# Patient Record
Sex: Male | Born: 1960
Health system: Southern US, Community
[De-identification: ages and names within clinical notes are randomized; demographics above are authoritative.]

## PROBLEM LIST (undated history)

## (undated) DIAGNOSIS — F524 Premature ejaculation: Secondary | ICD-10-CM

## (undated) DIAGNOSIS — D179 Benign lipomatous neoplasm, unspecified: Secondary | ICD-10-CM

## (undated) DIAGNOSIS — B009 Herpesviral infection, unspecified: Secondary | ICD-10-CM

## (undated) DIAGNOSIS — E559 Vitamin D deficiency, unspecified: Secondary | ICD-10-CM

## (undated) DIAGNOSIS — F845 Asperger's syndrome: Secondary | ICD-10-CM

## (undated) DIAGNOSIS — E785 Hyperlipidemia, unspecified: Secondary | ICD-10-CM

## (undated) DIAGNOSIS — M1990 Unspecified osteoarthritis, unspecified site: Secondary | ICD-10-CM

## (undated) DIAGNOSIS — H269 Unspecified cataract: Secondary | ICD-10-CM

## (undated) DIAGNOSIS — G4730 Sleep apnea, unspecified: Secondary | ICD-10-CM

## (undated) DIAGNOSIS — C61 Malignant neoplasm of prostate: Secondary | ICD-10-CM

## (undated) DIAGNOSIS — I2699 Other pulmonary embolism without acute cor pulmonale: Secondary | ICD-10-CM

## (undated) HISTORY — PX: CATARACT EXTRACTION, BILATERAL: SHX1313

## (undated) HISTORY — DX: Unspecified osteoarthritis, unspecified site: M19.90

## (undated) HISTORY — DX: Hyperlipidemia, unspecified: E78.5

## (undated) HISTORY — DX: Herpesviral infection, unspecified: B00.9

## (undated) HISTORY — DX: Asperger's syndrome: F84.5

## (undated) HISTORY — DX: Benign lipomatous neoplasm, unspecified: D17.9

## (undated) HISTORY — DX: Malignant neoplasm of prostate: C61

## (undated) HISTORY — DX: Sleep apnea, unspecified: G47.30

## (undated) HISTORY — DX: Unspecified cataract: H26.9

## (undated) HISTORY — DX: Other pulmonary embolism without acute cor pulmonale: I26.99

## (undated) HISTORY — DX: Vitamin D deficiency, unspecified: E55.9

## (undated) HISTORY — PX: EYE SURGERY: SHX253

## (undated) HISTORY — PX: PROSTATE SURGERY: SHX751

## (undated) HISTORY — PX: OTHER SURGICAL HISTORY: SHX169

## (undated) HISTORY — DX: Premature ejaculation: F52.4

## (undated) HISTORY — PX: JOINT REPLACEMENT: SHX530

## (undated) HISTORY — PX: SHOULDER OPEN ROTATOR CUFF REPAIR: SHX2407

---

## 2003-11-08 ENCOUNTER — Encounter: Admission: RE | Admit: 2003-11-08 | Discharge: 2003-11-08 | Payer: Self-pay | Admitting: Internal Medicine

## 2004-07-27 DIAGNOSIS — F524 Premature ejaculation: Secondary | ICD-10-CM

## 2004-07-27 HISTORY — DX: Premature ejaculation: F52.4

## 2009-04-29 DIAGNOSIS — I2699 Other pulmonary embolism without acute cor pulmonale: Secondary | ICD-10-CM

## 2009-04-29 HISTORY — DX: Other pulmonary embolism without acute cor pulmonale: I26.99

## 2009-05-21 ENCOUNTER — Encounter (INDEPENDENT_AMBULATORY_CARE_PROVIDER_SITE_OTHER): Payer: Self-pay | Admitting: Urology

## 2009-05-21 ENCOUNTER — Inpatient Hospital Stay (HOSPITAL_COMMUNITY): Admission: RE | Admit: 2009-05-21 | Discharge: 2009-05-22 | Payer: Self-pay | Admitting: Urology

## 2009-05-21 HISTORY — PX: XI ROBOTIC ASSISTED SIMPLE PROSTATECTOMY: SHX6713

## 2009-06-02 ENCOUNTER — Ambulatory Visit: Payer: Self-pay | Admitting: Family Medicine

## 2009-06-02 ENCOUNTER — Observation Stay (HOSPITAL_COMMUNITY): Admission: EM | Admit: 2009-06-02 | Discharge: 2009-06-03 | Payer: Self-pay | Admitting: Family Medicine

## 2009-06-02 ENCOUNTER — Encounter: Admission: RE | Admit: 2009-06-02 | Discharge: 2009-06-02 | Payer: Self-pay | Admitting: Emergency Medicine

## 2010-03-29 HISTORY — PX: OTHER SURGICAL HISTORY: SHX169

## 2010-04-13 LAB — HM COLONOSCOPY

## 2010-06-18 LAB — CBC
HCT: 40.8 % (ref 39.0–52.0)
Hemoglobin: 14.3 g/dL (ref 13.0–17.0)
MCHC: 34.9 g/dL (ref 30.0–36.0)
Platelets: 225 10*3/uL (ref 150–400)
RBC: 4.43 MIL/uL (ref 4.22–5.81)
RDW: 12.7 % (ref 11.5–15.5)

## 2010-06-18 LAB — BASIC METABOLIC PANEL
BUN: 11 mg/dL (ref 6–23)
GFR calc Af Amer: >60 mL/min (ref >60)
Potassium: 4.4 mEq/L (ref 3.5–5.1)
Sodium: 140 mEq/L (ref 135–145)

## 2010-06-18 LAB — TYPE AND SCREEN: ABO/RH(D): A POS

## 2010-06-18 LAB — HEMOGLOBIN AND HEMATOCRIT, BLOOD
HCT: 36.8 % — ABNORMAL LOW (ref 39.0–52.0)
Hemoglobin: 13 g/dL (ref 13.0–17.0)

## 2010-06-22 LAB — BASIC METABOLIC PANEL
CO2: 29 mEq/L (ref 19–32)
GFR calc Af Amer: >60 mL/min (ref >60)
Glucose, Bld: 134 mg/dL — ABNORMAL HIGH (ref 70–99)
Potassium: 3.9 mEq/L (ref 3.5–5.1)

## 2010-06-22 LAB — CBC
HCT: 38.2 % — ABNORMAL LOW (ref 39.0–52.0)
Hemoglobin: 13.1 g/dL (ref 13.0–17.0)
Hemoglobin: 14.2 g/dL (ref 13.0–17.0)
MCHC: 34.4 g/dL (ref 30.0–36.0)
MCV: 92.5 fL (ref 78.0–100.0)
MCV: 93 fL (ref 78.0–100.0)
RBC: 4.11 MIL/uL — ABNORMAL LOW (ref 4.22–5.81)
WBC: 7.4 10*3/uL (ref 4.0–10.5)

## 2010-06-22 LAB — PROTIME-INR
INR: 1.35 (ref 0.00–1.49)
Prothrombin Time: 14.4 seconds (ref 11.6–15.2)

## 2010-06-22 LAB — DIFFERENTIAL
Eosinophils Relative: 1 % (ref 0–5)
Lymphs Abs: 1.6 10*3/uL (ref 0.7–4.0)
Monocytes Relative: 6 % (ref 3–12)
Neutro Abs: 6.2 10*3/uL (ref 1.7–7.7)
Neutrophils Relative %: 73 % (ref 43–77)

## 2010-08-31 ENCOUNTER — Encounter (HOSPITAL_COMMUNITY)
Admission: RE | Admit: 2010-08-31 | Discharge: 2010-08-31 | Disposition: A | Discharge status: Home / Self Care | Payer: BC Managed Care – PPO | Source: Ambulatory Visit | Attending: Orthopedic Surgery | Admitting: Orthopedic Surgery

## 2010-08-31 ENCOUNTER — Ambulatory Visit (HOSPITAL_COMMUNITY)
Admission: RE | Admit: 2010-08-31 | Discharge: 2010-08-31 | Disposition: A | Discharge status: Home / Self Care | Payer: BC Managed Care – PPO | Source: Ambulatory Visit | Attending: Orthopedic Surgery | Admitting: Orthopedic Surgery

## 2010-08-31 ENCOUNTER — Other Ambulatory Visit (HOSPITAL_COMMUNITY): Payer: Self-pay | Admitting: Orthopedic Surgery

## 2010-08-31 DIAGNOSIS — M169 Osteoarthritis of hip, unspecified: Secondary | ICD-10-CM

## 2010-08-31 DIAGNOSIS — Z01818 Encounter for other preprocedural examination: Secondary | ICD-10-CM | POA: Insufficient documentation

## 2010-08-31 DIAGNOSIS — Z01812 Encounter for preprocedural laboratory examination: Secondary | ICD-10-CM | POA: Insufficient documentation

## 2010-08-31 DIAGNOSIS — M1710 Unilateral primary osteoarthritis, unspecified knee: Secondary | ICD-10-CM | POA: Insufficient documentation

## 2010-08-31 DIAGNOSIS — Z01810 Encounter for preprocedural cardiovascular examination: Secondary | ICD-10-CM | POA: Insufficient documentation

## 2010-08-31 DIAGNOSIS — IMO0002 Reserved for concepts with insufficient information to code with codable children: Secondary | ICD-10-CM | POA: Insufficient documentation

## 2010-08-31 LAB — URINALYSIS, ROUTINE W REFLEX MICROSCOPIC
Bilirubin Urine: NEGATIVE
Glucose, UA: NEGATIVE mg/dL
Nitrite: NEGATIVE
Protein, ur: NEGATIVE mg/dL
Urobilinogen, UA: 0.2 mg/dL (ref 0.0–1.0)

## 2010-08-31 LAB — BASIC METABOLIC PANEL
BUN: 14 mg/dL (ref 6–23)
CO2: 28 mEq/L (ref 19–32)
Calcium: 9.6 mg/dL (ref 8.4–10.5)
Creatinine, Ser: 0.83 mg/dL (ref 0.4–1.5)
GFR calc non Af Amer: >60 mL/min (ref >60)
Glucose, Bld: 96 mg/dL (ref 70–99)

## 2010-08-31 LAB — PROTIME-INR: Prothrombin Time: 13.9 seconds (ref 11.6–15.2)

## 2010-08-31 LAB — DIFFERENTIAL
Basophils Absolute: 0 10*3/uL (ref 0.0–0.1)
Basophils Relative: 0 % (ref 0–1)
Eosinophils Absolute: 0.1 10*3/uL (ref 0.0–0.7)
Lymphs Abs: 2.1 10*3/uL (ref 0.7–4.0)

## 2010-08-31 LAB — CBC
HCT: 43.6 % (ref 39.0–52.0)
Hemoglobin: 14.8 g/dL (ref 13.0–17.0)
MCH: 30.7 pg (ref 26.0–34.0)
MCHC: 33.9 g/dL (ref 30.0–36.0)
RBC: 4.82 MIL/uL (ref 4.22–5.81)
WBC: 7.2 10*3/uL (ref 4.0–10.5)

## 2010-08-31 LAB — SURGICAL PCR SCREEN: MRSA, PCR: NEGATIVE

## 2010-09-07 ENCOUNTER — Inpatient Hospital Stay (HOSPITAL_COMMUNITY)
Admission: RE | Admit: 2010-09-07 | Discharge: 2010-09-09 | DRG: 818 | Disposition: A | Discharge status: Home / Self Care | Payer: BC Managed Care – PPO | Source: Ambulatory Visit | Attending: Orthopedic Surgery | Admitting: Orthopedic Surgery

## 2010-09-07 ENCOUNTER — Inpatient Hospital Stay (HOSPITAL_COMMUNITY): Payer: BC Managed Care – PPO

## 2010-09-07 DIAGNOSIS — Z8546 Personal history of malignant neoplasm of prostate: Secondary | ICD-10-CM

## 2010-09-07 DIAGNOSIS — M1610 Unilateral primary osteoarthritis, unspecified hip: Principal | ICD-10-CM | POA: Diagnosis present

## 2010-09-07 DIAGNOSIS — M169 Osteoarthritis of hip, unspecified: Principal | ICD-10-CM | POA: Diagnosis present

## 2010-09-07 DIAGNOSIS — Z86711 Personal history of pulmonary embolism: Secondary | ICD-10-CM

## 2010-09-07 LAB — ABO/RH: ABO/RH(D): A POS

## 2010-09-07 LAB — TYPE AND SCREEN
ABO/RH(D): A POS
Antibody Screen: NEGATIVE

## 2010-09-07 LAB — GLUCOSE, CAPILLARY: Glucose-Capillary: 148 mg/dL — ABNORMAL HIGH (ref 70–99)

## 2010-09-08 LAB — CBC
HCT: 34.5 % — ABNORMAL LOW (ref 39.0–52.0)
Hemoglobin: 11.7 g/dL — ABNORMAL LOW (ref 13.0–17.0)
MCH: 31.2 pg (ref 26.0–34.0)
MCHC: 33.9 g/dL (ref 30.0–36.0)
RDW: 13.4 % (ref 11.5–15.5)

## 2010-09-08 LAB — BASIC METABOLIC PANEL
BUN: 8 mg/dL (ref 6–23)
Calcium: 8.5 mg/dL (ref 8.4–10.5)
GFR calc Af Amer: >60 mL/min (ref >60)
GFR calc non Af Amer: >60 mL/min (ref >60)
Glucose, Bld: 130 mg/dL — ABNORMAL HIGH (ref 70–99)
Potassium: 4.2 mEq/L (ref 3.5–5.1)
Sodium: 138 mEq/L (ref 135–145)

## 2010-09-08 NOTE — Op Note (Signed)
NAMEGARI, HARTSELL           ACCOUNT NO.:  [id number]  MEDICAL RECORD NO.:  [id number]  LOCATION:  5010                         FACILITY:  [facility]  PHYSICIAN:  Feliberto Gottron. Turner Daniels, M.D.   DATE OF BIRTH:  [birth year]  DATE OF PROCEDURE:  09/07/2010 DATE OF DISCHARGE:                              OPERATIVE REPORT   PREOPERATIVE DIAGNOSIS:  End-stage arthritis of the right hip, bone-on- bone.  POSTOPERATIVE DIAGNOSIS:  End-stage arthritis of the right hip, bone-on- bone.  PROCEDURE:  Right total hip arthroplasty using DePuy 58-mm Pinnacle cup, central occluder, 40-mm metal liner, 20 x 15 x 160 x 42 S-ROM stem, 78F XXL cone, +0 40-mm metal head.  SURGEON:  Feliberto Gottron. Turner Daniels, MD  FIRST ASSISTANT:  Shirl Harris, PA-C  ANESTHESIA:  General endotracheal.  ESTIMATED BLOOD LOSS:  600 mL.  FLUID REPLACEMENT:  2 L crystalloid.  DRAINS PLACED:  None.  TOURNIQUET TIME:  None.  INDICATIONS FOR PROCEDURE:  This is a 50 year old male with end-stage arthritis of the right hip who has failed conservative measures including anti-inflammatory medicines, judicious use of narcotics, physical therapy, and x-ray showed bone-on-bone arthritic changes.  He desires elective right total hip arthroplasty to decrease pain and increase function.  Risks and benefits of surgery have been discussed, questions answered.  DESCRIPTION OF PROCEDURE:  The patient identified by armband and received preoperative IV antibiotics in the holding area at the [facility].  He was taken to operating room 5, appropriate anesthetic monitors were attached, and general endotracheal anesthesia induced with the patient in a supine position.  He was then rolled into the left lateral decubitus position, fixed there with a Stulberg Mark II pelvic clamp and the right lower extremity prepped and draped in sterile fashion from the ankle to the hemipelvis.  Time-out procedure was performed.  Skin along the lateral hip  and thigh infiltrated with 20 mL of 0.5% Marcaine and epinephrine solution and 18-cm incision allowing posterolateral approach to the hip joint was made through skin and subcutaneous tissue down to the IT band which was then cut in line with the skin incision.  This exposed the greater trochanter and small bleeders were identified and cauterized.  Cobra retractors were placed between the gluteus minimus and the superior joint capsule and quadratus femoris and the inferior hip joint capsule.  This isolated the piriformis and short external rotators which were tagged with a #2 Ethibond suture and cut off their insertion on the intertrochanteric crest.  This exposed the hip joint capsule which was developed into an acetabular based flap going from posterior-superior out over the femoral neck and exiting posterior inferior.  This flap was tagged to #2 Ethibond sutures.  The hip was flexed and internally rotated allowing dislocation of the femoral head.  Standard neck cut was performed 1 fingerbreadth above the lesser trochanter and small bleeders again identified and cauterized.  The proximal femur was then translated anteriorly levering off the anterior column with a Hohmann retractor.  A spike Cobra was placed in the cotyloid notch and a posterior inferior wing retractor placed at the junction of the ischium and the acetabulum. This allowed [place] to perform a complete labrectomy.  We then carefully  examined the acetabulum and reamed up to a 57-mm basket reamer obtaining good coverage in all quadrants.  We then selected a 58-mm Pinnacle cup after irrigating the acetabulum and hammered into place at 40 degrees of abduction and about 20 degrees of anteversion.  A central occluder was placed followed by a 40-mm metal liner.  At this point, the hip was flexed and internally rotated exposing the proximal femur which was entered with a box-cutting chisel followed by the initiating reamer and then  axial reamer, reaming starting at 10 going up to 14 mm in 1-mm increments where we just got a little bit of chatter.  We then reamed up to 15.5 obtaining good cortical chatter to full depth for the reamer and reamed partway down the 16-mm reamer.  We then conically reamed up to a 1F cone to the appropriate depth for a 42-mm base neck.  The calcar was then milled to an XXL calcar and a trial 60F XXL cone was hammered into place followed by a trial stem with a 42 base neck and same version of the calcar which was about 20 degrees.  A +0 40-mm trial head was then inserted.  The hip reduced and stability to 90 with 70 of internal rotation noted and an external rotation in extension and the external rotation to about 40 degrees, the hip could not be dislocated.  At this point, the trial components were removed.  A 60F XXL ZTT cone was hammered into place.  We then reamed again a 15.5 and a 20 x 15 x 42 x 160 S-ROM stem was hammered into place in the same version as the calcar and after it had seated, a +0 40-mm metal head was hammered onto the stem.  The hip was reduced, stability checked, and found to be excellent.  Bleeders were once again checked and the wound irrigated out with normal saline solution.  The capsular flap and short external rotators were repaired back to the intertrochanteric crest through drill holes.  The IT band was closed with running #1 Vicryl suture, the subcutaneous tissue with 0 and 2-0 undyed Vicryl suture and the skin with running interlocking 3-0 nylon suture.  A dressing of Xeroform and Mepilex was applied.  The patient was unclamped, rolled supine, awakened, extubated, and taken to the recovery room without difficulty.     Feliberto Gottron. Turner Daniels, M.D.     Ovid Curd  D:  09/07/2010  T:  09/08/2010  Job:  [id number]  Electronically Signed by Gean Birchwood M.D. on 09/08/2010 09:43:36 PM

## 2010-09-09 LAB — CBC
HCT: 32.4 % — ABNORMAL LOW (ref 39.0–52.0)
MCHC: 33.3 g/dL (ref 30.0–36.0)
MCV: 92.8 fL (ref 78.0–100.0)
Platelets: 129 10*3/uL — ABNORMAL LOW (ref 150–400)
RDW: 13.5 % (ref 11.5–15.5)

## 2011-01-05 LAB — BASIC METABOLIC PANEL: Creatinine: 0.8 mg/dL (ref <=1.3)

## 2011-01-12 LAB — BASIC METABOLIC PANEL WITH GFR
BUN: 15 mg/dL (ref 4–21)
Glucose: 87 mg/dL
Potassium: 4.1 mmol/L (ref 3.4–5.3)
Sodium: 138 mmol/L (ref 137–147)

## 2011-01-12 LAB — LIPID PANEL
Cholesterol: 261 mg/dL — AB (ref 0–200)
HDL: 43 mg/dL (ref 35–70)
LDL Cholesterol: 190 mg/dL
LDl/HDL Ratio: 6.1
Triglycerides: 140 mg/dL (ref 40–160)

## 2011-01-12 LAB — HEPATIC FUNCTION PANEL
ALT: 17 U/L (ref 10–40)
AST: 18 U/L (ref 14–40)
Alkaline Phosphatase: 70 U/L (ref 25–125)
Bilirubin, Total: 0.6 mg/dL

## 2011-01-12 LAB — CBC AND DIFFERENTIAL
HCT: 43 % (ref 41–53)
Hemoglobin: 14.3 g/dL (ref 13.5–17.5)
Platelets: 181 K/µL (ref 150–399)
WBC: 6.5 10*3/mL

## 2011-01-12 LAB — TSH: TSH: 3.29 u[IU]/mL (ref <=5.90)

## 2011-04-13 ENCOUNTER — Encounter: Payer: Self-pay | Admitting: *Deleted

## 2011-04-13 DIAGNOSIS — B009 Herpesviral infection, unspecified: Secondary | ICD-10-CM | POA: Insufficient documentation

## 2011-04-13 DIAGNOSIS — E785 Hyperlipidemia, unspecified: Secondary | ICD-10-CM | POA: Insufficient documentation

## 2011-04-13 DIAGNOSIS — E559 Vitamin D deficiency, unspecified: Secondary | ICD-10-CM | POA: Insufficient documentation

## 2011-05-11 ENCOUNTER — Ambulatory Visit (INDEPENDENT_AMBULATORY_CARE_PROVIDER_SITE_OTHER): Payer: BC Managed Care – PPO | Admitting: Emergency Medicine

## 2011-05-11 ENCOUNTER — Encounter: Payer: Self-pay | Admitting: Emergency Medicine

## 2011-05-11 VITALS — BP 121/72 | HR 69 | Temp 98.5°F | Resp 17 | Ht 74.0 in | Wt 265.6 lb

## 2011-05-11 DIAGNOSIS — E785 Hyperlipidemia, unspecified: Secondary | ICD-10-CM

## 2011-05-11 DIAGNOSIS — E782 Mixed hyperlipidemia: Secondary | ICD-10-CM

## 2011-05-11 LAB — LIPID PANEL
HDL: 38 mg/dL — ABNORMAL LOW (ref >39)
LDL Cholesterol: 137 mg/dL — ABNORMAL HIGH (ref 0–99)

## 2011-05-11 NOTE — Progress Notes (Signed)
  Subjective:    Patient ID: Scott [name], male    DOB: 06-30-1960, 51 y.o.   MRN: [id number]  HPI patient enters for recheck on his cholesterol his last cholesterol is elevated despite being on Crestor. We had him continue to take his Crestor on a daily basis. He exercises every day for about 50 minutes at school. He has no complaints of chest pain shortness of breath or any other problems. He's had a lot of difficulty with his mother and his children over some issues that arose at Christmas.    Review of Systems he denies chest pain shortness of breath problems.     Objective:   Physical Exam HEENT exam is normal. Chest is clear. Heart regular rate no murmurs.        Assessment & Plan:  Assessment is hyperlipidemia not adequately controlled on current dose of Crestor. See what his level is and if not at goal will increase his Crestor dosage. He will continue his exercise program and low fat diet

## 2011-09-14 ENCOUNTER — Encounter: Payer: Self-pay | Admitting: Emergency Medicine

## 2011-09-14 ENCOUNTER — Ambulatory Visit (INDEPENDENT_AMBULATORY_CARE_PROVIDER_SITE_OTHER): Payer: BC Managed Care – PPO | Admitting: Emergency Medicine

## 2011-09-14 VITALS — BP 123/73 | HR 70 | Temp 98.3°F | Resp 16 | Ht 74.0 in | Wt 268.0 lb

## 2011-09-14 DIAGNOSIS — E785 Hyperlipidemia, unspecified: Secondary | ICD-10-CM

## 2011-09-14 DIAGNOSIS — M5410 Radiculopathy, site unspecified: Secondary | ICD-10-CM

## 2011-09-14 MED ORDER — GABAPENTIN 300 MG PO CAPS: ORAL_CAPSULE | ORAL | Status: DC

## 2011-09-14 NOTE — Progress Notes (Signed)
  Subjective:    Patient ID: Scott [name], male    DOB: Aug 03, 1960, 51 y.o.   MRN: [id number]  HPI patient here followup on his high cholesterol. He is currently on Crestor daily. He also has a history of depression on Zoloft. He also recently injured his right great toe and developed a large subungual hematoma.    Review of Systems he also recently has had trouble with left side of his neck. He has had pain shooting down his left arm. He was told by his orthopedist he had a pinched nerve in his neck.     Objective:   Physical Exam chest is clear heart rate no murmurs. Abdomen soft nontender. There is a large subungual hematoma of the right great toe. A 20-gauge needle was used and an opening made in the nail plate. A large amount of blood and fluid was expressed.   Physical exam of the upper extremities. Deep tendon reflexes 2+. Motor strength 5 out of 5 in all muscle groups.     Assessment & Plan:  Do well on current dose of Crestor. I did in his right great nail to relieve the subungual hematoma. He will recheck in 3-4 months to recheck his cholesterol. He also has radiculopathy in his left arm. We'll try Neurontin 300 mg at bedtime. I also gave him Dr. Genene Churn number to call to see if he would benefit from cervical traction.

## 2011-09-15 LAB — LIPID PANEL
Cholesterol: 221 mg/dL — ABNORMAL HIGH (ref 0–200)
Total CHOL/HDL Ratio: 6.1 Ratio
Triglycerides: 194 mg/dL — ABNORMAL HIGH (ref <150)

## 2011-09-18 ENCOUNTER — Other Ambulatory Visit: Payer: Self-pay | Admitting: Emergency Medicine

## 2011-10-22 ENCOUNTER — Ambulatory Visit: Payer: BC Managed Care – PPO

## 2011-10-22 ENCOUNTER — Ambulatory Visit (INDEPENDENT_AMBULATORY_CARE_PROVIDER_SITE_OTHER): Payer: BC Managed Care – PPO | Admitting: Emergency Medicine

## 2011-10-22 VITALS — BP 132/74 | HR 72 | Temp 98.7°F | Resp 16 | Ht 75.75 in | Wt 268.0 lb

## 2011-10-22 DIAGNOSIS — S90212A Contusion of left great toe with damage to nail, initial encounter: Secondary | ICD-10-CM

## 2011-10-22 DIAGNOSIS — S90129A Contusion of unspecified lesser toe(s) without damage to nail, initial encounter: Secondary | ICD-10-CM

## 2011-10-22 MED ORDER — HYDROCODONE-ACETAMINOPHEN 5-325 MG PO TABS: 1.0000 | ORAL_TABLET | Freq: Four times a day (QID) | ORAL | Status: AC | PRN

## 2011-10-22 MED ORDER — DOXYCYCLINE HYCLATE 100 MG PO TABS: 100.0000 mg | ORAL_TABLET | Freq: Two times a day (BID) | ORAL | Status: AC

## 2011-10-22 MED ORDER — MUPIROCIN CALCIUM 2 % EX CREA: TOPICAL_CREAM | Freq: Three times a day (TID) | CUTANEOUS | Status: AC

## 2011-10-22 NOTE — Patient Instructions (Addendum)
Clean area with soap and water twice a day. Apply cream twice a day. Recheck in 3-4 days if not improving.

## 2011-10-22 NOTE — Progress Notes (Signed)
  Subjective:    Patient ID: Scott [name], male    DOB: Nov 25, 1960, 51 y.o.   MRN: [id number]  HPI initial injury to the great toes approximately 5 weeks ago. He developed a subungual hematoma which was drained with a 20-gauge needle. Over the last few days he has developed increased redness at the base of the nail.    Review of Systems     Objective:   Physical Exam only a small portion of the nail is still attached. After numbing with topical this area was removed. There is a new nail forming in this area . There is redness at the base of the nail matrix. The skin here is peeling    [facility] reading (PRIMARY) by  Dr Cleta Alberts there may be a tiny tuft fracture.        Assessment & Plan:  Patient appears to be developing a paronychia at the base of the nail. The nail plate was removed treat with topical appears thinned and doxycycline. He was given some hydrocodone for pain if needed

## 2012-01-31 ENCOUNTER — Other Ambulatory Visit: Payer: Self-pay | Admitting: Emergency Medicine

## 2012-02-09 ENCOUNTER — Other Ambulatory Visit: Payer: Self-pay | Admitting: *Deleted

## 2012-02-09 MED ORDER — ROSUVASTATIN CALCIUM 20 MG PO TABS: 20.0000 mg | ORAL_TABLET | Freq: Every day | ORAL | Status: DC

## 2012-04-17 ENCOUNTER — Other Ambulatory Visit: Payer: Self-pay | Admitting: Emergency Medicine

## 2012-05-15 ENCOUNTER — Other Ambulatory Visit: Payer: Self-pay | Admitting: *Deleted

## 2012-05-15 MED ORDER — ROSUVASTATIN CALCIUM 20 MG PO TABS: 20.0000 mg | ORAL_TABLET | Freq: Every day | ORAL | Status: DC

## 2012-06-19 ENCOUNTER — Encounter: Payer: Self-pay | Admitting: Emergency Medicine

## 2012-06-19 ENCOUNTER — Ambulatory Visit (INDEPENDENT_AMBULATORY_CARE_PROVIDER_SITE_OTHER): Payer: BC Managed Care – PPO | Admitting: Emergency Medicine

## 2012-06-19 VITALS — BP 120/80 | HR 86 | Temp 98.0°F | Resp 16 | Ht 75.0 in | Wt 265.0 lb

## 2012-06-19 DIAGNOSIS — E782 Mixed hyperlipidemia: Secondary | ICD-10-CM

## 2012-06-19 LAB — COMPREHENSIVE METABOLIC PANEL
ALT: 20 U/L (ref 0–53)
AST: 19 U/L (ref 0–37)
Alkaline Phosphatase: 72 U/L (ref 39–117)
CO2: 29 mEq/L (ref 19–32)
Sodium: 139 mEq/L (ref 135–145)
Total Bilirubin: 0.6 mg/dL (ref 0.3–1.2)
Total Protein: 7.1 g/dL (ref 6.0–8.3)

## 2012-06-19 LAB — POCT URINALYSIS DIPSTICK
Ketones, UA: NEGATIVE
Leukocytes, UA: NEGATIVE
Nitrite, UA: NEGATIVE
Protein, UA: NEGATIVE
Urobilinogen, UA: 0.2

## 2012-06-19 LAB — POCT CBC
Granulocyte percent: 63.5 %G (ref 37–80)
Hemoglobin: 14.2 g/dL (ref 14.1–18.1)
Lymph, poc: 1.6 (ref 0.6–3.4)
MCHC: 32.3 g/dL (ref 31.8–35.4)
MPV: 7.6 fL (ref 0–99.8)
POC Granulocyte: 3.6 (ref 2–6.9)
POC MID %: 7.7 %M (ref 0–12)
RDW, POC: 13.9 %

## 2012-06-19 LAB — LIPID PANEL
LDL Cholesterol: 161 mg/dL — ABNORMAL HIGH (ref 0–99)
Total CHOL/HDL Ratio: 5.6 Ratio
VLDL: 31 mg/dL (ref 0–40)

## 2012-06-19 LAB — TSH: TSH: 2.195 u[IU]/mL (ref 0.350–4.500)

## 2012-06-19 NOTE — Progress Notes (Addendum)
[facility] [address] [place] [address] [phone]  Date:  06/19/2012   Name:  Scott [name]   DOB:  June 09, 1960   MRN:  [id number]  PCP:  Lucilla Edin, MD    Chief Complaint: Medication Refill   History of Present Illness:  Scott [name] is a 52 y.o. very pleasant male patient who presents with the following:  For labs and crestor refill.  Tolerating medication well and no adverse effects.  Had recent PSA measured 0.00.  Denies other complaint or health concern today.  Up to date on immunizations.  Non smoker   Patient Active Problem List  Diagnosis  . Hyperlipidemia  . Unspecified vitamin D deficiency  . Herpes simplex type II infection    Past Medical History  Diagnosis Date  . Hyperlipidemia   . Premature ejaculation     07/2004  . Unspecified vitamin D deficiency   . Multiple lipomas   . Malignant neoplasm of prostate   . Pulmonary embolism     04/2009  . Asperger's syndrome     symptoms of  . Herpes simplex type II infection     Past Surgical History  Procedure Laterality Date  . Hip repalcement      2012  . Robotic prostatectomy      05/21/2009  . Shoulder open rotator [name] repair      History  Substance Use Topics  . Smoking status: Former Smoker -- 1.00 packs/day    Types: Cigarettes    Quit date: 03/29/2002  . Smokeless tobacco: Not on file  . Alcohol Use: 1.5 - 2.5 oz/week    3-5 drink(s) per week    Family History  Problem Relation Age of Onset  . Heart disease    . Hyperlipidemia    . Diabetes      ?  Marland Kitchen Diabetes Mother   . Hypertension Mother   . Rheum arthritis Mother   . Cancer Father   . Heart failure Father     "heart trouble"    No Known Allergies  Medication list has been reviewed and updated.  Current Outpatient Prescriptions on File Prior to Visit  Medication Sig Dispense Refill  . rosuvastatin (CRESTOR) 20 MG tablet Take 1 tablet (20 mg total) by mouth daily. Needs office  visit before anymore refills  15 tablet  0  . sertraline (ZOLOFT) 100 MG tablet TAKE 1 TABLET BY MOUTH EVERY DAY  30 tablet  PRN  . tadalafil (CIALIS) 5 MG tablet Take 5 mg by mouth daily as needed.      . valACYclovir (VALTREX) 500 MG tablet TAKE 1 TABLET BY MOUTH EVERY DAY  30 tablet  PRN  . cholecalciferol (VITAMIN D) 1000 UNITS tablet Take 1,000 Units by mouth daily.      Marland Kitchen gabapentin (NEURONTIN) 300 MG capsule Take one capsule at bedtime  30 capsule  3  . meloxicam (MOBIC) 15 MG tablet Take 15 mg by mouth daily.       No current facility-administered medications on file prior to visit.    Review of Systems:  As per HPI, otherwise negative.    Physical Examination: Filed Vitals:   06/19/12 0831  BP: 120/80  Pulse: 86  Temp: 98 F (36.7 C)  Resp: 16   Filed Vitals:   06/19/12 0831  Height: 6\' 3"  (1.905 m)  Weight: 265 lb (120.203 kg)   Body mass index is 33.12 kg/(m^2). Ideal Body Weight: Weight in (  lb) to have BMI = 25: 199.6  GEN: WDWN, NAD, Non-toxic, A & O x 3 HEENT: Atraumatic, Normocephalic. Neck supple. No masses, No LAD. Ears and Nose: No external deformity. CV: RRR, No M/G/R. No JVD. No thrill. No extra heart sounds. PULM: CTA B, no wheezes, crackles, rhonchi. No retractions. No resp. distress. No accessory muscle use. ABD: S, NT, ND, +BS. No rebound. No HSM. EXTR: No c/c/e NEURO Normal gait.  PSYCH: Normally interactive. Conversant. Not depressed or anxious appearing.  Calm demeanor.  RECTAL by urologist   Assessment and Plan: Hyperlipidemia Erectile dysfunction Prostate cancer by history   Signed,  Phillips Odor, MD

## 2012-06-20 ENCOUNTER — Telehealth: Payer: Self-pay

## 2012-06-20 LAB — VITAMIN D 25 HYDROXY (VIT D DEFICIENCY, FRACTURES): Vit D, 25-Hydroxy: 25 ng/mL — ABNORMAL LOW (ref 30–89)

## 2012-06-20 MED ORDER — ROSUVASTATIN CALCIUM 40 MG PO TABS: 40.0000 mg | ORAL_TABLET | Freq: Every day | ORAL | Status: DC

## 2012-06-20 MED ORDER — CHOLECALCIFEROL 1.25 MG (50000 UT) PO TABS: 1.0000 | ORAL_TABLET | ORAL | Status: DC

## 2012-06-20 NOTE — Addendum Note (Signed)
Addended by: Carmelina Dane on: 06/20/2012 06:43 PM   Modules accepted: Orders

## 2012-06-20 NOTE — Telephone Encounter (Signed)
Spoke with pt, he needs a refill on his Crestor. Can you review his labs and let me know if I can send this in. Please advise.

## 2012-06-20 NOTE — Addendum Note (Signed)
Addended by: Carmelina Dane on: 06/20/2012 06:45 PM   Modules accepted: Orders

## 2013-12-16 ENCOUNTER — Ambulatory Visit (INDEPENDENT_AMBULATORY_CARE_PROVIDER_SITE_OTHER): Payer: BC Managed Care – PPO | Admitting: Emergency Medicine

## 2013-12-16 VITALS — BP 120/76 | HR 61 | Temp 98.2°F | Resp 12 | Ht 74.5 in | Wt 252.0 lb

## 2013-12-16 DIAGNOSIS — Z7189 Other specified counseling: Secondary | ICD-10-CM

## 2013-12-16 DIAGNOSIS — R4589 Other symptoms and signs involving emotional state: Secondary | ICD-10-CM

## 2013-12-16 DIAGNOSIS — F3289 Other specified depressive episodes: Secondary | ICD-10-CM

## 2013-12-16 DIAGNOSIS — F329 Major depressive disorder, single episode, unspecified: Secondary | ICD-10-CM

## 2013-12-16 DIAGNOSIS — F32A Depression, unspecified: Secondary | ICD-10-CM

## 2013-12-16 DIAGNOSIS — R454 Irritability and anger: Secondary | ICD-10-CM

## 2013-12-16 DIAGNOSIS — Z630 Problems in relationship with spouse or partner: Secondary | ICD-10-CM

## 2013-12-16 MED ORDER — SERTRALINE HCL 100 MG PO TABS: ORAL_TABLET | ORAL | Status: DC

## 2013-12-16 MED ORDER — VALACYCLOVIR HCL 500 MG PO TABS: 500.0000 mg | ORAL_TABLET | Freq: Every day | ORAL | Status: DC

## 2013-12-16 NOTE — Progress Notes (Signed)
   Subjective:    Patient ID: Scott [name], male    DOB: 03/09/1961, 53 y.o.   MRN: [id number]  HPI 53 y.o. Male presents to clinic for a medication refill valtrex and zoloft. Reports that he has been dealing with some family issues and feels he needs a strong antidepressant. Reports that he has taken antidepressants before, but states that wife made him quit because it made him to lethargic. Reports that it has been a few years. Has noticed himself lashing out; feels that everything has just hit him the wrong way. Denies ever being threatning to his wife. Denies any feelings of being suicidal. Has seen Scott [name] in the past for these problems; has taken zoloft in the past. Reports that he was told he may have a trace of Asperger's.  Reports that he is a drinker and drinks a few a night. Reports never thinking he was a alcoholic. Denies drinking in the morning; states that it bothers him when his wife brings up his drinking.Reports that it doesn't bother him to not have alcohol. Feels like he is sixth or seventh on the totum pole at home with his wife behind her children.  Review of Systems     Objective:   Physical Exam  Constitutional: He is oriented to person, place, and time. He appears well-developed and well-nourished.  Eyes: Pupils are equal, round, and reactive to light.  Neck: No thyromegaly present.  Cardiovascular: Normal rate and regular rhythm.   Pulmonary/Chest: Effort normal and breath sounds normal. No respiratory distress. He has no wheezes.  Abdominal: Soft. Bowel sounds are normal. There is no tenderness.  Musculoskeletal: Normal range of motion.  Neurological: He is alert and oriented to person, place, and time. No cranial nerve deficit.  Psychiatric: He has a normal mood and affect. His behavior is normal. Judgment and thought content normal.   Beck depression 15 Meds ordered this encounter  Medications  . valACYclovir (VALTREX) 500 MG tablet    Sig: Take 1  tablet (500 mg total) by mouth daily.    Dispense:  30 tablet    Refill:  11  . sertraline (ZOLOFT) 100 MG tablet    Sig: Take one half tablet daily for one week then one tablet daily    Dispense:  30 tablet    Refill:  11        Assessment & Plan:  Patient having diffcult with his marriage, will resume zoloft he has taken in the past. Make referall to Scott stewart for counseling. Recheck in three weeks.

## 2014-03-05 ENCOUNTER — Ambulatory Visit (INDEPENDENT_AMBULATORY_CARE_PROVIDER_SITE_OTHER): Payer: BC Managed Care – PPO | Admitting: Emergency Medicine

## 2014-03-05 ENCOUNTER — Encounter: Payer: Self-pay | Admitting: Emergency Medicine

## 2014-03-05 ENCOUNTER — Ambulatory Visit (INDEPENDENT_AMBULATORY_CARE_PROVIDER_SITE_OTHER): Payer: BC Managed Care – PPO

## 2014-03-05 VITALS — BP 140/80 | HR 84 | Temp 98.6°F | Resp 16 | Ht 74.5 in | Wt 264.0 lb

## 2014-03-05 DIAGNOSIS — L989 Disorder of the skin and subcutaneous tissue, unspecified: Secondary | ICD-10-CM

## 2014-03-05 DIAGNOSIS — N529 Male erectile dysfunction, unspecified: Secondary | ICD-10-CM

## 2014-03-05 DIAGNOSIS — M1612 Unilateral primary osteoarthritis, left hip: Secondary | ICD-10-CM

## 2014-03-05 DIAGNOSIS — M25552 Pain in left hip: Secondary | ICD-10-CM

## 2014-03-05 DIAGNOSIS — E785 Hyperlipidemia, unspecified: Secondary | ICD-10-CM

## 2014-03-05 DIAGNOSIS — Z0000 Encounter for general adult medical examination without abnormal findings: Secondary | ICD-10-CM

## 2014-03-05 LAB — POCT URINALYSIS DIPSTICK
Bilirubin, UA: NEGATIVE
Blood, UA: NEGATIVE
Glucose, UA: NEGATIVE
Ketones, UA: NEGATIVE
Leukocytes, UA: NEGATIVE
NITRITE UA: NEGATIVE
PROTEIN UA: NEGATIVE
SPEC GRAV UA: 1.015
UROBILINOGEN UA: 1
pH, UA: 5.5

## 2014-03-05 MED ORDER — TADALAFIL 5 MG PO TABS
5.0000 mg | ORAL_TABLET | Freq: Every day | ORAL | Status: DC | PRN
Start: 2014-03-05 — End: 2020-02-04

## 2014-03-05 MED ORDER — ROSUVASTATIN CALCIUM 40 MG PO TABS: 40.0000 mg | ORAL_TABLET | Freq: Every day | ORAL | Status: DC

## 2014-03-05 NOTE — Progress Notes (Addendum)
MRN: [id number]  Subjective:   [name] is a 53 y.o. male presenting for annual physical exam and hip pain.   Medical care team includes:  PCP: Jenny Reichmann, MD, last visit 11/2013 Vision: Cannot recall ophtalmologist, last seen 09/2013, had bilateral cataract surgery 06-09/2013, has done well since Dental: Cleanings biannually, last seen 2 weeks ago, no new significant findings. Specialists:   Colonoscopy: Dr. Benson Norway, done in 2010, no polyps, 10 year follow up.  Urologist, Dr. Alinda Money: last seen 08/2013, PSA was undetectable then, follow up in 04/2014, if PSA is normal, will stop rechecks until 5 years.  [name] has Hyperlipidemia; Unspecified vitamin D deficiency; Herpes simplex type II infection; and Anger on his problem list.  Mood - started Zoloft 11/2013, seeing Vivia Budge, therapist in addition to antidepressant medication. Patient reports significant improvement in his mood, feels that Zoloft is working for him. States that he is getting along better with his wife, no HI or SI.   HL - ran out Crestor a couple of weeks ago. Reports compliance until then. Needs a refill today. Diet is okay, dinner is very healthy, lunch consists of unhealthy meals, burgers, sometimes salads. Goes to gym 4-5 times a week for at least 45 minutes.  Herpes - had an outbreak ~1 month ago, took Valtrex, shortened outbreak to ~1.5 days. Patient rarely has outbreaks, reports that Valtrex helps resolve symptoms quickly.  ED - taking Cialis, uses this infrequently but works well for patient. Would like a refill today.  Hip pain - reports several week history of new onset intermittent left sided sharp hip pain, moderate in severity, non-radiating, lasting <10 minutes, worse with increased activity including work and exercise. Previously had right hip replacement (2012), left hip pain feels exactly same as previous right hip pain.   Health Maintenance: flu vaccine 11/2013,  last tetanus 2008. 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 States that he had blood drawn today as part of faculty wellness program, would like to fax those results instead of having labs redrawn.   Denies any other questions or concerns.  Current medications include: Zoloft 100mg  daily Cialis 5mg  PRN Valtrex 500mg  TID PRN  [name] has No Known Allergies.   Past Medical History  Diagnosis Date  . Hyperlipidemia   . Premature ejaculation     07/2004  . Unspecified vitamin D deficiency   . Multiple lipomas   . Malignant neoplasm of prostate   . Pulmonary embolism     04/2009  . Asperger's syndrome     symptoms of  . Herpes simplex type II infection     Past Surgical History  Procedure Laterality Date  . Hip repalcement      2012  . Robotic prostatectomy      05/21/2009  . Shoulder open rotator cuff repair    . Cataract extraction, bilateral      Review of Systems  Constitutional: Negative for fever, chills and weight loss.  HENT: Negative for sore throat.   Eyes: Negative for double vision.  Respiratory: Negative for cough, shortness of breath and wheezing.   Cardiovascular: Negative for chest pain, palpitations, leg swelling and PND.  Gastrointestinal: Negative for nausea, vomiting, abdominal pain, diarrhea, constipation and blood in stool.  Genitourinary: Negative for dysuria, hematuria and flank pain.  Musculoskeletal: Positive for joint pain (as in HPI). Negative for myalgias and back pain.  Skin: Negative for rash.  Neurological: Negative for dizziness  and headaches.  Psychiatric/Behavioral: Negative for depression. The patient does not have insomnia.     Objective:   Vitals: BP 140/80 mmHg  Pulse 84  Temp(Src) 98.6 F (37 C) (Oral)  Resp 16  Ht 6' 2.5" (1.892 m)  Wt 264 lb (119.75 kg)  BMI 33.45 kg/m2  SpO2 94%  Wt Readings from Last 3 Encounters:  03/05/14 264 lb (119.75 kg)  12/16/13 252 lb (114.306 kg)  06/19/12  265 lb (120.203 kg)   Physical Exam  Constitutional: He is oriented to person, place, and time and well-developed, well-nourished, and in no distress.  HENT:  Right Ear: Tympanic membrane, external ear and ear canal normal.  Left Ear: Tympanic membrane, external ear and ear canal normal.  Nose: Nose normal. No sinus tenderness.  Mouth/Throat: Oropharynx is clear and moist. No oropharyngeal exudate.  Eyes: Conjunctivae and EOM are normal. Pupils are equal, round, and reactive to light. No scleral icterus.  Neck: Normal range of motion. Neck supple. No thyromegaly present.  Cardiovascular: Normal rate, regular rhythm, normal heart sounds and intact distal pulses.  Exam reveals no gallop and no friction rub.   No murmur heard. Pulmonary/Chest: Effort normal and breath sounds normal. No stridor. No respiratory distress. He has no wheezes. He has no rales.  Abdominal: Soft. Bowel sounds are normal. He exhibits no distension and no mass. There is no tenderness.  Genitourinary:  Patient declined, followed by Urologist, Dr. Alinda Money  Musculoskeletal: He exhibits no edema or tenderness.       Left hip: He exhibits decreased range of motion (external rotation). He exhibits normal strength, no tenderness, no bony tenderness, no swelling, no crepitus and no deformity.  Lymphadenopathy:    He has no cervical adenopathy.  Neurological: He is alert and oriented to person, place, and time. He has normal reflexes.  Skin: Skin is warm and dry. Lesion (macular pigmented lesion with irregular borders ~0.5cm in size at angle of right jaw) noted. No rash noted. He is not diaphoretic. No erythema.  Psychiatric: Mood and affect normal.   [facility] reading (PRIMARY) by  Dr. Everlene Farrier and PA-Mani. Left Hip X-ray: significant degenerative changes, loss of joint space. No evidence of fracture or dislocation.  Results for orders placed or performed in visit on 03/05/14 (from the past 24 hour(s))  POCT urinalysis dipstick      Status: None   Collection Time: 03/05/14  4:04 PM  Result Value Ref Range   Color, UA Yellow    Clarity, UA Clear    Glucose, UA Negative    Bilirubin, UA Negative    Ketones, UA Negative    Spec Grav, UA 1.015    Blood, UA Negative    pH, UA 5.5    Protein, UA Negative    Urobilinogen, UA 1.0    Nitrite, UA Negative    Leukocytes, UA Negative     Assessment and Plan :   1. Annual physical exam - Medically stable, patient to send lab results this week - Discussed healthy lifestyle, diet, exercise, preventative care, vaccinations, and addressed patient's concerns - Plan for follow up in 1 year for annual exam.  - POCT urinalysis dipstick  2. Osteoarthritis of left hip, unspecified osteoarthritis type 3. Left hip pain - Ambulatory referral to Orthopedic Surgery - DG Hip Complete Left with significant degenerative changes - f/u as needed following referral completed  4. Hyperlipidemia - stable, labs to be sent by patient - restart rosuvastatin (CRESTOR) 40 MG tablet; Take 1 tablet (  40 mg total) by mouth daily.  5. Erectile dysfunction, unspecified erectile dysfunction type - stable, refill tadalafil (CIALIS) 5 MG tablet; Take 1 tablet (5 mg total) by mouth daily as needed.  Dispense: 10 tablet; Refill: 11  6. Skin lesion of face - Ambulatory referral to Dermatology for further evaluation - f/u as needed after referral completed  7. History of prostate cancer - Continue f/u with Urology   Jaynee Eagles, PA-C [facility] [phone] 03/05/2014 5:02 PM

## 2014-03-05 NOTE — Patient Instructions (Signed)
Please remember to fax the results for labs drawn through your faculty wellness program at [facility].  You should be getting a call regarding the referral to [employer]. Please let us know if they do not contact you in the next 2 weeks.   Osteoarthritis Osteoarthritis is a disease that causes soreness and inflammation of a joint. It occurs when the cartilage at the affected joint wears down. Cartilage acts as a cushion, covering the ends of bones where they meet to form a joint. Osteoarthritis is the most common form of arthritis. It often occurs in older people. The joints affected most often by this condition include those in the:  Ends of the fingers.  Thumbs.  Neck.  Lower back.  Knees.  Hips. CAUSES  Over time, the cartilage that covers the ends of bones begins to wear away. This causes bone to rub on bone, producing pain and stiffness in the affected joints.  RISK FACTORS Certain factors can increase your chances of having osteoarthritis, including:  Older age.  Excessive body weight.  Overuse of joints.  Previous joint injury. SIGNS AND SYMPTOMS   Pain, swelling, and stiffness in the joint.  Over time, the joint may lose its normal shape.  Small deposits of bone (osteophytes) may grow on the edges of the joint.  Bits of bone or cartilage can break off and float inside the joint space. This may cause more pain and damage. DIAGNOSIS  Your health care provider will do a physical exam and ask about your symptoms. Various tests may be ordered, such as:  X-rays of the affected joint.  An MRI scan.  Blood tests to rule out other types of arthritis.  Joint fluid tests. This involves using a needle to draw fluid from the joint and examining the fluid under a microscope. TREATMENT  Goals of treatment are to control pain and improve joint function. Treatment plans may include:  A prescribed exercise program that allows for rest and joint relief.  A weight  control plan.  Pain relief techniques, such as:  Properly applied heat and cold.  Electric pulses delivered to nerve endings under the skin (transcutaneous electrical nerve stimulation [TENS]).  Massage.  Certain nutritional supplements.  Medicines to control pain, such as:  Acetaminophen.  Nonsteroidal anti-inflammatory drugs (NSAIDs), such as naproxen.  Narcotic or central-acting agents, such as tramadol.  Corticosteroids. These can be given orally or as an injection.  Surgery to reposition the bones and relieve pain (osteotomy) or to remove loose pieces of bone and cartilage. Joint replacement may be needed in advanced states of osteoarthritis. HOME CARE INSTRUCTIONS   Take medicines only as directed by your health care provider.  Maintain a healthy weight. Follow your health care provider's instructions for weight control. This may include dietary instructions.  Exercise as directed. Your health care provider can recommend specific types of exercise. These may include:  Strengthening exercises. These are done to strengthen the muscles that support joints affected by arthritis. They can be performed with weights or with exercise bands to add resistance.  Aerobic activities. These are exercises, such as brisk walking or low-impact aerobics, that get your heart pumping.  Range-of-motion activities. These keep your joints limber.  Balance and agility exercises. These help you maintain daily living skills.  Rest your affected joints as directed by your health care provider.  Keep all follow-up visits as directed by your health care provider. SEEK MEDICAL CARE IF:   Your skin turns red.  You develop a  rash in addition to your joint pain.  You have worsening joint pain.  You have a fever along with joint or muscle aches. SEEK IMMEDIATE MEDICAL CARE IF:  You have a significant loss of weight or appetite.  You have night sweats. [place] of Arthritis and Musculoskeletal and Skin Diseases: www.niams.SouthExposed.es  [employer] on Aging: http://kim-miller.com/  American College of Rheumatology: www.rheumatology.org Document Released: 03/15/2005 Document Revised: 07/30/2013 Document Reviewed: 11/20/2012 [facility] Patient Information 2015 [place], [place]. This information is not intended to replace advice given to you by your health care provider. Make sure you discuss any questions you have with your health care provider.

## 2014-03-06 ENCOUNTER — Telehealth: Payer: Self-pay

## 2014-03-06 NOTE — Telephone Encounter (Signed)
Dr. Everlene Farrier please advise if rx should be for a qty of 30 instead of 10.

## 2014-03-06 NOTE — Telephone Encounter (Signed)
Patient says he normally gets 30 tablets of Cialis and pays $40. However, he was only rx 10 tablets for the same price. He wants to know why only 10 tablets were rx

## 2014-03-07 NOTE — Telephone Encounter (Signed)
It would be fine to change him to 30

## 2014-03-07 NOTE — Telephone Encounter (Signed)
Called pharmacy and pt picked up #30 on 12/9

## 2014-03-10 ENCOUNTER — Telehealth: Payer: Self-pay

## 2014-03-10 NOTE — Telephone Encounter (Signed)
Left message on machine to call back  Received labs from [facility]. Pt's Total chol was 377 and his LDL was 304. Pt is on Crestor 40 mg qd. Make sure he is taking qd. If not, stress to him the importance of this. If so, let Dr. Everlene Farrier know. All of the rest of his labs are normal.  Labs at [facility]. Will send to be scanned once pt is contacted.

## 2014-03-11 NOTE — Telephone Encounter (Signed)
Pt advised of lab work. He has just started the Crestor 40mg  daily now. Pt advised to recheck labs in 2-3 months.

## 2014-04-08 ENCOUNTER — Other Ambulatory Visit: Payer: Self-pay | Admitting: Physician Assistant

## 2014-08-08 ENCOUNTER — Ambulatory Visit (INDEPENDENT_AMBULATORY_CARE_PROVIDER_SITE_OTHER): Payer: BLUE CROSS/BLUE SHIELD | Admitting: Emergency Medicine

## 2014-08-08 ENCOUNTER — Encounter: Payer: Self-pay | Admitting: Emergency Medicine

## 2014-08-08 VITALS — BP 120/70 | HR 67 | Temp 98.2°F | Resp 16 | Ht 74.5 in | Wt 264.4 lb

## 2014-08-08 DIAGNOSIS — E785 Hyperlipidemia, unspecified: Secondary | ICD-10-CM

## 2014-08-08 DIAGNOSIS — R635 Abnormal weight gain: Secondary | ICD-10-CM

## 2014-08-08 DIAGNOSIS — R5382 Chronic fatigue, unspecified: Secondary | ICD-10-CM

## 2014-08-08 DIAGNOSIS — R454 Irritability and anger: Secondary | ICD-10-CM

## 2014-08-08 DIAGNOSIS — R1907 Generalized intra-abdominal and pelvic swelling, mass and lump: Secondary | ICD-10-CM | POA: Diagnosis not present

## 2014-08-08 LAB — CBC WITH DIFFERENTIAL/PLATELET
Basophils Absolute: 0 10*3/uL (ref 0.0–0.1)
Basophils Relative: 0 % (ref 0–1)
Eosinophils Absolute: 0.1 10*3/uL (ref 0.0–0.7)
Eosinophils Relative: 1 % (ref 0–5)
HEMATOCRIT: 45.2 % (ref 39.0–52.0)
HEMOGLOBIN: 15.1 g/dL (ref 13.0–17.0)
LYMPHS PCT: 32 % (ref 12–46)
Lymphs Abs: 1.8 10*3/uL (ref 0.7–4.0)
MCH: 31.1 pg (ref 26.0–34.0)
MCHC: 33.4 g/dL (ref 30.0–36.0)
MCV: 93 fL (ref 78.0–100.0)
MONOS PCT: 9 % (ref 3–12)
MPV: 9.3 fL (ref 8.6–12.4)
Monocytes Absolute: 0.5 10*3/uL (ref 0.1–1.0)
NEUTROS ABS: 3.2 10*3/uL (ref 1.7–7.7)
Neutrophils Relative %: 58 % (ref 43–77)
Platelets: 198 10*3/uL (ref 150–400)
RBC: 4.86 MIL/uL (ref 4.22–5.81)
RDW: 13.2 % (ref 11.5–15.5)
WBC: 5.5 10*3/uL (ref 4.0–10.5)

## 2014-08-08 LAB — COMPLETE METABOLIC PANEL WITH GFR
ALK PHOS: 73 U/L (ref 39–117)
ALT: 20 U/L (ref 0–53)
AST: 17 U/L (ref 0–37)
Albumin: 4.3 g/dL (ref 3.5–5.2)
BILIRUBIN TOTAL: 0.9 mg/dL (ref 0.2–1.2)
BUN: 15 mg/dL (ref 6–23)
CALCIUM: 9.3 mg/dL (ref 8.4–10.5)
CO2: 27 mEq/L (ref 19–32)
CREATININE: 0.87 mg/dL (ref 0.50–1.35)
Chloride: 103 mEq/L (ref 96–112)
GFR, Est Non African American: >89 mL/min
GLUCOSE: 94 mg/dL (ref 70–99)
Potassium: 4.6 mEq/L (ref 3.5–5.3)
Sodium: 138 mEq/L (ref 135–145)
Total Protein: 7 g/dL (ref 6.0–8.3)

## 2014-08-08 LAB — TSH: TSH: 1.178 u[IU]/mL (ref 0.350–4.500)

## 2014-08-08 LAB — T4, FREE: Free T4: 0.8 ng/dL (ref 0.80–1.80)

## 2014-08-08 LAB — IFOBT (OCCULT BLOOD): IMMUNOLOGICAL FECAL OCCULT BLOOD TEST: NEGATIVE

## 2014-08-08 NOTE — Progress Notes (Addendum)
Subjective:  This chart was scribed for [name], MD by Ladene Artist, ED Scribe. The patient was seen in room 23. Patient's care was started at 11:49 AM.   Patient ID: Scott [name], male    DOB: 10/03/1960, 54 y.o.   MRN: [id number]  Chief Complaint  Patient presents with  . Weight Gain    per patient since Thanksgiving 01/2050  . Bloated    no energy   HPI HPI Comments: Scott [name] is a 54 y.o. male, with a h/o hyperlipidemia, Asperger's syndrome, who presents to the [facility] complaining of weight gain over the past 6-8 months. Pt suspects a 20 lb weight gain since December. Per previous charts, pt was 252 lb on 12/16/13 and 264 lb on 03/06/15. Pt states that he exercises almost every day and walks while golfing. Pt is still taking Zoloft.   Bloating Pt reports feeling bloated "all the time". He does not recall his last solid BM; describes his BMs as liquid or similar to "chocolate pudding". He reports associated fatigue and decreased sexual desire. He denies abdominal pain and changes to diet other than recently eating gluten free. Pt's last colonoscopy was 4 years ago. No family h/o abdominal complications.   Prostate  Pt reports that urologist Raynelle Bring did pt's robotic prostatectomy in 04/2009. Pt states that his testosterone level was on the low side of normal at that time.   Melanoma Pt had a melanoma removed from his R jaw since his last visit. No concerns at this time.   Pt reports that things are going well at work. They just started final exams today.   Past Medical History  Diagnosis Date  . Hyperlipidemia   . Premature ejaculation     07/2004  . Unspecified vitamin D deficiency   . Multiple lipomas   . Malignant neoplasm of prostate   . Pulmonary embolism     04/2009  . Asperger's syndrome     symptoms of  . Herpes simplex type II infection    Current Outpatient Prescriptions on File Prior to Visit  Medication Sig  Dispense Refill  . rosuvastatin (CRESTOR) 40 MG tablet Take 1 tablet (40 mg total) by mouth daily. Needs office visit before anymore refills 30 tablet 5  . sertraline (ZOLOFT) 100 MG tablet Take one half tablet daily for one week then one tablet daily 30 tablet 11  . tadalafil (CIALIS) 5 MG tablet Take 1 tablet (5 mg total) by mouth daily as needed. 10 tablet 11  . valACYclovir (VALTREX) 500 MG tablet Take 1 tablet (500 mg total) by mouth daily. 30 tablet 11   No current facility-administered medications on file prior to visit.   No Known Allergies  Review of Systems  Constitutional: Positive for fatigue.  Gastrointestinal: Positive for abdominal distention. Negative for abdominal pain.   BP 120/70 mmHg  Pulse 67  Temp(Src) 98.2 F (36.8 C) (Oral)  Resp 16  Ht 6' 2.5" (1.892 m)  Wt 264 lb 6.4 oz (119.931 kg)  BMI 33.50 kg/m2  SpO2 96%    Objective:   Physical Exam  Constitutional: He is oriented to person, place, and time. He appears well-developed and well-nourished. No distress.  HENT:  Head: Normocephalic and atraumatic.  Eyes: Conjunctivae and EOM are normal.  Neck: Neck supple. No tracheal deviation present.  Cardiovascular: Normal rate.   Pulmonary/Chest: Effort normal. No respiratory distress.  Genitourinary: Rectum normal.  Surgically absent prostate. No rectal masses.  Musculoskeletal: Normal range of motion.  Neurological: He is alert and oriented to person, place, and time.  Skin: Skin is warm and dry.  Psychiatric: He has a normal mood and affect. His behavior is normal.  Nursing note and vitals reviewed.  Results for orders placed or performed in visit on 08/08/14  IFOBT POC (occult bld, rslt in office)  Result Value Ref Range   IFOBT Negative       Assessment & Plan:  I suspect his sensation weight gain is related to his Zoloft. His weight was 252 in September but up to 264 in December. His weight is stable since that time. We'll go ahead and CT the  abdomen and pelvis to rule out acidic fluid. I suspect the weight gain is related to his Zoloft. It may have leveled off. This has helped his anger feelings and I don't want to discontinue it.I personally performed the services described in this documentation, which was scribed in my presence. The recorded information has been reviewed and is accurate.  Nena Jordan, MD

## 2014-08-09 LAB — TESTOSTERONE: Testosterone: 273 ng/dL — ABNORMAL LOW (ref 300–890)

## 2014-08-16 ENCOUNTER — Ambulatory Visit
Admission: RE | Admit: 2014-08-16 | Discharge: 2014-08-16 | Disposition: A | Discharge status: Home / Self Care | Payer: BLUE CROSS/BLUE SHIELD | Source: Ambulatory Visit | Attending: Emergency Medicine | Admitting: Emergency Medicine

## 2014-08-16 DIAGNOSIS — R1907 Generalized intra-abdominal and pelvic swelling, mass and lump: Secondary | ICD-10-CM

## 2014-08-16 DIAGNOSIS — R635 Abnormal weight gain: Secondary | ICD-10-CM

## 2014-08-16 MED ORDER — IOPAMIDOL (ISOVUE-300) INJECTION 61%
125.0000 mL | Freq: Once | INTRAVENOUS | Status: AC | PRN
  Administered 2014-08-16: 125 mL via INTRAVENOUS

## 2014-12-28 HISTORY — PX: KNEE ARTHROSCOPY W/ MENISCAL REPAIR: SHX1877

## 2014-12-30 ENCOUNTER — Other Ambulatory Visit: Payer: Self-pay | Admitting: Urgent Care

## 2014-12-31 ENCOUNTER — Other Ambulatory Visit: Payer: Self-pay | Admitting: Emergency Medicine

## 2014-12-31 ENCOUNTER — Encounter: Payer: Self-pay | Admitting: Emergency Medicine

## 2015-01-05 ENCOUNTER — Ambulatory Visit (INDEPENDENT_AMBULATORY_CARE_PROVIDER_SITE_OTHER): Payer: BLUE CROSS/BLUE SHIELD | Admitting: Emergency Medicine

## 2015-01-05 ENCOUNTER — Ambulatory Visit (INDEPENDENT_AMBULATORY_CARE_PROVIDER_SITE_OTHER): Payer: BLUE CROSS/BLUE SHIELD

## 2015-01-05 VITALS — BP 128/80 | HR 97 | Temp 98.6°F | Resp 16 | Ht 76.0 in | Wt 277.0 lb

## 2015-01-05 DIAGNOSIS — M25562 Pain in left knee: Secondary | ICD-10-CM

## 2015-01-05 MED ORDER — MELOXICAM 15 MG PO TABS: 15.0000 mg | ORAL_TABLET | Freq: Every day | ORAL | Status: DC

## 2015-01-05 MED ORDER — HYDROCODONE-ACETAMINOPHEN 5-325 MG PO TABS: 1.0000 | ORAL_TABLET | Freq: Four times a day (QID) | ORAL | Status: DC | PRN

## 2015-01-05 NOTE — Progress Notes (Signed)
This chart was scribed for Scott Queen, MD by Leandra Kern, Medical Scribe. This patient was seen in Room 8 and the patient's care was started at 11:46 AM.  Chief Complaint:  Chief Complaint  Patient presents with  . Knee Pain    left, x 9-10 days     HPI: JAHEEM [name] is a 54 y.o. male who reports to [facility] today complaining of left knee pain, onset 9 days ago.  Pt reports that he was playing golf, and suddenly he started experiencing a sudden onset of pain that gradually increased in severity. Pt states that the tendons in the area feel tight, and that pain alleviates when he performs physical activity such as standing and walking. Pt reports that he uses ice packs for the symptoms. Pt states that he had knee complications when he was a teenager, however he denies experiencing any other problems since then.   Pt's orthopedic physician is Dr. Mayer Camel.   Past Medical History  Diagnosis Date  . Hyperlipidemia   . Premature ejaculation     07/2004  . Unspecified vitamin D deficiency   . Multiple lipomas   . Malignant neoplasm of prostate ([place])   . Pulmonary embolism ([place])     04/2009  . Asperger's syndrome     symptoms of  . Herpes simplex type II infection    Past Surgical History  Procedure Laterality Date  . Hip repalcement      2012  . Robotic prostatectomy      05/21/2009  . Shoulder open rotator cuff repair    . Cataract extraction, bilateral     Social History   Social History  . Marital Status: Married    Spouse Name: N/A  . Number of Children: N/A  . Years of Education: N/A   Social History Main Topics  . Smoking status: Former Smoker -- 1.00 packs/day    Types: Cigarettes    Quit date: 03/29/2002  . Smokeless tobacco: Never Used  . Alcohol Use: 1.5 - 2.5 oz/week    3-5 drink(s) per week  . Drug Use: No  . Sexual Activity: Not Asked   Other Topics Concern  . None   Social History Narrative   Family History  Problem Relation Age of Onset   . Heart disease    . Hyperlipidemia    . Diabetes      ?  Marland Kitchen Diabetes Mother   . Hypertension Mother   . Rheum arthritis Mother   . Cancer Father   . Heart failure Father     "heart trouble"   No Known Allergies Prior to Admission medications   Medication Sig Start Date End Date Taking? Authorizing Provider  rosuvastatin (CRESTOR) 40 MG tablet TAKE 1 TABLET BY MOUTH DAILY (NO MORE REFILLS WITHOUT OFFICE VISIT) 12/31/14   Chelle Jeffery, PA-C  sertraline (ZOLOFT) 100 MG tablet TAKE 1/2 TABLET BY MOUTH EVERY DAY FOR 7 WEEK, THEN 1 TABLET BY MOUTH EVERY DAY.  "OFFICE VISIT NEEDED FOR REFILLS" 01/01/15   Chelle Jeffery, PA-C  tadalafil (CIALIS) 5 MG tablet Take 1 tablet (5 mg total) by mouth daily as needed. 03/05/14   Jaynee Eagles, PA-C  valACYclovir (VALTREX) 500 MG tablet Take 1 tablet (500 mg total) by mouth daily. 12/16/13   [name], MD     ROS: The patient has arthralgia.   All other systems have been reviewed and were otherwise negative with the exception of those mentioned in the HPI and  as above.    PHYSICAL EXAM: Filed Vitals:   01/05/15 1115  BP: 128/80  Pulse: 97  Temp: 98.6 F (37 C)  Resp: 16   Body mass index is 33.73 kg/(m^2).   General: Alert, no acute distress HEENT:  Normocephalic, atraumatic, oropharynx patent. Eye: Juliette Mangle [facility] Cardiovascular:  Regular rate and rhythm, no rubs murmurs or gallops.  No Carotid bruits, radial pulse intact. No pedal edema.  Respiratory: Clear to auscultation bilaterally.  No wheezes, rales, or rhonchi.  No cyanosis, no use of accessory musculature Abdominal: No organomegaly, abdomen is soft and non-tender, positive bowel sounds.  No masses. Musculoskeletal: Gait intact. No edema. Joint effusion in the left knee is present. McMurray test was positive for left knee joint. Ligaments are intact. There is a mild joint effusion.  Skin: No rashes. Neurologic: Facial musculature symmetric. Psychiatric: Patient acts appropriately  throughout our interaction. Lymphatic: No cervical or submandibular lymphadenopathy   LABS:    EKG/XRAY:   Primary read interpreted by Dr. Everlene Farrier at [facility]. Knee films show very mild degenerative changes   ASSESSMENT/PLAN: I placed patient on Mobic 15 one a day. MRI ordered. Referral to orthopedics if internal derangement noted on MRI.I personally performed the services described in this documentation, which was scribed in my presence. The recorded information has been reviewed and is accurate.  By signing my name below, I, Rawaa Al Rifaie, attest that this documentation has been prepared under the direction and in the presence of Scott Queen, MD.  Leandra Kern, Medical Scribe. 01/05/2015.  11:53 AM.     [name] sideeffects, risk and benefits, and alternatives of medications d/w patient. Patient is aware that all medications have potential sideeffects and we are unable to predict every sideeffect or drug-drug interaction that may occur.  Scott Queen MD 01/05/2015 11:46 AM

## 2015-01-05 NOTE — Patient Instructions (Signed)

## 2015-01-17 ENCOUNTER — Ambulatory Visit
Admission: RE | Admit: 2015-01-17 | Discharge: 2015-01-17 | Disposition: A | Discharge status: Home / Self Care | Payer: BLUE CROSS/BLUE SHIELD | Source: Ambulatory Visit | Attending: Emergency Medicine | Admitting: Emergency Medicine

## 2015-01-17 DIAGNOSIS — M25562 Pain in left knee: Secondary | ICD-10-CM

## 2015-01-18 ENCOUNTER — Other Ambulatory Visit: Payer: Self-pay | Admitting: Physician Assistant

## 2015-01-18 DIAGNOSIS — S83207A Unspecified tear of unspecified meniscus, current injury, left knee, initial encounter: Secondary | ICD-10-CM

## 2015-01-20 ENCOUNTER — Other Ambulatory Visit: Payer: BLUE CROSS/BLUE SHIELD

## 2015-01-20 ENCOUNTER — Telehealth: Payer: Self-pay

## 2015-01-20 NOTE — Telephone Encounter (Signed)
Pt would like to have ortho referral sent to Dr. Frederik Pear at [facility]. Dr. Mayer Camel has done a previous hip surgery on pt.

## 2015-01-23 ENCOUNTER — Other Ambulatory Visit: Payer: Self-pay | Admitting: Physician Assistant

## 2015-01-24 ENCOUNTER — Other Ambulatory Visit: Payer: BLUE CROSS/BLUE SHIELD

## 2015-01-28 ENCOUNTER — Other Ambulatory Visit: Payer: Self-pay | Admitting: Emergency Medicine

## 2015-01-29 ENCOUNTER — Ambulatory Visit (INDEPENDENT_AMBULATORY_CARE_PROVIDER_SITE_OTHER): Payer: BLUE CROSS/BLUE SHIELD | Admitting: Family Medicine

## 2015-01-29 VITALS — BP 118/82 | HR 87 | Temp 98.6°F | Resp 18 | Ht 76.0 in | Wt 279.0 lb

## 2015-01-29 DIAGNOSIS — E785 Hyperlipidemia, unspecified: Secondary | ICD-10-CM | POA: Diagnosis not present

## 2015-01-29 DIAGNOSIS — F329 Major depressive disorder, single episode, unspecified: Secondary | ICD-10-CM | POA: Diagnosis not present

## 2015-01-29 DIAGNOSIS — F32A Depression, unspecified: Secondary | ICD-10-CM

## 2015-01-29 MED ORDER — SERTRALINE HCL 100 MG PO TABS: ORAL_TABLET | ORAL | Status: DC

## 2015-01-29 MED ORDER — ROSUVASTATIN CALCIUM 40 MG PO TABS: ORAL_TABLET | ORAL | Status: DC

## 2015-01-29 NOTE — Progress Notes (Signed)
This chart was scribed for Scott Haber, MD by Moises Blood, medical scribe at [facility].The patient was seen in exam room 10 and the patient's care was started at 2:08 PM.  Patient ID: RIOT WATERWORTH MRN: [id number], DOB: April 14, 1960, 54 y.o. Date of Encounter: 01/29/2015  Primary Physician: Jenny Reichmann, MD  Chief Complaint:  Chief Complaint  Patient presents with  . Medication Refill    crestor, zoloft    HPI:  Scott [name] is a 54 y.o. male who presents to [facility] for medication refill.  He's out of his crestor and zoloft and went to the drug store. They called into the office but says it was declined and needed doctor approval.   Previously, he was seen by Dr. Everlene Farrier in Oct for acute knee pain. He was referred by [place], but he contacted his own orthopedic (Dr. Mayer Camel) and had his knee done.   He works in Geologist, engineering at [employer].   Past Medical History  Diagnosis Date  . Hyperlipidemia   . Premature ejaculation     07/2004  . Unspecified vitamin D deficiency   . Multiple lipomas   . Malignant neoplasm of prostate ([place])   . Pulmonary embolism ([place])     04/2009  . Asperger's syndrome     symptoms of  . Herpes simplex type II infection      Home Meds: Prior to Admission medications   Medication Sig Start Date End Date Taking? Authorizing Provider  rosuvastatin (CRESTOR) 40 MG tablet TAKE 1 TABLET BY MOUTH DAILY (NO MORE REFILLS WITHOUT OFFICE VISIT) 12/31/14  Yes Chelle Jeffery, PA-C  sertraline (ZOLOFT) 100 MG tablet TAKE 1/2 TABLET BY MOUTH EVERY DAY FOR 7 WEEK, THEN 1 TABLET BY MOUTH EVERY DAY.  "OFFICE VISIT NEEDED FOR REFILLS" Patient taking differently: 100 mg. Takes one tablet daily 01/01/15  Yes Chelle Jeffery, PA-C  tadalafil (CIALIS) 5 MG tablet Take 1 tablet (5 mg total) by mouth daily as needed. 03/05/14  Yes Jaynee Eagles, PA-C  valACYclovir (VALTREX) 500 MG tablet Take 1 tablet (500 mg total) by mouth  daily. 12/16/13  Yes [name], MD  HYDROcodone-acetaminophen (NORCO) 5-325 MG tablet Take 1 tablet by mouth every 6 (six) hours as needed for moderate pain. Patient not taking: Reported on 01/29/2015 01/05/15   [name], MD  meloxicam (MOBIC) 15 MG tablet Take 1 tablet (15 mg total) by mouth daily. Patient not taking: Reported on 01/29/2015 01/05/15   [name], MD    Allergies: No Known Allergies  Social History   Social History  . Marital Status: Married    Spouse Name: N/A  . Number of Children: N/A  . Years of Education: N/A   Occupational History  . Not on file.   Social History Main Topics  . Smoking status: Former Smoker -- 1.00 packs/day    Types: Cigarettes    Quit date: 03/29/2002  . Smokeless tobacco: Never Used  . Alcohol Use: 1.5 - 2.5 oz/week    3-5 drink(s) per week  . Drug Use: No  . Sexual Activity: Not on file   Other Topics Concern  . Not on file   Social History Narrative     Review of Systems: Constitutional: negative for fever, chills, night sweats, weight changes, or fatigue  HEENT: negative for vision changes, hearing loss, congestion, rhinorrhea, ST, epistaxis, or sinus pressure Cardiovascular: negative for chest pain or palpitations Respiratory: negative for  hemoptysis, wheezing, shortness of breath, or cough Abdominal: negative for abdominal pain, nausea, vomiting, diarrhea, or constipation Dermatological: negative for rash Neurologic: negative for headache, dizziness, or syncope All other systems reviewed and are otherwise negative with the exception to those above and in the HPI.  Physical Exam: Blood pressure 118/82, pulse 87, temperature 98.6 F (37 C), temperature source Oral, resp. rate 18, height 6\' 4"  (1.93 m), weight 279 lb (126.554 kg), SpO2 96 %., Body mass index is 33.98 kg/(m^2). General: Well developed, well nourished, in no acute distress. Head: Normocephalic, atraumatic, eyes without discharge, sclera non-icteric,  nares are without discharge. Bilateral auditory canals clear, TM's are without perforation, pearly grey and translucent with reflective cone of light bilaterally. Oral cavity moist, posterior pharynx without exudate, erythema, peritonsillar abscess, or post nasal drip.  Neck: Supple. No thyromegaly. Full ROM. No lymphadenopathy. Lungs: Clear bilaterally to auscultation without wheezes, rales, or rhonchi. Breathing is unlabored. Heart: RRR with S1 S2. No murmurs, rubs, or gallops appreciated. Msk:  Strength and tone normal for age. Extremities/Skin: Warm and dry. No clubbing or cyanosis. No edema. No rashes or suspicious lesions. Neuro: Alert and oriented X 3. Moves all extremities spontaneously. Gait is normal. CNII-XII grossly in tact. Psych:  Responds to questions appropriately with a normal affect.    ASSESSMENT AND PLAN:  54 y.o. year old male with need for refills This chart was scribed in my presence and reviewed by me personally.    ICD-9-CM ICD-10-CM   1. Hyperlipidemia 272.4 E78.5 Lipid panel     COMPLETE METABOLIC PANEL WITH GFR     rosuvastatin (CRESTOR) 40 MG tablet  2. Depression 311 F32.9 sertraline (ZOLOFT) 100 MG tablet     By signing my name below, I, Moises Blood, attest that this documentation has been prepared under the direction and in the presence of Scott Haber, MD. Electronically Signed: Moises Blood, [place]. 01/29/2015 , 2:07 PM .  Signed, Scott Haber, MD 01/29/2015 2:07 PM

## 2015-01-30 LAB — COMPLETE METABOLIC PANEL WITH GFR
ALT: 26 U/L (ref 9–46)
AST: 22 U/L (ref 10–35)
Albumin: 4.3 g/dL (ref 3.6–5.1)
Alkaline Phosphatase: 80 U/L (ref 40–115)
BUN: 15 mg/dL (ref 7–25)
CO2: 25 mmol/L (ref 20–31)
Calcium: 9.6 mg/dL (ref 8.6–10.3)
Chloride: 101 mmol/L (ref 98–110)
Creat: 0.84 mg/dL (ref 0.70–1.33)
GFR, Est African American: >89 mL/min (ref >=60)
GFR, Est Non African American: >89 mL/min (ref >=60)
Glucose, Bld: 85 mg/dL (ref 65–99)
Potassium: 4.5 mmol/L (ref 3.5–5.3)
Sodium: 136 mmol/L (ref 135–146)
Total Bilirubin: 0.5 mg/dL (ref 0.2–1.2)
Total Protein: 7.2 g/dL (ref 6.1–8.1)

## 2015-01-30 LAB — LIPID PANEL
Cholesterol: 305 mg/dL — ABNORMAL HIGH (ref 125–200)
HDL: 39 mg/dL — ABNORMAL LOW (ref >=40)
LDL Cholesterol: 222 mg/dL — ABNORMAL HIGH (ref <130)
Total CHOL/HDL Ratio: 7.8 Ratio — ABNORMAL HIGH (ref <=5.0)
Triglycerides: 220 mg/dL — ABNORMAL HIGH (ref <150)
VLDL: 44 mg/dL — ABNORMAL HIGH (ref <30)

## 2015-07-10 ENCOUNTER — Encounter: Payer: Self-pay | Admitting: Emergency Medicine

## 2015-07-10 ENCOUNTER — Ambulatory Visit (INDEPENDENT_AMBULATORY_CARE_PROVIDER_SITE_OTHER): Payer: BLUE CROSS/BLUE SHIELD | Admitting: Emergency Medicine

## 2015-07-10 VITALS — BP 137/80 | HR 69 | Temp 98.8°F | Resp 18 | Ht 75.0 in | Wt 282.0 lb

## 2015-07-10 DIAGNOSIS — R635 Abnormal weight gain: Secondary | ICD-10-CM

## 2015-07-10 DIAGNOSIS — F32A Depression, unspecified: Secondary | ICD-10-CM

## 2015-07-10 DIAGNOSIS — Z1159 Encounter for screening for other viral diseases: Secondary | ICD-10-CM

## 2015-07-10 DIAGNOSIS — E669 Obesity, unspecified: Secondary | ICD-10-CM | POA: Diagnosis not present

## 2015-07-10 DIAGNOSIS — Z0000 Encounter for general adult medical examination without abnormal findings: Secondary | ICD-10-CM | POA: Diagnosis not present

## 2015-07-10 DIAGNOSIS — Z114 Encounter for screening for human immunodeficiency virus [HIV]: Secondary | ICD-10-CM | POA: Diagnosis not present

## 2015-07-10 DIAGNOSIS — E785 Hyperlipidemia, unspecified: Secondary | ICD-10-CM | POA: Diagnosis not present

## 2015-07-10 DIAGNOSIS — F329 Major depressive disorder, single episode, unspecified: Secondary | ICD-10-CM | POA: Diagnosis not present

## 2015-07-10 LAB — LIPID PANEL
Cholesterol: 203 mg/dL — ABNORMAL HIGH (ref 125–200)
HDL: 39 mg/dL — ABNORMAL LOW (ref >=40)
LDL CALC: 133 mg/dL — AB (ref <130)
TRIGLYCERIDES: 156 mg/dL — AB (ref <150)
Total CHOL/HDL Ratio: 5.2 Ratio — ABNORMAL HIGH (ref <=5.0)
VLDL: 31 mg/dL — AB (ref <30)

## 2015-07-10 LAB — CBC WITH DIFFERENTIAL/PLATELET
BASOS PCT: 0 %
Basophils Absolute: 0 cells/uL (ref 0–200)
EOS ABS: 98 {cells}/uL (ref 15–500)
Eosinophils Relative: 2 %
HEMATOCRIT: 41 % (ref 38.5–50.0)
Hemoglobin: 13.8 g/dL (ref 13.2–17.1)
Lymphocytes Relative: 24 %
Lymphs Abs: 1176 cells/uL (ref 850–3900)
MCH: 31 pg (ref 27.0–33.0)
MCHC: 33.7 g/dL (ref 32.0–36.0)
MCV: 92.1 fL (ref 80.0–100.0)
MONO ABS: 441 {cells}/uL (ref 200–950)
MONOS PCT: 9 %
MPV: 8.9 fL (ref 7.5–12.5)
NEUTROS ABS: 3185 {cells}/uL (ref 1500–7800)
Neutrophils Relative %: 65 %
PLATELETS: 174 10*3/uL (ref 140–400)
RBC: 4.45 MIL/uL (ref 4.20–5.80)
RDW: 13.3 % (ref 11.0–15.0)
WBC: 4.9 10*3/uL (ref 3.8–10.8)

## 2015-07-10 LAB — HEPATITIS C ANTIBODY: HCV AB: NEGATIVE

## 2015-07-10 LAB — COMPLETE METABOLIC PANEL WITH GFR
ALK PHOS: 73 U/L (ref 40–115)
ALT: 17 U/L (ref 9–46)
AST: 19 U/L (ref 10–35)
Albumin: 4.4 g/dL (ref 3.6–5.1)
BILIRUBIN TOTAL: 0.6 mg/dL (ref 0.2–1.2)
BUN: 14 mg/dL (ref 7–25)
CO2: 23 mmol/L (ref 20–31)
Calcium: 8.9 mg/dL (ref 8.6–10.3)
Chloride: 105 mmol/L (ref 98–110)
Creat: 0.86 mg/dL (ref 0.70–1.33)
GLUCOSE: 94 mg/dL (ref 65–99)
POTASSIUM: 4.6 mmol/L (ref 3.5–5.3)
SODIUM: 140 mmol/L (ref 135–146)
Total Protein: 7.2 g/dL (ref 6.1–8.1)

## 2015-07-10 LAB — HIV ANTIBODY (ROUTINE TESTING W REFLEX): HIV 1&2 Ab, 4th Generation: NONREACTIVE

## 2015-07-10 LAB — POCT URINALYSIS DIP (MANUAL ENTRY)
BILIRUBIN UA: NEGATIVE
Bilirubin, UA: NEGATIVE
Glucose, UA: NEGATIVE
Leukocytes, UA: NEGATIVE
Nitrite, UA: NEGATIVE
PH UA: 7
PROTEIN UA: NEGATIVE
SPEC GRAV UA: 1.025
Urobilinogen, UA: 2

## 2015-07-10 LAB — POC MICROSCOPIC URINALYSIS (UMFC): Mucus: ABSENT

## 2015-07-10 NOTE — Progress Notes (Addendum)
Patient ID: Scott [name], male   DOB: 1960-04-21, 55 y.o.   MRN: [id number]    By signing my name below, I, Scott [name], attest that this documentation has been prepared under the direction and in the presence of Scott Russian, MD Electronically Signed: Ladene [name], ED Scribe 07/10/2015 at 2:42 PM.  Chief Complaint:  Chief Complaint  Patient presents with  . Annual Exam   HPI: Scott DUPLER is a 55 y.o. male, with a h/o hyperlipidemia, who reports to [facility] today for an annual exam. Pt states that he is complaint with all of his medications.   Weight Pt states that he exercises at the gym at [facility] on the elliptical and lifts weights. He aslo plays golf. He denies chest pain on exertion. Pt states that he is not eating more than usual but has still noticed significant weight gain. Pt's wife has noticed snoring but denies apnea.   Wt Readings from Last 3 Encounters:  07/10/15 282 lb (127.914 kg)  01/29/15 279 lb (126.554 kg)  01/05/15 277 lb (125.646 kg)   Wife also wants him to be tested for Celiac disease. Pt's father has a h/o Crohn's disease and colon CA diagnosed in his early 65s. Pt's colonoscopy is UTD. His pshx includes a prostatectomy. PSA is followed by his urologist annually.   Eyes Pt had cataracts surgery summers ago.   L Knee Pt states that he had a scope done on his left knee torn meniscus several months ago but still has intermittent left knee pain. He reports increased stiffness with prolonged use. Pt has tried Mobic x1 nightly with significant relief.   Valtrex Pt requests a medication refill of Valtrex at this visit. He takes 1 tablet daily when he has flare-ups which occur x4-5/year.   Pt's daughter graduated from [employer] and was recently accepted into [employer]'s surgical tech's program.   Past Medical History  Diagnosis Date  . Hyperlipidemia   . Premature ejaculation     07/2004  . Unspecified vitamin D deficiency   . Multiple lipomas     . Malignant neoplasm of prostate ([place])   . Pulmonary embolism ([place])     04/2009  . Asperger's syndrome     symptoms of  . Herpes simplex type II infection    Past Surgical History  Procedure Laterality Date  . Hip repalcement      2012  . Robotic prostatectomy      05/21/2009  . Shoulder open rotator cuff repair    . Cataract extraction, bilateral     Social History   Social History  . Marital Status: Married    Spouse Name: N/A  . Number of Children: N/A  . Years of Education: N/A   Social History Main Topics  . Smoking status: Former Smoker -- 1.00 packs/day    Types: Cigarettes    Quit date: 03/29/2002  . Smokeless tobacco: Never Used  . Alcohol Use: 1.5 - 2.5 oz/week    3-5 drink(s) per week  . Drug Use: No  . Sexual Activity: Not Asked   Other Topics Concern  . None   Social History Narrative   Family History  Problem Relation Age of Onset  . Heart disease    . Hyperlipidemia    . Diabetes      ?  Marland Kitchen Diabetes Mother   . Hypertension Mother   . Rheum arthritis Mother   . Cancer Father   . Heart failure Father     "  heart trouble"   No Known Allergies Prior to Admission medications   Medication Sig Start Date End Date Taking? Authorizing Provider  HYDROcodone-acetaminophen (NORCO) 5-325 MG tablet Take 1 tablet by mouth every 6 (six) hours as needed for moderate pain. Patient not taking: Reported on 01/29/2015 01/05/15   Scott Russian, MD  meloxicam (MOBIC) 15 MG tablet Take 1 tablet (15 mg total) by mouth daily. Patient not taking: Reported on 01/29/2015 01/05/15   Scott Russian, MD  rosuvastatin (CRESTOR) 40 MG tablet TAKE 1 TABLET BY MOUTH DAILY 01/29/15   Scott Haber, MD  sertraline (ZOLOFT) 100 MG tablet 1 daily 01/29/15   Scott Haber, MD  tadalafil (CIALIS) 5 MG tablet Take 1 tablet (5 mg total) by mouth daily as needed. 03/05/14   Scott Eagles, PA-C  valACYclovir (VALTREX) 500 MG tablet Take 1 tablet (500 mg total) by mouth daily. 12/16/13   Scott Russian, MD   ROS: The patient denies fevers, chills, night sweats, unintentional weight loss, chest pain, palpitations, wheezing, dyspnea on exertion, nausea, vomiting, abdominal pain, dysuria, hematuria, melena, numbness, weakness, or tingling. +arthralgias    All other systems have been reviewed and were otherwise negative with the exception of those mentioned in the HPI and as above.    PHYSICAL EXAM: Filed Vitals:   07/10/15 1400  BP: 137/80  Pulse: 69  Temp: 98.8 F (37.1 C)  Resp: 18   Body mass index is 35.25 kg/(m^2).  General: Alert, no acute distress HEENT:  Normocephalic, atraumatic, oropharynx patent. Eye: Scott [name] [facility] Cardiovascular: Regular rate and rhythm, no rubs murmurs or gallops. No Carotid bruits, radial pulse intact. No pedal edema.  Respiratory: Clear to auscultation bilaterally. No wheezes, rales, or rhonchi. No cyanosis, no use of accessory musculature Abdominal: No organomegaly, abdomen is soft and non-tender, positive bowel sounds. No masses. Musculoskeletal: Gait intact. No edema, tenderness Skin: No rashes. Neurologic: Facial musculature symmetric. Psychiatric: Patient acts appropriately throughout our interaction. Lymphatic: No cervical or submandibular lymphadenopathy  LABS:  EKG/XRAY:   Primary read interpreted by Dr. [facility]lene Farrier at [facility].Normal sinus rhythm  ASSESSMENT/PLAN: Patient has had his prostate removed prostate cancer and this is followed with the urologist. Weight continues to be an issue. He continues to have trouble with his left knee and is going back to see the orthopedist about this. He has had a left hip replacement. We'll taper off of Zoloft and see if that helps with his weight. Currently on 100 mg a day will taper over the next 3 weeks. No other changes in medications routine labs were done. I did a celiac panel at the request of wife.I personally performed the services described in this documentation, which was scribed in my presence.  The recorded information has been reviewed and is accurate.    Gross sideeffects, risk and benefits, and alternatives of medications d/w patient. Patient is aware that all medications have potential sideeffects and we are unable to predict every sideeffect or drug-drug interaction that may occur.  Arlyss Queen MD 07/10/2015 2:04 PM

## 2015-07-10 NOTE — Patient Instructions (Addendum)
IF you received an x-ray today, you will receive an invoice from [facility]. Please contact [facility] at [phone] with questions or concerns regarding your invoice.   IF you received labwork today, you will receive an invoice from [employer]. Please contact [facility] at [phone] with questions or concerns regarding your invoice.   Our billing staff will not be able to assist you with questions regarding bills from these companies.  You will be contacted with the lab results as soon as they are available. The fastest way to get your results is to activate your My Chart account. Instructions are located on the last page of this paperwork. If you have not heard from [place] regarding the results in 2 weeks, please contact this office.    Zoloft tapering schedule. Week #1 alternate 1 tablet with one half tablet. Week #2 take one half tablet daily. Week #3 take one half tablet every other day.Health Maintenance, Male A healthy lifestyle and preventative care can promote health and wellness.  Maintain regular health, dental, and eye exams.  Eat a healthy diet. Foods like vegetables, fruits, whole grains, low-fat dairy products, and lean protein foods contain the nutrients you need and are low in calories. Decrease your intake of foods high in solid fats, added sugars, and salt. Get information about a proper diet from your health care provider, if necessary.  Regular physical exercise is one of the most important things you can do for your health. Most adults should get at least 150 minutes of moderate-intensity exercise (any activity that increases your heart rate and causes you to sweat) each week. In addition, most adults need muscle-strengthening exercises on 2 or more days a week.   Maintain a healthy weight. The body mass index (BMI) is a screening tool to identify possible weight problems. It provides an estimate of body fat based on  height and weight. Your health care provider can find your BMI and can help you achieve or maintain a healthy weight. For males 20 years and older:  A BMI below 18.5 is considered underweight.  A BMI of 18.5 to 24.9 is normal.  A BMI of 25 to 29.9 is considered overweight.  A BMI of 30 and above is considered obese.  Maintain normal blood lipids and cholesterol by exercising and minimizing your intake of saturated fat. Eat a balanced diet with plenty of fruits and vegetables. Blood tests for lipids and cholesterol should begin at age 108 and be repeated every 5 years. If your lipid or cholesterol levels are high, you are over age 46, or you are at high risk for heart disease, you may need your cholesterol levels checked more frequently.Ongoing high lipid and cholesterol levels should be treated with medicines if diet and exercise are not working.  If you smoke, find out from your health care provider how to quit. If you do not use tobacco, do not start.  Lung cancer screening is recommended for adults aged 3-80 years who are at high risk for developing lung cancer because of a history of smoking. A yearly low-dose CT scan of the lungs is recommended for people who have at least a 30-pack-year history of smoking and are current smokers or have quit within the past 15 years. A pack year of smoking is smoking an average of 1 pack of cigarettes a day for 1 year (for example, a 30-pack-year history of smoking could mean smoking 1 pack a day for 30 years or  2 packs a day for 15 years). Yearly screening should continue until the smoker has stopped smoking for at least 15 years. Yearly screening should be stopped for people who develop a health problem that would prevent them from having lung cancer treatment.  If you choose to drink alcohol, do not have more than 2 drinks per day. One drink is considered to be 12 oz (360 mL) of beer, 5 oz (150 mL) of wine, or 1.5 oz (45 mL) of liquor.  Avoid the use of  street drugs. Do not share needles with anyone. Ask for help if you need support or instructions about stopping the use of drugs.  High blood pressure causes heart disease and increases the risk of stroke. High blood pressure is more likely to develop in:  People who have blood pressure in the end of the normal range (100-139/85-89 mm Hg).  People who are overweight or obese.  People who are African American.  If you are 63-38 years of age, have your blood pressure checked every 3-5 years. If you are 67 years of age or older, have your blood pressure checked every year. You should have your blood pressure measured twice--once when you are at a hospital or clinic, and once when you are not at a hospital or clinic. Record the average of the two measurements. To check your blood pressure when you are not at a hospital or clinic, you can use:  An automated blood pressure machine at a pharmacy.  A home blood pressure monitor.  If you are 53-52 years old, ask your health care provider if you should take aspirin to prevent heart disease.  Diabetes screening involves taking a blood sample to check your fasting blood sugar level. This should be done once every 3 years after age 23 if you are at a normal weight and without risk factors for diabetes. Testing should be considered at a younger age or be carried out more frequently if you are overweight and have at least 1 risk factor for diabetes.  Colorectal cancer can be detected and often prevented. Most routine colorectal cancer screening begins at the age of 58 and continues through age 73. However, your health care provider may recommend screening at an earlier age if you have risk factors for colon cancer. On a yearly basis, your health care provider may provide home test kits to check for hidden blood in the stool. A small camera at the end of a tube may be used to directly examine the colon (sigmoidoscopy or colonoscopy) to detect the earliest forms  of colorectal cancer. Talk to your health care provider about this at age 27 when routine screening begins. A direct exam of the colon should be repeated every 5-10 years through age 45, unless early forms of precancerous polyps or small growths are found.  People who are at an increased risk for hepatitis B should be screened for this virus. You are considered at high risk for hepatitis B if:  You were born in a country where hepatitis B occurs often. Talk with your health care provider about which countries are considered high risk.  Your parents were born in a high-risk country and you have not received a shot to protect against hepatitis B (hepatitis B vaccine).  You have HIV or AIDS.  You use needles to inject street drugs.  You live with, or have sex with, someone who has hepatitis B.  You are a man who has sex with other men (MSM).  You get hemodialysis treatment.  You take certain medicines for conditions like cancer, organ transplantation, and autoimmune conditions.  Hepatitis C blood testing is recommended for all people born from [birth year] through 1966 and any individual with known risk factors for hepatitis C.  Healthy men should no longer receive prostate-specific antigen (PSA) blood tests as part of routine cancer screening. Talk to your health care provider about prostate cancer screening.  Testicular cancer screening is not recommended for adolescents or adult males who have no symptoms. Screening includes self-exam, a health care provider exam, and other screening tests. Consult with your health care provider about any symptoms you have or any concerns you have about testicular cancer.  Practice safe sex. Use condoms and avoid high-risk sexual practices to reduce the spread of sexually transmitted infections (STIs).  You should be screened for STIs, including gonorrhea and chlamydia if:  You are sexually active and are younger than 24 years.  You are older than 24 years,  and your health care provider tells you that you are at risk for this type of infection.  Your sexual activity has changed since you were last screened, and you are at an increased risk for chlamydia or gonorrhea. Ask your health care provider if you are at risk.  If you are at risk of being infected with HIV, it is recommended that you take a prescription medicine daily to prevent HIV infection. This is called pre-exposure prophylaxis (PrEP). You are considered at risk if:  You are a man who has sex with other men (MSM).  You are a heterosexual man who is sexually active with multiple partners.  You take drugs by injection.  You are sexually active with a partner who has HIV.  Talk with your health care provider about whether you are at high risk of being infected with HIV. If you choose to begin PrEP, you should first be tested for HIV. You should then be tested every 3 months for as long as you are taking PrEP.  Use sunscreen. Apply sunscreen liberally and repeatedly throughout the day. You should seek shade when your shadow is shorter than you. Protect yourself by wearing long sleeves, pants, a wide-brimmed hat, and sunglasses year round whenever you are outdoors.  Tell your health care provider of new moles or changes in moles, especially if there is a change in shape or color. Also, tell your health care provider if a mole is larger than the size of a pencil eraser.  A one-time screening for abdominal aortic aneurysm (AAA) and surgical repair of large AAAs by ultrasound is recommended for men aged 49-75 years who are current or former smokers.  Stay current with your vaccines (immunizations).   This information is not intended to replace advice given to you by your health care provider. Make sure you discuss any questions you have with your health care provider.   Document Released: 09/11/2007 Document Revised: 04/05/2014 Document Reviewed: 08/10/2010 Elsevier Interactive Patient  Education [employer].

## 2015-07-11 LAB — GLIA (IGA/G) + TTG IGA
GLIADIN IGA: 3 U (ref <20)
Gliadin IgG: 4 Units (ref <20)
TISSUE TRANSGLUTAMINASE AB, IGA: 1 U/mL (ref <4)

## 2015-07-16 LAB — TESTOS,TOTAL,FREE AND SHBG (FEMALE)
Sex Hormone Binding Glob.: 27 nmol/L (ref 10–50)
TESTOSTERONE,FREE: 35.5 pg/mL (ref 35.0–155.0)
TESTOSTERONE,TOTAL,LC/MS/MS: 275 ng/dL (ref 250–1100)

## 2015-07-18 ENCOUNTER — Encounter: Payer: Self-pay | Admitting: Emergency Medicine

## 2015-07-21 DIAGNOSIS — D1801 Hemangioma of skin and subcutaneous tissue: Secondary | ICD-10-CM | POA: Diagnosis not present

## 2015-07-21 DIAGNOSIS — D235 Other benign neoplasm of skin of trunk: Secondary | ICD-10-CM | POA: Diagnosis not present

## 2015-07-21 DIAGNOSIS — Z85820 Personal history of malignant melanoma of skin: Secondary | ICD-10-CM | POA: Diagnosis not present

## 2015-07-21 DIAGNOSIS — L821 Other seborrheic keratosis: Secondary | ICD-10-CM | POA: Diagnosis not present

## 2015-07-25 ENCOUNTER — Telehealth: Payer: Self-pay

## 2015-07-25 NOTE — Telephone Encounter (Signed)
IC pt. Gave message per DrMarland Kitchen Everlene Farrier.  Testosterone levels returned on 4/19 and WNL. Did advise pt of levels WNL. Message to Dr. Everlene Farrier to review and make sure he agrees with WNL for testosterone

## 2015-09-07 ENCOUNTER — Encounter (HOSPITAL_COMMUNITY): Payer: Self-pay | Admitting: Emergency Medicine

## 2015-09-07 ENCOUNTER — Emergency Department (HOSPITAL_COMMUNITY)
Admission: EM | Admit: 2015-09-07 | Discharge: 2015-09-08 | Disposition: A | Discharge status: Home / Self Care | Payer: BLUE CROSS/BLUE SHIELD | Attending: Emergency Medicine | Admitting: Emergency Medicine

## 2015-09-07 DIAGNOSIS — Z792 Long term (current) use of antibiotics: Secondary | ICD-10-CM | POA: Diagnosis not present

## 2015-09-07 DIAGNOSIS — T43591A Poisoning by other antipsychotics and neuroleptics, accidental (unintentional), initial encounter: Secondary | ICD-10-CM | POA: Insufficient documentation

## 2015-09-07 DIAGNOSIS — Z8546 Personal history of malignant neoplasm of prostate: Secondary | ICD-10-CM | POA: Diagnosis not present

## 2015-09-07 DIAGNOSIS — Z79899 Other long term (current) drug therapy: Secondary | ICD-10-CM | POA: Diagnosis not present

## 2015-09-07 DIAGNOSIS — Z87891 Personal history of nicotine dependence: Secondary | ICD-10-CM | POA: Insufficient documentation

## 2015-09-07 DIAGNOSIS — Z86711 Personal history of pulmonary embolism: Secondary | ICD-10-CM | POA: Insufficient documentation

## 2015-09-07 DIAGNOSIS — T50901A Poisoning by unspecified drugs, medicaments and biological substances, accidental (unintentional), initial encounter: Secondary | ICD-10-CM

## 2015-09-07 DIAGNOSIS — T43501A Poisoning by unspecified antipsychotics and neuroleptics, accidental (unintentional), initial encounter: Secondary | ICD-10-CM | POA: Diagnosis not present

## 2015-09-07 DIAGNOSIS — E785 Hyperlipidemia, unspecified: Secondary | ICD-10-CM | POA: Insufficient documentation

## 2015-09-07 NOTE — ED Notes (Signed)
Brought in by EMS from home with c/o drug overdose.  Per EMS, pt's wife called EMS and reported that pt overdosed his Seroquel tonight.  Pt was prescribed Seroquel 100 mg but pt took 300 mg tonight.  Pt denies SI--- stated, "I just wanted to be more relaxed and get more sleep".  Pt arrived to ED A/Ox4, no apparent distress noted.

## 2015-09-07 NOTE — ED Notes (Signed)
Poison Control contacted. Representative states that pt may be more drowsy than normal, but no life-threatening effects are expected. MD Audie Pinto notified of this information

## 2015-09-07 NOTE — ED Notes (Signed)
Bed: [id number] Expected date:  Expected time:  Means of arrival:  Comments: EMS overdose of Seroquel

## 2015-09-08 NOTE — ED Provider Notes (Signed)
CSN: [id number]     Arrival date & time 09/07/15  2158 History   First MD Initiated Contact with Patient 09/07/15 2223     Chief Complaint  Patient presents with  . Drug Overdose      Patient is a 55 y.o. male presenting with Overdose.  Drug Overdose  Brought in by EMS from home with c/o drug overdose. Per EMS, pt's wife called EMS and reported that pt overdosed his Seroquel tonight. Pt was prescribed Seroquel 100 mg but pt took 300 mg tonight. Pt denies SI--- stated, "I just wanted to be more relaxed and get more sleep". Pt arrived to ED A/Ox4, no apparent distress noted.  Past Medical History  Diagnosis Date  . Hyperlipidemia   . Premature ejaculation     07/2004  . Unspecified vitamin D deficiency   . Multiple lipomas   . Malignant neoplasm of prostate ([place])   . Pulmonary embolism ([place])     04/2009  . Asperger's syndrome     symptoms of  . Herpes simplex type II infection    Past Surgical History  Procedure Laterality Date  . Hip repalcement      2012  . Robotic prostatectomy      05/21/2009  . Shoulder open rotator cuff repair    . Cataract extraction, bilateral     Family History  Problem Relation Age of Onset  . Heart disease    . Hyperlipidemia    . Diabetes      ?  Marland Kitchen Diabetes Mother   . Hypertension Mother   . Rheum arthritis Mother   . Cancer Father   . Heart failure Father     "heart trouble"   Social History  Substance Use Topics  . Smoking status: Former Smoker -- 1.00 packs/day    Types: Cigarettes    Quit date: 03/29/2002  . Smokeless tobacco: Never Used  . Alcohol Use: 1.5 - 2.5 oz/week    3-5 drink(s) per week    Review of Systems  All other systems reviewed and are negative.     Allergies  Review of patient's allergies indicates no known allergies.  Home Medications   Prior to Admission medications   Medication Sig Start Date End Date Taking? Authorizing Provider  meloxicam (MOBIC) 15 MG tablet Take 1 tablet (15 mg total)  by mouth daily. Patient taking differently: Take 15 mg by mouth as needed for pain.  01/05/15  Yes [name], MD  rosuvastatin (CRESTOR) 40 MG tablet TAKE 1 TABLET BY MOUTH DAILY 01/29/15  Yes Robyn Haber, MD  sertraline (ZOLOFT) 100 MG tablet 1 daily 01/29/15  Yes Robyn Haber, MD  tadalafil (CIALIS) 5 MG tablet Take 1 tablet (5 mg total) by mouth daily as needed. 03/05/14  Yes Jaynee Eagles, PA-C  HYDROcodone-acetaminophen (NORCO) 5-325 MG tablet Take 1 tablet by mouth every 6 (six) hours as needed for moderate pain. Patient not taking: Reported on 01/29/2015 01/05/15   [name], MD  valACYclovir (VALTREX) 500 MG tablet Take 1 tablet (500 mg total) by mouth daily. Patient not taking: Reported on 09/07/2015 12/16/13   [name], MD   BP 117/73 mmHg  Pulse 75  Temp(Src) 98.3 F (36.8 C) (Oral)  Resp 19  SpO2 93% Physical Exam  Constitutional: He is oriented to person, place, and time. He appears well-developed and well-nourished. No distress.  HENT:  Head: Normocephalic and atraumatic.  Eyes: Pupils are equal, round, and reactive to light.  Neck: Normal  range of motion.  Cardiovascular: Normal rate and intact distal pulses.   Pulmonary/Chest: No respiratory distress.  Abdominal: Normal appearance. He exhibits no distension.  Musculoskeletal: Normal range of motion.  Neurological: He is alert and oriented to person, place, and time. No cranial nerve deficit.  Skin: Skin is warm and dry. No rash noted.  Psychiatric: He has a normal mood and affect. His speech is normal and behavior is normal. He expresses no homicidal and no suicidal ideation.  Nursing note and vitals reviewed.   ED Course  Procedures (including critical care time) Labs Review Labs Reviewed - No data to display     EKG Interpretation   Date/Time:  Sunday September 07 2015 23:48:39 EDT Ventricular Rate:  77 PR Interval:  175 QRS Duration: 106 QT Interval:  380 QTC Calculation: 430 R Axis:   4 Text  Interpretation:  Sinus rhythm Normal ECG Confirmed by Trevyon Swor  MD,  Tyjai Matuszak ([id number]) on 09/07/2015 11:55:30 PM     Poison Control contacted. Representative states that pt may be more drowsy than normal, but no life-threatening effects are expected. MD Audie Pinto notified of this information MDM   Final diagnoses:  None        Leonard Schwartz, MD 09/08/15 0008

## 2015-09-08 NOTE — Discharge Instructions (Signed)
Nontoxic Ingestion Nontoxic ingestion means that the substance you have swallowed (ingested) is not likely to cause serious medical problems. Further treatment is not needed at this time. However, the effects of drugs or other substances can sometimes be delayed, so you should watch your condition for any changes. HOME CARE INSTRUCTIONS  Take medicines only as directed by your health care provider.  Follow instructions from your health care provider about eating or drinking restrictions. If you have vomited since ingesting the substance, you may need to avoid eating or drinking for a few hours. After that, you can start with small sips of clear liquids until your stomach settles.  Do not drink alcohol or use illegal drugs.  Keep all follow-up visits as directed by your health care provider. This is important. SEEK MEDICAL CARE IF:  You have a fever.  You have vomiting. SEEK IMMEDIATE MEDICAL CARE IF:  You have difficulty walking.  You have confusion or agitation.  You are overly tired.  You have difficulty breathing.  You have difficulty swallowing or you have a lot of mucus.  You have a seizure.  You have profuse sweating.  You have a cough.  You have abdominal pain, repeated vomiting, or severe diarrhea.  You have weakness.  You have a fast or irregular heartbeat (palpitations).  You have signs of dehydration, such as:  Severe thirst.  Dry lips and mouth.  Dizziness.  Dark urine or a decreasing need to urinate.  Rapid breathing or rapid pulse.   This information is not intended to replace advice given to you by your health care provider. Make sure you discuss any questions you have with your health care provider.   Document Released: 04/22/2004 Document Revised: 07/30/2014 Document Reviewed: 02/06/2014 Elsevier Interactive Patient Education [employer].

## 2016-01-19 DIAGNOSIS — L821 Other seborrheic keratosis: Secondary | ICD-10-CM | POA: Diagnosis not present

## 2016-01-19 DIAGNOSIS — L812 Freckles: Secondary | ICD-10-CM | POA: Diagnosis not present

## 2016-01-19 DIAGNOSIS — Z85820 Personal history of malignant melanoma of skin: Secondary | ICD-10-CM | POA: Diagnosis not present

## 2016-05-12 DIAGNOSIS — C61 Malignant neoplasm of prostate: Secondary | ICD-10-CM | POA: Diagnosis not present

## 2016-05-19 DIAGNOSIS — N486 Induration penis plastica: Secondary | ICD-10-CM | POA: Diagnosis not present

## 2016-05-19 DIAGNOSIS — N5201 Erectile dysfunction due to arterial insufficiency: Secondary | ICD-10-CM | POA: Diagnosis not present

## 2016-05-19 DIAGNOSIS — R6882 Decreased libido: Secondary | ICD-10-CM | POA: Diagnosis not present

## 2016-05-19 DIAGNOSIS — Z8546 Personal history of malignant neoplasm of prostate: Secondary | ICD-10-CM | POA: Diagnosis not present

## 2016-06-01 DIAGNOSIS — S83207D Unspecified tear of unspecified meniscus, current injury, left knee, subsequent encounter: Secondary | ICD-10-CM | POA: Diagnosis not present

## 2016-06-17 ENCOUNTER — Ambulatory Visit (INDEPENDENT_AMBULATORY_CARE_PROVIDER_SITE_OTHER): Payer: BLUE CROSS/BLUE SHIELD | Admitting: Family Medicine

## 2016-06-17 ENCOUNTER — Encounter: Payer: Self-pay | Admitting: Family Medicine

## 2016-06-17 ENCOUNTER — Ambulatory Visit (INDEPENDENT_AMBULATORY_CARE_PROVIDER_SITE_OTHER): Payer: BLUE CROSS/BLUE SHIELD

## 2016-06-17 VITALS — BP 126/86 | HR 64 | Temp 98.2°F | Resp 16 | Ht 75.0 in | Wt 285.6 lb

## 2016-06-17 DIAGNOSIS — Z01818 Encounter for other preprocedural examination: Secondary | ICD-10-CM

## 2016-06-17 DIAGNOSIS — F17290 Nicotine dependence, other tobacco product, uncomplicated: Secondary | ICD-10-CM

## 2016-06-17 DIAGNOSIS — Z8619 Personal history of other infectious and parasitic diseases: Secondary | ICD-10-CM

## 2016-06-17 DIAGNOSIS — F3289 Other specified depressive episodes: Secondary | ICD-10-CM | POA: Diagnosis not present

## 2016-06-17 DIAGNOSIS — E785 Hyperlipidemia, unspecified: Secondary | ICD-10-CM | POA: Diagnosis not present

## 2016-06-17 DIAGNOSIS — Z130 Encounter for screening for diseases of the blood and blood-forming organs and certain disorders involving the immune mechanism: Secondary | ICD-10-CM | POA: Diagnosis not present

## 2016-06-17 MED ORDER — ROSUVASTATIN CALCIUM 40 MG PO TABS: ORAL_TABLET | ORAL | 1 refills | Status: DC

## 2016-06-17 MED ORDER — SERTRALINE HCL 100 MG PO TABS: ORAL_TABLET | ORAL | 1 refills | Status: DC

## 2016-06-17 MED ORDER — VALACYCLOVIR HCL 500 MG PO TABS: 500.0000 mg | ORAL_TABLET | Freq: Every day | ORAL | 11 refills | Status: DC

## 2016-06-17 NOTE — Progress Notes (Signed)
By signing my name below, I, Mesha Guinyard, attest that this documentation has been prepared under the direction and in the presence of Merri Ray, MD.  Electronically Signed: Verlee Monte, Medical Scribe. 06/17/16. 12:04 PM.  Subjective:    Patient ID: Scott [name], male    DOB: [birth year], 56 y.o.   MRN: [id number]  HPI Chief Complaint  Patient presents with  . Establish Care    needs sugical clearance for  lft knee replacement     HPI Comments: Scott [name] is a 56 y.o. male with a PMHx of HLD, obesity, anger, and HSV-2 who presents to the Primary Care at [facility] and [facility] for moderate risk pre-operative evaluation for right knee. He is a new pt to me; prev pt of Dr. Everlene Farrier. He had a EKG September 07, 2015- sinus rhythm no acute findings, no recent CXR. Last renal function, electrolytes, and CBC April 217, all nl. He was treated with crestor in the past for HLD. Pt is not fasting; Had a cup of coffee and yogurt earlier today.  Was seen 2 weeks ago for left knee pain and was told 2/3 of his knee cartilage is gone. Dr. Mayer Camel will be his surgeon and he is trying to schedule surgery June 18th. Pt mainly plays golf, no strenuous activity. Was told he snores- denies sleep apnea, day time somnolence, or needing substances to stay awake. Smokes cigars 1x a week when he plays golf. Denies chest pain and SOB after walking up 2 flights of steps.   Bilateral ear issues: For the past month. Pt saw a PA at work 2 weeks ago at was told there was fluid in his ears. He was recommended mucinex and zyrtec with min relief of his sxs.  HLD: Takes crestor QD without any recent missed doses.  Lab Results  Component Value Date   CHOL 203 (H) 07/10/2015   HDL 39 (L) 07/10/2015   LDLCALC 133 (H) 07/10/2015   TRIG 156 (H) 07/10/2015   CHOLHDL 5.2 (H) 07/10/2015   Lab Results  Component Value Date   ALT 17 07/10/2015   AST 19 07/10/2015   ALKPHOS 73 07/10/2015   BILITOT 0.6  07/10/2015   Depression/Anger: Pt is on zoloft for relief of his depression and anger. Denies experiencing negative side effects.  HSV-2: Takes valtrex PRN. His last outbreak has been months ago.  ED: Takes cialis 1-2x a week. Denies chest pain, or other negative side effects.  Surgical Hx: Pt has meniscus replacement Oct 2016, and replaced his right hip without anesthesia issues. Pt had prostates surgery and developed a blood clot after the surgery.  Patient Active Problem List   Diagnosis Date Noted  . Obesity 07/10/2015  . Anger 12/16/2013  . Hyperlipidemia   . Unspecified vitamin D deficiency   . Herpes simplex type II infection    Past Medical History:  Diagnosis Date  . Asperger's syndrome    symptoms of  . Herpes simplex type II infection   . Hyperlipidemia   . Malignant neoplasm of prostate ([place])   . Multiple lipomas   . Premature ejaculation    07/2004  . Pulmonary embolism ([place])    04/2009  . Unspecified vitamin D deficiency    Past Surgical History:  Procedure Laterality Date  . CATARACT EXTRACTION, BILATERAL    . hip repalcement     2012  . KNEE ARTHROSCOPY W/ MENISCAL REPAIR Left 12/2014  . robotic prostatectomy     05/21/2009  .  SHOULDER OPEN ROTATOR CUFF REPAIR     No Known Allergies Prior to Admission medications   Medication Sig Start Date End Date Taking? Authorizing Provider  rosuvastatin (CRESTOR) 40 MG tablet TAKE 1 TABLET BY MOUTH DAILY 01/29/15  Yes Robyn Haber, MD  sertraline (ZOLOFT) 100 MG tablet 1 daily 01/29/15  Yes Robyn Haber, MD  tadalafil (CIALIS) 5 MG tablet Take 1 tablet (5 mg total) by mouth daily as needed. 03/05/14  Yes Jaynee Eagles, PA-C  valACYclovir (VALTREX) 500 MG tablet Take 1 tablet (500 mg total) by mouth daily. 12/16/13  Yes [name], MD  HYDROcodone-acetaminophen (NORCO) 5-325 MG tablet Take 1 tablet by mouth every 6 (six) hours as needed for moderate pain. Patient not taking: Reported on 01/29/2015 01/05/15    [name], MD  meloxicam (MOBIC) 15 MG tablet Take 1 tablet (15 mg total) by mouth daily. Patient not taking: Reported on 06/17/2016 01/05/15   [name], MD   Social History   Social History  . Marital status: Married    Spouse name: N/A  . Number of children: N/A  . Years of education: N/A   Occupational History  . Not on file.   Social History Main Topics  . Smoking status: Former Smoker    Packs/day: 1.00    Types: Cigarettes    Quit date: 03/29/2002  . Smokeless tobacco: Never Used  . Alcohol use 1.5 - 2.5 oz/week    3 - 5 Standard drinks or equivalent per week  . Drug use: No  . Sexual activity: Not on file   Other Topics Concern  . Not on file   Social History Narrative  . No narrative on file   Review of Systems  Constitutional: Negative for fatigue.  HENT: Positive for ear pain.   Respiratory: Negative for shortness of breath.   Cardiovascular: Negative for chest pain.  Musculoskeletal: Positive for arthralgias.  Psychiatric/Behavioral: Negative for agitation.    Objective:  Physical Exam  Constitutional: He appears well-developed and well-nourished. No distress.  HENT:  Head: Normocephalic and atraumatic.  Right Ear: Tympanic membrane is not erythematous.  Left Ear: Tympanic membrane is not erythematous.  Slightly dull right TM No erythema  Eyes: Conjunctivae are normal.  Neck: Neck supple.  Cardiovascular: Normal rate and normal heart sounds.  Exam reveals no gallop and no friction rub.   No murmur heard. Pulmonary/Chest: Effort normal and breath sounds normal. No respiratory distress. He has no wheezes. He has no rales.  Neurological: He is alert.  Skin: Skin is warm and dry.  Psychiatric: He has a normal mood and affect. His behavior is normal.  Nursing note and vitals reviewed.   Vitals:   06/17/16 1107  BP: 126/86  Pulse: 64  Resp: 16  Temp: 98.2 F (36.8 C)  TempSrc: Oral  SpO2: 96%  Weight: 285 lb 9.6 oz (129.5 kg)  Height: 6'  3" (1.905 m)  Body mass index is 35.7 kg/m. Assessment & Plan:   Scott [name] is a 56 y.o. male Preop examination - Plan: EKG 12-Lead, DG Chest 2 View  - no concerns on EKG., exam. Able to achieve greater than 6 mets activitiy without difficulty.  No apparent increase in risk of major adverse cardiac event. Labs pending. May need anticoagulant after surgery to decrease risk of DVT.   Hyperlipidemia, unspecified hyperlipidemia type - Plan: Comprehensive metabolic panel, Lipid panel, EKG 12-Lead, rosuvastatin (CRESTOR) 40 MG tablet  - cont crestor, lipids pending.  Cigar smoker - Plan: DG Chest 2 View  - no concerns on CXR  History of herpes genitalis - Plan: valACYclovir (VALTREX) 500 MG tablet  - cont valtrex. Consider daily use if increased flairs.   Screening, anemia, deficiency, iron - Plan: CBC  Other depression - Plan: sertraline (ZOLOFT) 100 MG tablet  - overall stable, cont zoloft same dose.   Meds ordered this encounter  Medications  . rosuvastatin (CRESTOR) 40 MG tablet    Sig: TAKE 1 TABLET BY MOUTH DAILY    Dispense:  90 tablet    Refill:  1  . sertraline (ZOLOFT) 100 MG tablet    Sig: 1 daily    Dispense:  90 tablet    Refill:  1  . valACYclovir (VALTREX) 500 MG tablet    Sig: Take 1 tablet (500 mg total) by mouth daily.    Dispense:  30 tablet    Refill:  11   Patient Instructions   You may need to be on an anticoagulant medication after surgery to lessen risk of blood clot.  Once labs have returned, can complete your paperwork for surgeon. You can either return here for fasting labs, or if they can provide those at work, bring me a copy of the lab results.  I will check your cholesterol level, but continue Crestor once per day for now. Follow-up within 6 months a physical.    IF you received an x-ray today, you will receive an invoice from [facility]. Please contact [facility] at [phone] with questions or concerns regarding  your invoice.   IF you received labwork today, you will receive an invoice from [facility]. Please contact [facility] at [phone] with questions or concerns regarding your invoice.   Our billing staff will not be able to assist you with questions regarding bills from these companies.  You will be contacted with the lab results as soon as they are available. The fastest way to get your results is to activate your My Chart account. Instructions are located on the last page of this paperwork. If you have not heard from [place] regarding the results in 2 weeks, please contact this office.       I personally performed the services described in this documentation, which was scribed in my presence. The recorded information has been reviewed and considered for accuracy and completeness, addended by me as needed, and agree with information above.  Signed,   Merri Ray, MD [facility] at [facility].  06/19/16 11:09 PM

## 2016-06-17 NOTE — Patient Instructions (Addendum)
You may need to be on an anticoagulant medication after surgery to lessen risk of blood clot.  Once labs have returned, can complete your paperwork for surgeon. You can either return here for fasting labs, or if they can provide those at work, bring me a copy of the lab results.  I will check your cholesterol level, but continue Crestor once per day for now. Follow-up within 6 months a physical.    IF you received an x-ray today, you will receive an invoice from [facility]. Please contact [facility] at [phone] with questions or concerns regarding your invoice.   IF you received labwork today, you will receive an invoice from [facility]. Please contact [facility] at [phone] with questions or concerns regarding your invoice.   Our billing staff will not be able to assist you with questions regarding bills from these companies.  You will be contacted with the lab results as soon as they are available. The fastest way to get your results is to activate your My Chart account. Instructions are located on the last page of this paperwork. If you have not heard from [place] regarding the results in 2 weeks, please contact this office.

## 2016-07-19 DIAGNOSIS — L821 Other seborrheic keratosis: Secondary | ICD-10-CM | POA: Diagnosis not present

## 2016-07-19 DIAGNOSIS — L814 Other melanin hyperpigmentation: Secondary | ICD-10-CM | POA: Diagnosis not present

## 2016-07-19 DIAGNOSIS — D225 Melanocytic nevi of trunk: Secondary | ICD-10-CM | POA: Diagnosis not present

## 2016-07-19 DIAGNOSIS — D1800 Hemangioma unspecified site: Secondary | ICD-10-CM | POA: Diagnosis not present

## 2016-07-28 ENCOUNTER — Other Ambulatory Visit: Payer: BLUE CROSS/BLUE SHIELD | Admitting: *Deleted

## 2016-07-28 DIAGNOSIS — E785 Hyperlipidemia, unspecified: Secondary | ICD-10-CM

## 2016-07-28 DIAGNOSIS — Z130 Encounter for screening for diseases of the blood and blood-forming organs and certain disorders involving the immune mechanism: Secondary | ICD-10-CM

## 2016-07-29 LAB — LIPID PANEL
CHOLESTEROL TOTAL: 236 mg/dL — AB (ref 100–199)
Chol/HDL Ratio: 5.5 ratio — ABNORMAL HIGH (ref 0.0–5.0)
HDL: 43 mg/dL (ref >39)
LDL CALC: 153 mg/dL — AB (ref 0–99)
TRIGLYCERIDES: 198 mg/dL — AB (ref 0–149)
VLDL CHOLESTEROL CAL: 40 mg/dL (ref 5–40)

## 2016-07-29 LAB — COMPREHENSIVE METABOLIC PANEL
ALK PHOS: 90 IU/L (ref 39–117)
ALT: 27 IU/L (ref 0–44)
AST: 22 IU/L (ref 0–40)
Albumin/Globulin Ratio: 1.9 (ref 1.2–2.2)
Albumin: 4.5 g/dL (ref 3.5–5.5)
BUN / CREAT RATIO: 13 (ref 9–20)
BUN: 11 mg/dL (ref 6–24)
Bilirubin Total: 0.4 mg/dL (ref 0.0–1.2)
CHLORIDE: 101 mmol/L (ref 96–106)
CO2: 25 mmol/L (ref 18–29)
CREATININE: 0.85 mg/dL (ref 0.76–1.27)
Calcium: 9.3 mg/dL (ref 8.7–10.2)
GFR calc Af Amer: 113 mL/min/{1.73_m2} (ref >59)
GFR calc non Af Amer: 97 mL/min/{1.73_m2} (ref >59)
GLUCOSE: 101 mg/dL — AB (ref 65–99)
Globulin, Total: 2.4 g/dL (ref 1.5–4.5)
Potassium: 5 mmol/L (ref 3.5–5.2)
Sodium: 140 mmol/L (ref 134–144)
TOTAL PROTEIN: 6.9 g/dL (ref 6.0–8.5)

## 2016-07-29 LAB — CBC
Hematocrit: 44.9 % (ref 37.5–51.0)
Hemoglobin: 15 g/dL (ref 13.0–17.7)
MCH: 31.4 pg (ref 26.6–33.0)
MCHC: 33.4 g/dL (ref 31.5–35.7)
MCV: 94 fL (ref 79–97)
Platelets: 161 10*3/uL (ref 150–379)
RBC: 4.78 x10E6/uL (ref 4.14–5.80)
RDW: 13.5 % (ref 12.3–15.4)
WBC: 5.9 10*3/uL (ref 3.4–10.8)

## 2016-08-12 ENCOUNTER — Telehealth: Payer: Self-pay | Admitting: Family Medicine

## 2016-08-12 NOTE — Telephone Encounter (Signed)
There is a medical clearance for pt in your box at 102 from [place]. Let me know when completed and I will fax back.

## 2016-08-13 NOTE — Telephone Encounter (Signed)
Checking on status of this message.

## 2016-08-13 NOTE — Telephone Encounter (Signed)
I am back in office tomorrow and will review it then.

## 2016-08-14 NOTE — Telephone Encounter (Signed)
form completed, placed in fax box in provider lounge.

## 2016-09-08 DIAGNOSIS — M1712 Unilateral primary osteoarthritis, left knee: Secondary | ICD-10-CM | POA: Diagnosis not present

## 2016-09-13 DIAGNOSIS — Z96651 Presence of right artificial knee joint: Secondary | ICD-10-CM | POA: Diagnosis not present

## 2016-09-13 DIAGNOSIS — M1712 Unilateral primary osteoarthritis, left knee: Secondary | ICD-10-CM | POA: Diagnosis not present

## 2016-09-17 DIAGNOSIS — M1712 Unilateral primary osteoarthritis, left knee: Secondary | ICD-10-CM | POA: Diagnosis not present

## 2016-09-20 DIAGNOSIS — M1712 Unilateral primary osteoarthritis, left knee: Secondary | ICD-10-CM | POA: Diagnosis not present

## 2016-09-21 DIAGNOSIS — M1712 Unilateral primary osteoarthritis, left knee: Secondary | ICD-10-CM | POA: Diagnosis not present

## 2016-09-24 DIAGNOSIS — M1712 Unilateral primary osteoarthritis, left knee: Secondary | ICD-10-CM | POA: Diagnosis not present

## 2016-09-27 DIAGNOSIS — M1712 Unilateral primary osteoarthritis, left knee: Secondary | ICD-10-CM | POA: Diagnosis not present

## 2016-09-28 DIAGNOSIS — M1712 Unilateral primary osteoarthritis, left knee: Secondary | ICD-10-CM | POA: Diagnosis not present

## 2016-10-01 DIAGNOSIS — M1712 Unilateral primary osteoarthritis, left knee: Secondary | ICD-10-CM | POA: Diagnosis not present

## 2016-10-05 DIAGNOSIS — M1712 Unilateral primary osteoarthritis, left knee: Secondary | ICD-10-CM | POA: Diagnosis not present

## 2016-10-07 DIAGNOSIS — M1712 Unilateral primary osteoarthritis, left knee: Secondary | ICD-10-CM | POA: Diagnosis not present

## 2016-10-12 DIAGNOSIS — M1712 Unilateral primary osteoarthritis, left knee: Secondary | ICD-10-CM | POA: Diagnosis not present

## 2016-10-14 DIAGNOSIS — M1712 Unilateral primary osteoarthritis, left knee: Secondary | ICD-10-CM | POA: Diagnosis not present

## 2016-10-26 DIAGNOSIS — M1711 Unilateral primary osteoarthritis, right knee: Secondary | ICD-10-CM | POA: Diagnosis not present

## 2016-12-20 IMAGING — MR MR KNEE*L* W/O CM
4 of 5 series · 19 of 40 positions shown · non-contrast
Comparison: Radiographs dated 01/05/2015

CLINICAL DATA: Left knee pain and stiffness since an injury while
playing golf 3 weeks ago.

EXAM:
MRI OF THE LEFT KNEE WITHOUT CONTRAST
TECHNIQUE: Multiplanar, multisequence MR imaging of the knee was performed. No
intravenous contrast was administered.

[Series 4: PD fat-sat · coronal · 3.5mm · 0.29mm/px · 8 of 26 slices shown (1 of 3)]
[im 1/26]
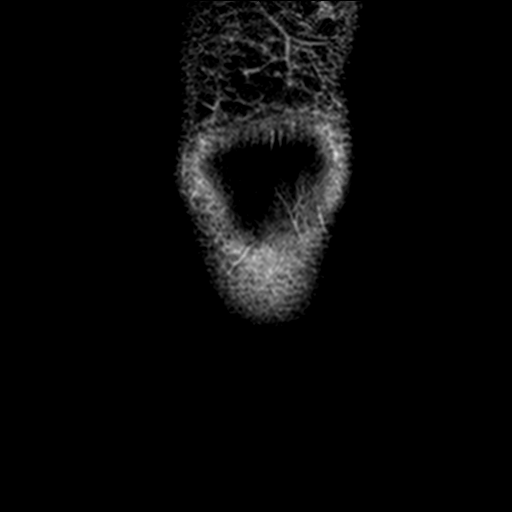
[im 4/26]
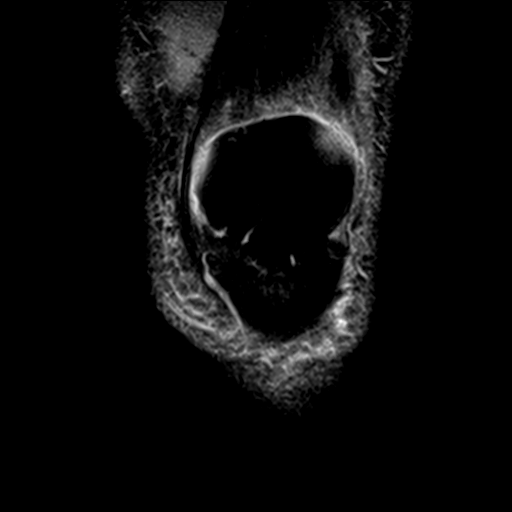
[im 8/26]
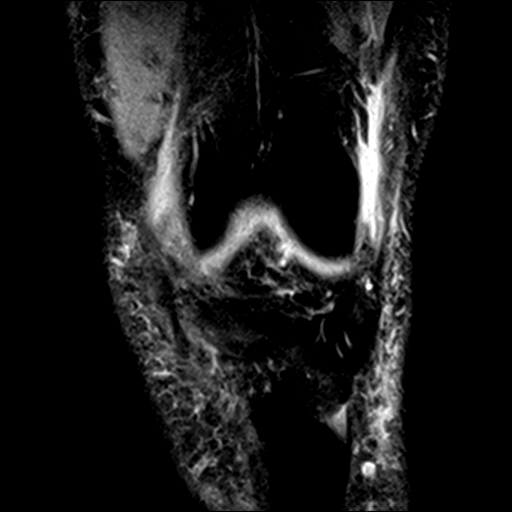
[im 11/26]
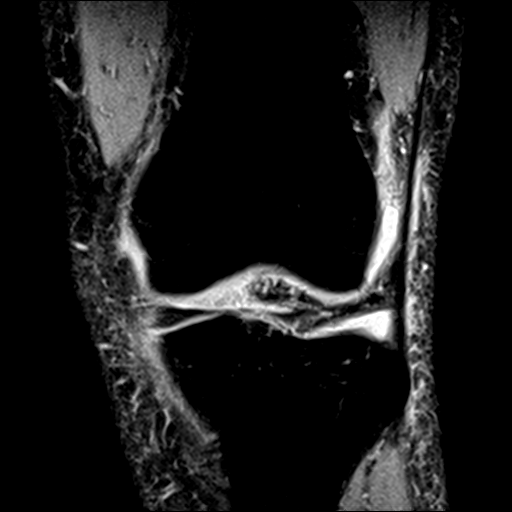
[im 15/26]
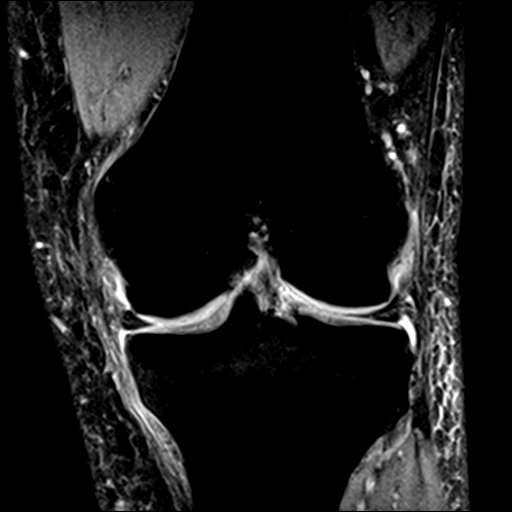
[im 18/26]
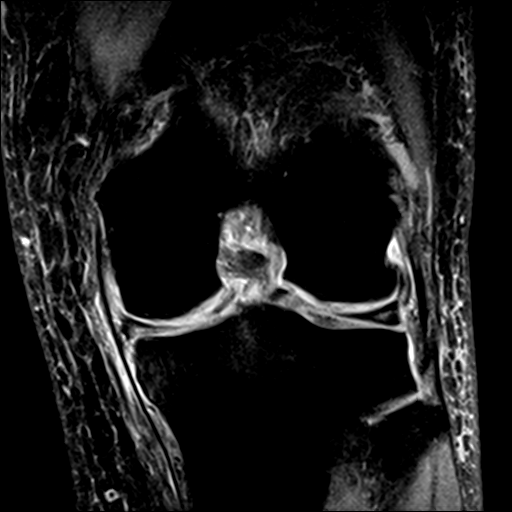
[im 22/26]
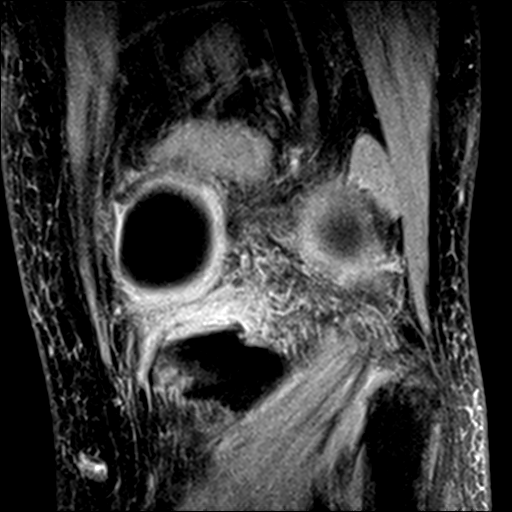
[im 26/26]
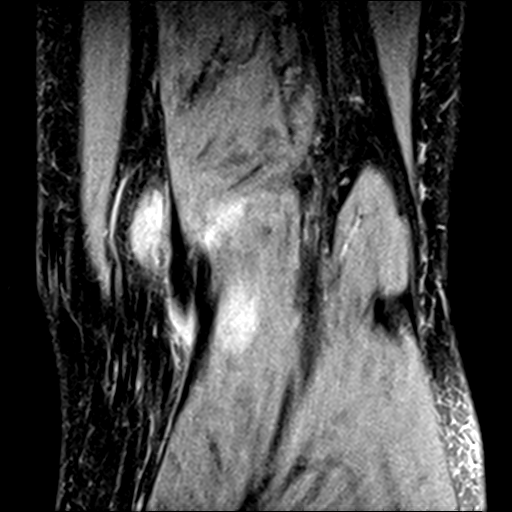

[Series 5: T2 fat-sat · coronal · 3.5mm · 0.29mm/px · 3 of 26 slices shown]
[im 4/26]
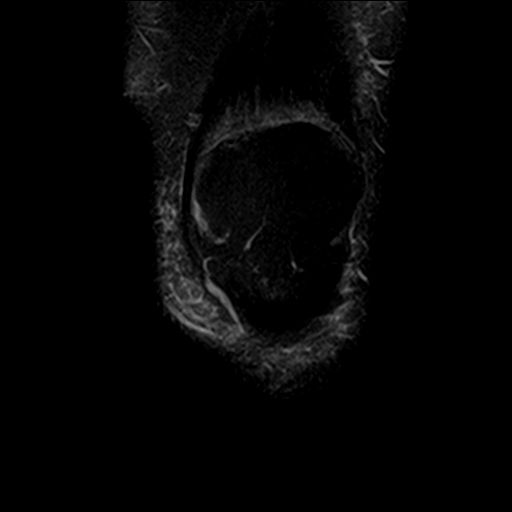
[im 15/26]
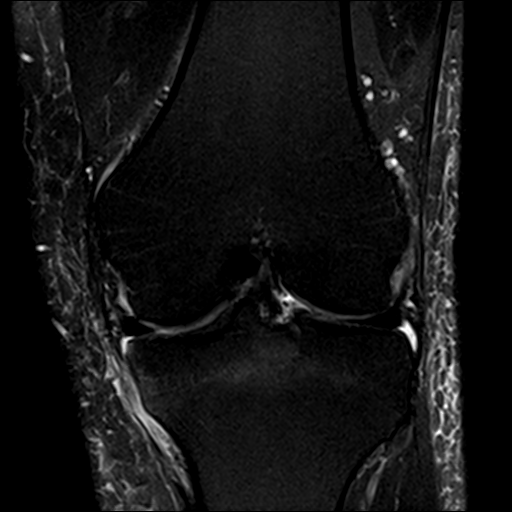
[im 22/26]
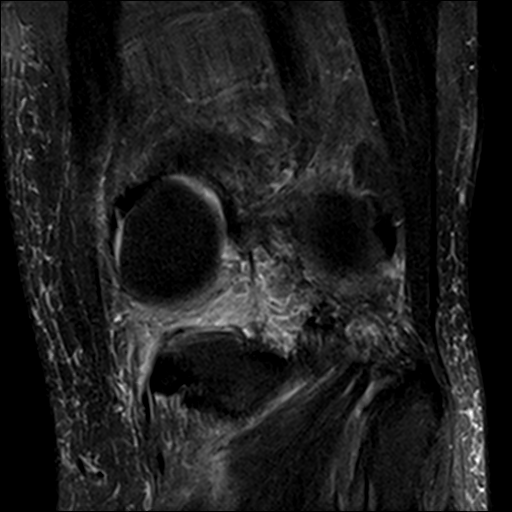

[Series 6: PD fat-sat · sagittal · 3.2mm · 0.29mm/px · 5 of 28 slices shown (2 of 3)]
[im 1/28]
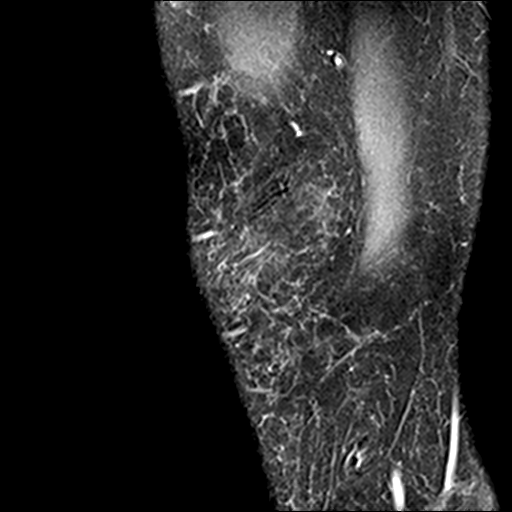
[im 4/28]
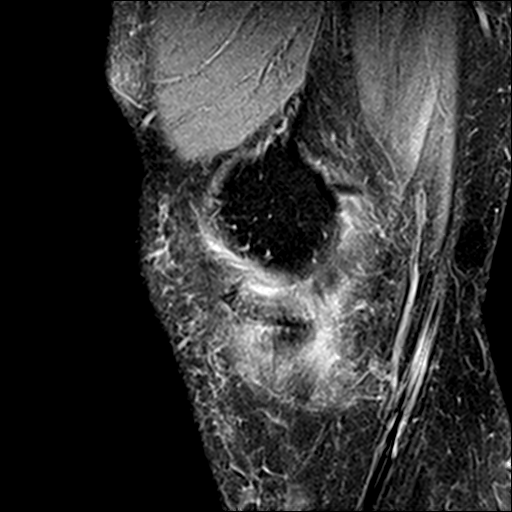
[im 7/28]
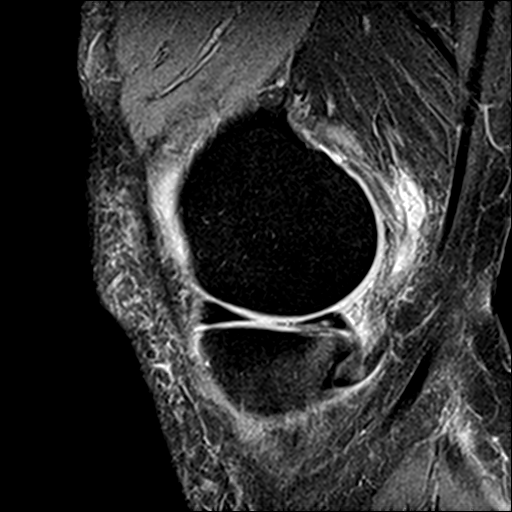
[im 14/28]
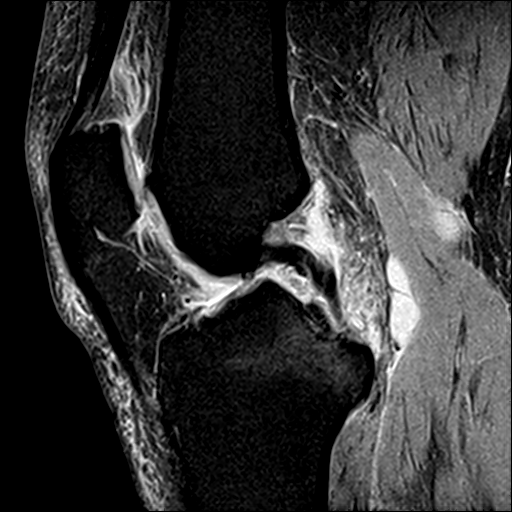
[im 24/28]
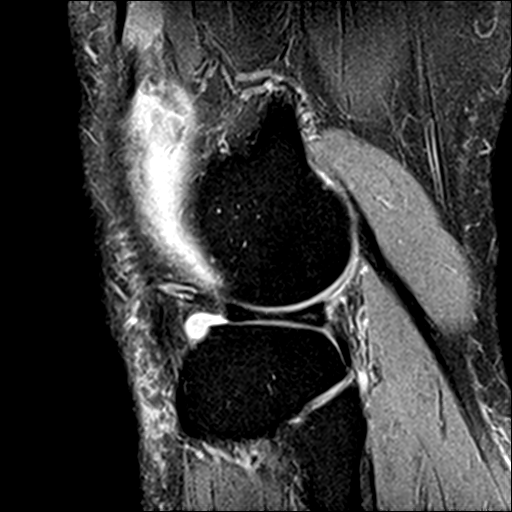

[Series 8: PD fat-sat · axial · 3.5mm · 0.31mm/px · z∈[-5,+58]mm · 3 of 23 slices shown (3 of 3)]
[im 4/23]
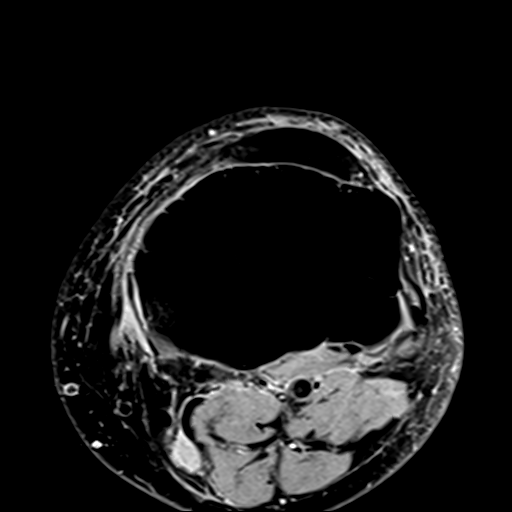
[im 12/23]
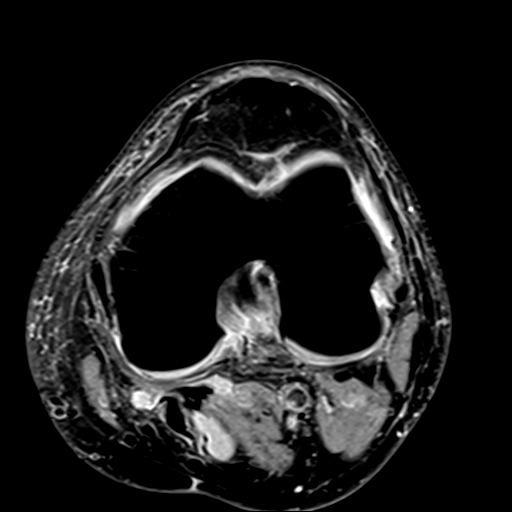
[im 19/23]
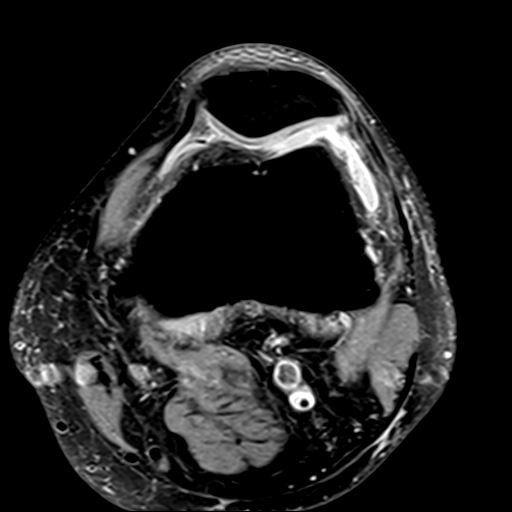

[19 of 40 positions shown; findings below may reference images not displayed]

FINDINGS: MENISCI

Medial meniscus: There is an undersurface tear of the midbody and
posterior horn with a displaced Tiger tear adjacent to the
meniscal root. The torn component is flipped above the meniscal root
best seen on image 8 of series 5 and image 17 of series 6.

Lateral meniscus:  Normal.

LIGAMENTS

Cruciates:  Normal.

Collaterals:  Normal.

CARTILAGE

Patellofemoral:  Normal.

Medial:  Normal.

Lateral: 7 x 5 mm area of partial-thickness cartilage loss of the
posterior central aspect femoral condyle.

Joint:  Small joint effusion.

Popliteal Fossa:  Small Baker's cyst.

Extensor Mechanism:  Normal.

Bones:  Normal.
IMPRESSION: Displaced Tiger tear of the posterior horn of the medial
meniscus.

Undersurface horizontal tear of the midbody and posterior horn of
the medial meniscus.

Focal chondromalacia of the lateral femoral condyle.

## 2016-12-21 ENCOUNTER — Ambulatory Visit (INDEPENDENT_AMBULATORY_CARE_PROVIDER_SITE_OTHER): Payer: BLUE CROSS/BLUE SHIELD | Admitting: Family Medicine

## 2016-12-21 ENCOUNTER — Encounter: Payer: Self-pay | Admitting: Family Medicine

## 2016-12-21 VITALS — BP 132/82 | HR 75 | Temp 98.2°F | Resp 17 | Ht 75.5 in | Wt 280.0 lb

## 2016-12-21 DIAGNOSIS — E669 Obesity, unspecified: Secondary | ICD-10-CM | POA: Diagnosis not present

## 2016-12-21 DIAGNOSIS — F3289 Other specified depressive episodes: Secondary | ICD-10-CM | POA: Diagnosis not present

## 2016-12-21 DIAGNOSIS — R400 Somnolence: Secondary | ICD-10-CM

## 2016-12-21 DIAGNOSIS — Z0000 Encounter for general adult medical examination without abnormal findings: Secondary | ICD-10-CM

## 2016-12-21 DIAGNOSIS — R5383 Other fatigue: Secondary | ICD-10-CM

## 2016-12-21 DIAGNOSIS — R454 Irritability and anger: Secondary | ICD-10-CM

## 2016-12-21 DIAGNOSIS — R0683 Snoring: Secondary | ICD-10-CM

## 2016-12-21 DIAGNOSIS — Z6834 Body mass index (BMI) 34.0-34.9, adult: Secondary | ICD-10-CM

## 2016-12-21 DIAGNOSIS — E785 Hyperlipidemia, unspecified: Secondary | ICD-10-CM

## 2016-12-21 DIAGNOSIS — Z23 Encounter for immunization: Secondary | ICD-10-CM

## 2016-12-21 MED ORDER — ROSUVASTATIN CALCIUM 40 MG PO TABS: ORAL_TABLET | ORAL | 1 refills | Status: DC

## 2016-12-21 MED ORDER — SERTRALINE HCL 100 MG PO TABS: ORAL_TABLET | ORAL | 1 refills | Status: DC

## 2016-12-21 NOTE — Patient Instructions (Addendum)
I would recommend meeting with counselor/therapist to discuss some of the concerns on your paper today, but also will refer you to sleep specialist to check for sleep apnea. Please follow up to discuss possible memory concerns, and other information on your paper further in the next month  Return to the clinic or go to the nearest emergency room if any of your symptoms worsen or new symptoms occur.  Keep routine follow up with urology.   Low intensity exercise most days per week, including swimming as option most days per week.   Limit alcohol to no more than 2 drinks per day.   Keeping you healthy  Get these tests  Blood pressure- Have your blood pressure checked once a year by your healthcare provider.  Normal blood pressure is 120/80  Weight- Have your body mass index (BMI) calculated to screen for obesity.  BMI is a measure of body fat based on height and weight. You can also calculate your own BMI at [email or web address].  Cholesterol- Have your cholesterol checked every year.  Diabetes- Have your blood sugar checked regularly if you have high blood pressure, high cholesterol, have a family history of diabetes or if you are overweight.  Screening for Colon Cancer- Colonoscopy starting at age 45.  Screening may begin sooner depending on your family history and other health conditions. Follow up colonoscopy as directed by your Gastroenterologist.  Screening for Prostate Cancer- Both blood work (PSA) and a rectal exam help screen for Prostate Cancer.  Screening begins at age 30 with African-American men and at age 67 with Caucasian men.  Screening may begin sooner depending on your family history.  Take these medicines  Aspirin- One aspirin daily can help prevent Heart disease and Stroke.  Flu shot- Every fall.  Tetanus- Every 10 years.  Zostavax- Once after the age of 33 to prevent Shingles.  Pneumonia shot- Once after the age of 65; if you are younger than 77, ask your  healthcare provider if you need a Pneumonia shot.  Take these steps  Don't smoke- If you do smoke, talk to your doctor about quitting.  For tips on how to quit, go to www.smokefree.gov or call 1-800-QUIT-NOW.  Be physically active- Exercise 5 days a week for at least 30 minutes.  If you are not already physically active start slow and gradually work up to 30 minutes of moderate physical activity.  Examples of moderate activity include walking briskly, mowing the yard, dancing, swimming, bicycling, etc.  Eat a healthy diet- Eat a variety of healthy food such as fruits, vegetables, low fat milk, low fat cheese, yogurt, lean meant, poultry, fish, beans, tofu, etc. For more information go to www.thenutritionsource.org  Drink alcohol in moderation- Limit alcohol intake to less than two drinks a day. Never drink and drive.  Dentist- Brush and floss twice daily; visit your dentist twice a year.  Depression- Your emotional health is as important as your physical health. If you're feeling down, or losing interest in things you would normally enjoy please talk to your healthcare provider.  Eye exam- Visit your eye doctor every year.  Safe sex- If you may be exposed to a sexually transmitted infection, use a condom.  Seat belts- Seat belts can save your life; always wear one.  Smoke/Carbon Monoxide detectors- These detectors need to be installed on the appropriate level of your home.  Replace batteries at least once a year.  Skin cancer- When out in the sun, cover up and use sunscreen 15 SPF  or higher.  Violence- If anyone is threatening you, please tell your healthcare provider.  Living Will/ Health care power of attorney- Speak with your healthcare provider and family.      IF you received an x-ray today, you will receive an invoice from [facility]. Please contact [facility] at [phone] with questions or concerns regarding your invoice.   IF you received labwork  today, you will receive an invoice from [place]. Please contact [facility] at [phone] with questions or concerns regarding your invoice.   Our billing staff will not be able to assist you with questions regarding bills from these companies.  You will be contacted with the lab results as soon as they are available. The fastest way to get your results is to activate your My Chart account. Instructions are located on the last page of this paperwork. If you have not heard from [place] regarding the results in 2 weeks, please contact this office.

## 2016-12-21 NOTE — Progress Notes (Signed)
Subjective:  By signing my name below, I, [name], attest that this documentation has been prepared under the direction and in the presence of [name], MD Electronically Signed: Ladene Artist, ED Scribe 12/21/2016 at 10:35 AM.   Patient ID: Scott [name], male    DOB: [birth year], 56 y.o.   MRN: [id number]  Chief Complaint  Patient presents with  . Annual Exam   HPI Scott [name] is a 56 y.o. male who presents to Primary Care at [facility] for an annual exam. H/o hyperlipidemia, h/o prostate CA s/p prostatectomy and has used Zoloft for anger in the past that was decreased to 50 mg at prior CPE with Dr. Everlene Farrier in 06/2015. Pt had left knee surgery 6/18.   CA Screening Coloscopy: 2017, 1 diverticula, medium hemorrhoid. Repeat in 10 years Prostate CA Screening: h/o prostate CA. Followed by uriologist Dr. Alinda Money annually with the last visit being in February, prostatectomy in 2011.  No results found for: PSA Has seen dermatology for evaluation of nevi and h/o melanoma.   Immunizations Immunization History  Administered Date(s) Administered  . Influenza-Unspecified 03/29/2010, 12/27/2013, 12/28/2015  . Td 10/03/2006  Plans to get influenza vaccine next week at work.  Tdap: Pt is unsure if he has ever had tdap. Denies being around children on a regular basis.  Depression Screening/Anger Depression screen [facility] 2/9 12/21/2016 06/17/2016 07/10/2015 01/29/2015 01/05/2015  Decreased Interest 0 0 0 0 0  Down, Depressed, Hopeless 0 0 0 0 0  PHQ - 2 Score 0 0 0 0 0  Continued on Zoloft 100 mg as of last visit. Pt has been compliant with Zoloft. He presents with 2 pages of concerns from home including conversations, forgetfulness, sleepiness, irritations/anger spells/mood changes, sometimes confused when waking up, snoring nightly. Pt denies changes in his mood; states he only gets irritated when he is accused of things. He also does not feel like he is forgetting conversations but  wife tells him that he is. Per documents that pt presents, wife also reports drops in O2 levels in the hospital while pt was awake. He reports very occasional daytime somnolence with the last being 2 days ago. Pt has not had a sleep study done. He reports 57-32 alcoholic beverages/week. Pt and his wife have met with counselors separately.  Body mass index is 34.54 kg/m.   Visual Acuity Screening   Right eye Left eye Both eyes  Without correction: '20/20 20/25 20/20 '$  With correction:      Vision: eye exam within the past year Dentist: last seen in August Exercise: pt has been doing low intensity exercise in the pool  Hyperlipidemia Lab Results  Component Value Date   CHOL 236 (H) 07/28/2016   HDL 43 07/28/2016   LDLCALC 153 (H) 07/28/2016   TRIG 198 (H) 07/28/2016   CHOLHDL 5.5 (H) 07/28/2016  Continued on Crestor 40 mg qd. Pt is fasting at this visit.  H/o Genital Herpes Uses Valtrex as needed.   Patient Active Problem List   Diagnosis Date Noted  . Obesity 07/10/2015  . Anger 12/16/2013  . Hyperlipidemia   . Unspecified vitamin D deficiency   . Herpes simplex type II infection    Past Medical History:  Diagnosis Date  . Asperger's syndrome    symptoms of  . Herpes simplex type II infection   . Hyperlipidemia   . Malignant neoplasm of prostate ([place])   . Multiple lipomas   . Premature ejaculation    07/2004  .  Pulmonary embolism ([place])    04/2009  . Unspecified vitamin D deficiency    Past Surgical History:  Procedure Laterality Date  . CATARACT EXTRACTION, BILATERAL    . hip repalcement     2012  . KNEE ARTHROSCOPY W/ MENISCAL REPAIR Left 12/2014  . robotic prostatectomy     05/21/2009  . SHOULDER OPEN ROTATOR CUFF REPAIR     No Known Allergies Prior to Admission medications   Medication Sig Start Date End Date Taking? Authorizing Provider  rosuvastatin (CRESTOR) 40 MG tablet TAKE 1 TABLET BY MOUTH DAILY 06/17/16   [name], MD  sertraline (ZOLOFT)  100 MG tablet 1 daily 06/17/16   [name], MD  tadalafil (CIALIS) 5 MG tablet Take 1 tablet (5 mg total) by mouth daily as needed. 03/05/14   Jaynee Eagles, PA-C  valACYclovir (VALTREX) 500 MG tablet Take 1 tablet (500 mg total) by mouth daily. 06/17/16   [name], MD   Social History   Social History  . Marital status: Married    Spouse name: N/A  . Number of children: N/A  . Years of education: N/A   Occupational History  . Not on file.   Social History Main Topics  . Smoking status: Former Smoker    Packs/day: 1.00    Types: Cigarettes    Quit date: 03/29/2002  . Smokeless tobacco: Never Used  . Alcohol use 1.5 - 2.5 oz/week    3 - 5 Standard drinks or equivalent per week  . Drug use: No  . Sexual activity: Not on file   Other Topics Concern  . Not on file   Social History Narrative  . No narrative on file   Review of Systems  Musculoskeletal: Positive for arthralgias (L knee).  Psychiatric/Behavioral: Positive for agitation.      Objective:   Physical Exam  Constitutional: He is oriented to person, place, and time. He appears well-developed and well-nourished.  HENT:  Head: Normocephalic and atraumatic.  Right Ear: External ear normal.  Left Ear: External ear normal.  Mouth/Throat: Oropharynx is clear and moist.  Eyes: Pupils are equal, round, and reactive to light. Conjunctivae and EOM are normal.  Neck: Normal range of motion. Neck supple. No thyromegaly present.  Cardiovascular: Normal rate, regular rhythm, normal heart sounds and intact distal pulses.   Pulmonary/Chest: Effort normal and breath sounds normal. No respiratory distress. He has no wheezes.  Abdominal: Soft. He exhibits no distension. There is no tenderness.  Musculoskeletal: Normal range of motion. He exhibits no edema or tenderness.  Lymphadenopathy:    He has no cervical adenopathy.  Neurological: He is alert and oriented to person, place, and time. He has normal reflexes.  Skin:  Skin is warm and dry.  Psychiatric: He has a normal mood and affect. His behavior is normal.  Vitals reviewed.   Vitals:   12/21/16 0943  BP: 132/82  Pulse: 75  Resp: 17  Temp: 98.2 F (36.8 C)  TempSrc: Oral  SpO2: 98%  Weight: 280 lb (127 kg)  Height: 6' 3.5" (1.918 m)      Assessment & Plan:   Scott [name] is a 56 y.o. male Annual physical exam  - -anticipatory guidance as below in AVS, screening labs above. Health maintenance items as above in HPI discussed/recommended as applicable.   Snoring - Plan: Ambulatory referral to Sleep Studies Daytime somnolence - Plan: Ambulatory referral to Sleep Studies Class 1 obesity without serious comorbidity with body mass index (  BMI) of 34.0 to 34.9 in adult, unspecified obesity type - Plan: Ambulatory referral to Sleep Studies  - reported snoring, and some intermittent fatigue/daytime somnolence. Does not appear to be daily issue, but will refer to sleep specialist to determine if sleep apnea testing indicated.  -continue to work on exercise for not only weight loss but for depression/anger treatment.  Anger Other depression - Plan: sertraline (ZOLOFT) 100 MG tablet  -he reports this as being stable, but note provided from wife with some concerns for lability/irritability. Recommended he and his wife's schedule appointment with her previous therapist to discuss these symptoms further. Will continue same dose of Zoloft for now  -also asked that he follow-up after that meeting with therapist to discuss memory symptoms further. This may be more distractibility, then true memory disorder.   Fatigue, unspecified type - Plan: Comprehensive metabolic panel, CBC, TSH  -as above refer to sleep specialist, check CMP, CBC, TSH, follow-up in the next month to discuss further.  Hyperlipidemia, unspecified hyperlipidemia type - Plan: Comprehensive metabolic panel, Lipid panel, rosuvastatin (CRESTOR) 40 MG tablet  -tolerating Crestor, continue  same dose, labs pending Need for Tdap vaccination - Plan: Tdap vaccine greater than or equal to 7yo IM   Meds ordered this encounter  Medications  . sertraline (ZOLOFT) 100 MG tablet    Sig: 1 daily    Dispense:  90 tablet    Refill:  1  . rosuvastatin (CRESTOR) 40 MG tablet    Sig: TAKE 1 TABLET BY MOUTH DAILY    Dispense:  90 tablet    Refill:  1   Patient Instructions   I would recommend meeting with counselor/therapist to discuss some of the concerns on your paper today, but also will refer you to sleep specialist to check for sleep apnea. Please follow up to discuss possible memory concerns, and other information on your paper further in the next month  Return to the clinic or go to the nearest emergency room if any of your symptoms worsen or new symptoms occur.  Keep routine follow up with urology.   Low intensity exercise most days per week, including swimming as option most days per week.   Limit alcohol to no more than 2 drinks per day.   Keeping you healthy  Get these tests  Blood pressure- Have your blood pressure checked once a year by your healthcare provider.  Normal blood pressure is 120/80  Weight- Have your body mass index (BMI) calculated to screen for obesity.  BMI is a measure of body fat based on height and weight. You can also calculate your own BMI at ViewBanking.si.  Cholesterol- Have your cholesterol checked every year.  Diabetes- Have your blood sugar checked regularly if you have high blood pressure, high cholesterol, have a family history of diabetes or if you are overweight.  Screening for Colon Cancer- Colonoscopy starting at age 81.  Screening may begin sooner depending on your family history and other health conditions. Follow up colonoscopy as directed by your Gastroenterologist.  Screening for Prostate Cancer- Both blood work (PSA) and a rectal exam help screen for Prostate Cancer.  Screening begins at age 75 with African-American  men and at age 39 with Caucasian men.  Screening may begin sooner depending on your family history.  Take these medicines  Aspirin- One aspirin daily can help prevent Heart disease and Stroke.  Flu shot- Every fall.  Tetanus- Every 10 years.  Zostavax- Once after the age of 1 to prevent Shingles.  Pneumonia shot- Once after the age of 89; if you are younger than 28, ask your healthcare provider if you need a Pneumonia shot.  Take these steps  Don't smoke- If you do smoke, talk to your doctor about quitting.  For tips on how to quit, go to www.smokefree.gov or call 1-800-QUIT-NOW.  Be physically active- Exercise 5 days a week for at least 30 minutes.  If you are not already physically active start slow and gradually work up to 30 minutes of moderate physical activity.  Examples of moderate activity include walking briskly, mowing the yard, dancing, swimming, bicycling, etc.  Eat a healthy diet- Eat a variety of healthy food such as fruits, vegetables, low fat milk, low fat cheese, yogurt, lean meant, poultry, fish, beans, tofu, etc. For more information go to www.thenutritionsource.org  Drink alcohol in moderation- Limit alcohol intake to less than two drinks a day. Never drink and drive.  Dentist- Brush and floss twice daily; visit your dentist twice a year.  Depression- Your emotional health is as important as your physical health. If you're feeling down, or losing interest in things you would normally enjoy please talk to your healthcare provider.  Eye exam- Visit your eye doctor every year.  Safe sex- If you may be exposed to a sexually transmitted infection, use a condom.  Seat belts- Seat belts can save your life; always wear one.  Smoke/Carbon Monoxide detectors- These detectors need to be installed on the appropriate level of your home.  Replace batteries at least once a year.  Skin cancer- When out in the sun, cover up and use sunscreen 15 SPF or higher.  Violence- If  anyone is threatening you, please tell your healthcare provider.  Living Will/ Health care power of attorney- Speak with your healthcare provider and family.      IF you received an x-ray today, you will receive an invoice from [facility]. Please contact [facility] at [phone] with questions or concerns regarding your invoice.   IF you received labwork today, you will receive an invoice from [facility]. Please contact [facility] at [phone] with questions or concerns regarding your invoice.   Our billing staff will not be able to assist you with questions regarding bills from these companies.  You will be contacted with the lab results as soon as they are available. The fastest way to get your results is to activate your My Chart account. Instructions are located on the last page of this paperwork. If you have not heard from [place] regarding the results in 2 weeks, please contact this office.       I personally performed the services described in this documentation, which was scribed in my presence. The recorded information has been reviewed and considered for accuracy and completeness, addended by me as needed, and agree with information above.  Signed,   Merri Ray, MD Primary Care at [place].  12/22/16 1:02 PM

## 2016-12-22 LAB — LIPID PANEL
CHOL/HDL RATIO: 5.7 ratio — AB (ref 0.0–5.0)
Cholesterol, Total: 292 mg/dL — ABNORMAL HIGH (ref 100–199)
HDL: 51 mg/dL (ref >39)
LDL CALC: 217 mg/dL — AB (ref 0–99)
Triglycerides: 122 mg/dL (ref 0–149)
VLDL CHOLESTEROL CAL: 24 mg/dL (ref 5–40)

## 2016-12-22 LAB — COMPREHENSIVE METABOLIC PANEL
A/G RATIO: 1.7 (ref 1.2–2.2)
ALBUMIN: 4.7 g/dL (ref 3.5–5.5)
ALK PHOS: 113 IU/L (ref 39–117)
ALT: 13 IU/L (ref 0–44)
AST: 22 IU/L (ref 0–40)
BUN / CREAT RATIO: 11 (ref 9–20)
BUN: 10 mg/dL (ref 6–24)
Bilirubin Total: 0.5 mg/dL (ref 0.0–1.2)
CALCIUM: 9.6 mg/dL (ref 8.7–10.2)
CO2: 23 mmol/L (ref 20–29)
Chloride: 102 mmol/L (ref 96–106)
Creatinine, Ser: 0.89 mg/dL (ref 0.76–1.27)
GFR calc Af Amer: 110 mL/min/{1.73_m2} (ref >59)
GFR, EST NON AFRICAN AMERICAN: 95 mL/min/{1.73_m2} (ref >59)
GLOBULIN, TOTAL: 2.7 g/dL (ref 1.5–4.5)
Glucose: 98 mg/dL (ref 65–99)
Potassium: 5 mmol/L (ref 3.5–5.2)
Sodium: 141 mmol/L (ref 134–144)
Total Protein: 7.4 g/dL (ref 6.0–8.5)

## 2016-12-22 LAB — CBC
HEMATOCRIT: 43.2 % (ref 37.5–51.0)
Hemoglobin: 14.6 g/dL (ref 13.0–17.7)
MCH: 31.1 pg (ref 26.6–33.0)
MCHC: 33.8 g/dL (ref 31.5–35.7)
MCV: 92 fL (ref 79–97)
PLATELETS: 172 10*3/uL (ref 150–379)
RBC: 4.7 x10E6/uL (ref 4.14–5.80)
RDW: 14.5 % (ref 12.3–15.4)
WBC: 4.2 10*3/uL (ref 3.4–10.8)

## 2016-12-22 LAB — TSH: TSH: 2.31 u[IU]/mL (ref 0.450–4.500)

## 2017-01-11 ENCOUNTER — Encounter: Payer: Self-pay | Admitting: Neurology

## 2017-01-11 ENCOUNTER — Ambulatory Visit (INDEPENDENT_AMBULATORY_CARE_PROVIDER_SITE_OTHER): Payer: BLUE CROSS/BLUE SHIELD | Admitting: Neurology

## 2017-01-11 VITALS — BP 155/103 | HR 74 | Ht 75.0 in | Wt 282.0 lb

## 2017-01-11 DIAGNOSIS — Z86711 Personal history of pulmonary embolism: Secondary | ICD-10-CM

## 2017-01-11 DIAGNOSIS — G4752 REM sleep behavior disorder: Secondary | ICD-10-CM

## 2017-01-11 DIAGNOSIS — R351 Nocturia: Secondary | ICD-10-CM | POA: Diagnosis not present

## 2017-01-11 DIAGNOSIS — F515 Nightmare disorder: Secondary | ICD-10-CM | POA: Diagnosis not present

## 2017-01-11 DIAGNOSIS — R0683 Snoring: Secondary | ICD-10-CM | POA: Diagnosis not present

## 2017-01-11 NOTE — Progress Notes (Signed)
Subjective:    Patient ID: Scott [name] is a 56 y.o. male.  HPI     Star Age, MD, PhD [facility] [address] [place], [place] [address]  Dear Dr. Carlota Raspberry,   I saw your patient, Scott [name], upon your kind request in my neurologic clinic today for initial consultation of his sleep disorder, in particular, concern for underlying obstructive sleep apnea. The patient is unaccompanied today. As you know, Scott [name] is a 56 year old right-handed gentleman with an underlying medical history of hyperlipidemia, prostate cancer status post prostatectomy, mood disorder, vitamin D deficiency, arthritis with status post knee surgery, rotator cuff surgery, hip replacement, and status post bilateral cataract extractions, history of melanoma and obesity, who reports snoring and some sleep disruption, has had some dream enactments infrequently as well. Snoring disturbs his wife and sometimes she sleeps in a different bedroom. His father use to snore loudly but was never tested for sleep apnea. I reviewed your office note from 12/21/2016. He has 2 kids from his first marriage, daughter age 20 and son age 31. He is the Pharmacologist at [employer]. He has had dream enactments infrequently.  He has no morning headaches, denies RLS symptoms or leg twitching, has nocturia 2-3 times per night, no obvious FHx of OSA. He had L TKA in June 2018 under Dr. Mayer Camel. Has R knee pain, s/p R THR in 2012. He had a PE after his prostatectomy. He is now on ASA 81mg . his bedtime is typically around 10 and wake up time is around 5:45 AM. His Epworth sleepiness score is 4 out of 24 today, fatigue score is 25 out of 63.  His Past Medical History Is Significant For: Past Medical History:  Diagnosis Date  . Asperger's syndrome    symptoms of  . Herpes simplex type II infection   . Hyperlipidemia   . Malignant neoplasm of prostate ([place])   . Multiple lipomas   .  Premature ejaculation    07/2004  . Pulmonary embolism ([place])    04/2009  . Unspecified vitamin D deficiency     His Past Surgical History Is Significant For: Past Surgical History:  Procedure Laterality Date  . CATARACT EXTRACTION, BILATERAL    . hip repalcement     2012  . KNEE ARTHROSCOPY W/ MENISCAL REPAIR Left 12/2014  . robotic prostatectomy     05/21/2009  . SHOULDER OPEN ROTATOR CUFF REPAIR      His Family History Is Significant For: Family History  Problem Relation Age of Onset  . Heart disease Unknown   . Hyperlipidemia Unknown   . Diabetes Unknown        ?  . Diabetes Mother   . Hypertension Mother   . Rheum arthritis Mother   . Cancer Father   . Heart failure Father        "heart trouble"    His Social History Is Significant For: Social History   Social History  . Marital status: Married    Spouse name: N/A  . Number of children: N/A  . Years of education: N/A   Social History Main Topics  . Smoking status: Former Smoker    Packs/day: 1.00    Types: Cigarettes    Quit date: 03/29/2002  . Smokeless tobacco: Never Used  . Alcohol use 1.5 - 2.5 oz/week    3 - 5 Standard drinks or equivalent per week  . Drug use: No  . Sexual activity: Not  Asked   Other Topics Concern  . None   Social History Narrative  . None    His Allergies Are:  No Known Allergies:   His Current Medications Are:  Outpatient Encounter Prescriptions as of 01/11/2017  Medication Sig  . aspirin EC 81 MG tablet Take 81 mg by mouth daily.  . rosuvastatin (CRESTOR) 40 MG tablet TAKE 1 TABLET BY MOUTH DAILY  . sertraline (ZOLOFT) 100 MG tablet 1 daily  . tadalafil (CIALIS) 5 MG tablet Take 1 tablet (5 mg total) by mouth daily as needed.  . valACYclovir (VALTREX) 500 MG tablet Take 1 tablet (500 mg total) by mouth daily.   No facility-administered encounter medications on file as of 01/11/2017.   : Review of Systems:  Out of a complete 14 point review of systems, all are  reviewed and negative with the exception of these symptoms as listed below:  Review of Systems  Neurological:       Pt presents today to discuss his sleep. Pt does endorse snoring but has never had a sleep study.  Epworth Sleepiness Scale 0= would never doze 1= slight chance of dozing 2= moderate chance of dozing 3= high chance of dozing  Sitting and reading: 1 Watching TV: 1 Sitting inactive in a public place (ex. Theater or meeting): 0 As a passenger in a car for an hour without a break: 0 Lying down to rest in the afternoon: 2 Sitting and talking to someone: 0 Sitting quietly after lunch (no alcohol): 0 In a car, while stopped in traffic: 0 Total: 4     Objective:  Neurological Exam  Physical Exam Physical Examination:   Vitals:   01/11/17 1103  BP: (!) 155/103  Pulse: 74    General Examination: The patient is a very pleasant 56 y.o. male in no acute distress. He appears well-developed and well-nourished and well groomed.   HEENT: Normocephalic, atraumatic, pupils are equal, round and reactive to light and accommodation. Extraocular tracking is good without limitation to gaze excursion or nystagmus noted. Normal smooth pursuit is noted. Hearing is grossly intact. Face is symmetric with normal facial animation and normal facial sensation. Speech is clear with no dysarthria noted. There is no hypophonia. There is no lip, neck/head, jaw or voice tremor. Neck is supple with full range of passive and active motion. There are no carotid bruits on auscultation. Oropharynx exam reveals: mild mouth dryness, adequate dental hygiene and moderate airway crowding, due to smaller airway entry and larger uvula, tonsils of 1+, L>R. Mallampati is class II. Tongue protrudes centrally and palate elevates symmetrically. Neck size is 18.25 inches. He has a nearly absent overbite.    Chest: Clear to auscultation without wheezing, rhonchi or crackles noted.  Heart: S1+S2+0, regular and normal  without murmurs, rubs or gallops noted.   Abdomen: Soft, non-tender and non-distended with normal bowel sounds appreciated on auscultation.  Extremities: There is no pitting edema in the distal lower extremities bilaterally. Pedal pulses are intact.  Skin: Warm and dry without trophic changes noted.  Musculoskeletal: exam reveals no obvious joint deformities, tenderness or joint swelling or erythema. Mild R knee pain, mild L knee swelling.  Neurologically:  Mental status: The patient is awake, alert and oriented in all 4 spheres. His immediate and remote memory, attention, language skills and fund of knowledge are appropriate. There is no evidence of aphasia, agnosia, apraxia or anomia. Speech is clear with normal prosody and enunciation. Thought process is linear. Mood is normal and affect  is normal.  Cranial nerves II - XII are as described above under HEENT exam. In addition: shoulder shrug is normal with equal shoulder height noted. Motor exam: Normal bulk, strength and tone is noted. There is no drift, tremor or rebound. Romberg is negative. Reflexes are 1+ throughout. Fine motor skills and coordination: intact with normal finger taps, normal hand movements, normal rapid alternating patting, normal foot taps and normal foot agility.  Cerebellar testing: No dysmetria or intention tremor on finger to nose testing. Heel to shin is unremarkable bilaterally. There is no truncal or gait ataxia.  Sensory exam: intact to light touch in the upper and lower extremities.  Gait, station and balance: He stands easily. No veering to one side is noted. No leaning to one side is noted. Posture is age-appropriate and stance is narrow based. Gait shows normal stride length and normal pace. No problems turning are noted. Tandem walk is unremarkable.   Assessment and Plan:  In summary, Scott [name] is a very pleasant 56 y.o.-year old male with an underlying medical history of hyperlipidemia, prostate  cancer status post prostatectomy, mood disorder, vitamin D deficiency, arthritis with status post knee surgery, rotator cuff surgery, hip replacement, and status post bilateral cataract extractions, history of melanoma and obesity, whose history and physical exam are concerning for obstructive sleep apnea (OSA). I had a long chat with the patient about my findings and the diagnosis of OSA, its prognosis and treatment options. We talked about medical treatments, surgical interventions and non-pharmacological approaches. I explained in particular the risks and ramifications of untreated moderate to severe OSA, especially with respect to developing cardiovascular disease down the Road, including congestive heart failure, difficult to treat hypertension, cardiac arrhythmias, or stroke. Even type 2 diabetes has, in part, been linked to untreated OSA. Symptoms of untreated OSA include daytime sleepiness, memory problems, mood irritability and mood disorder such as depression and anxiety, lack of energy, as well as recurrent headaches, especially morning headaches. We talked about trying to maintain a healthy lifestyle in general, as well as the importance of weight control. I encouraged the patient to eat healthy, exercise daily and keep well hydrated, to keep a scheduled bedtime and wake time routine, to not skip any meals and eat healthy snacks in between meals. I advised the patient not to drive when feeling sleepy. I recommended the following at this time: sleep study with potential positive airway pressure titration. (We will score hypopneas at 3%).   I explained the sleep test procedure to the patient and also outlined possible surgical and non-surgical treatment options of OSA, including the use of a custom-made dental device (which would require a referral to a specialist dentist or oral surgeon), upper airway surgical options, such as pillar implants, radiofrequency surgery, tongue base surgery, and UPPP  (which would involve a referral to an ENT surgeon). Rarely, jaw surgery such as mandibular advancement may be considered.  I also explained the CPAP treatment option to the patient, who indicated that he would be willing to try CPAP if the need arises. I explained the importance of being compliant with PAP treatment, not only for insurance purposes but primarily to improve His symptoms, and for the patient's long term health benefit, including to reduce His cardiovascular risks. I answered all his questions today and the patient was in agreement. I would like to see him back after the sleep study is completed and encouraged him to call with any interim questions, concerns, problems or updates.  Thank you very much for allowing me to participate in the care of this nice patient. If I can be of any further assistance to you please do not hesitate to call me at [phone].  Sincerely,   Star Age, MD, PhD

## 2017-01-11 NOTE — Patient Instructions (Signed)

## 2017-01-18 DIAGNOSIS — L821 Other seborrheic keratosis: Secondary | ICD-10-CM | POA: Diagnosis not present

## 2017-01-18 DIAGNOSIS — L814 Other melanin hyperpigmentation: Secondary | ICD-10-CM | POA: Diagnosis not present

## 2017-01-18 DIAGNOSIS — D225 Melanocytic nevi of trunk: Secondary | ICD-10-CM | POA: Diagnosis not present

## 2017-01-18 DIAGNOSIS — L918 Other hypertrophic disorders of the skin: Secondary | ICD-10-CM | POA: Diagnosis not present

## 2017-01-25 ENCOUNTER — Ambulatory Visit (INDEPENDENT_AMBULATORY_CARE_PROVIDER_SITE_OTHER): Payer: BLUE CROSS/BLUE SHIELD | Admitting: Family Medicine

## 2017-01-25 ENCOUNTER — Encounter: Payer: Self-pay | Admitting: Family Medicine

## 2017-01-25 VITALS — BP 122/80 | HR 84 | Temp 98.9°F | Resp 16 | Ht 75.2 in | Wt 279.0 lb

## 2017-01-25 DIAGNOSIS — F329 Major depressive disorder, single episode, unspecified: Secondary | ICD-10-CM

## 2017-01-25 DIAGNOSIS — R454 Irritability and anger: Secondary | ICD-10-CM | POA: Diagnosis not present

## 2017-01-25 DIAGNOSIS — F32A Depression, unspecified: Secondary | ICD-10-CM

## 2017-01-25 DIAGNOSIS — E785 Hyperlipidemia, unspecified: Secondary | ICD-10-CM

## 2017-01-25 NOTE — Progress Notes (Signed)
Subjective:  By signing my name below, I, Scott [name], attest that this documentation has been prepared under the direction and in the presence of [name], MD Electronically Signed: Ladene Artist, ED Scribe 01/25/2017 at 9:14 AM.   Patient ID: Scott [name], male    DOB: November 01, 1960, 56 y.o.   MRN: [id number]  Chief Complaint  Patient presents with  . Memory Loss    and Mood concerns. Follow-up from 9/25   HPI Scott [name] is a 56 y.o. male who presents to Primary Care at [facility] for follow-up. Pt was seen on 9/25 for CPE. Was taking Zoloft 100 mg for depression/anger symptoms. There were multiple concerns from home provided on paperwork last visit including forgetfulness, mood changes, snoring, irritability. Continued same dose of Zoloft but recommended that him and his wife schedule therapist appointments. Thought his memory issue was more distractibility than true memory. Recommended exercise most days/week including possible swimming.   Today, pt presents with another note from home written by his wife that states that she did not feel that this was a relationship issue but a medical issue. States she created a list since she did not feel that he would relay or address her concern's due to his symptoms of Asperger's syndrome. Wife also reports in the letter that pt had increased alcohol consumption. Pt reports that he was having 2 alcoholic beverages/day at last visit but has cut back to 1 drink/day. Pt also reports that he is making an effort to go to bed earlier now. Also admits that he was not taking Zoloft as prescribed when he was last seen; was only taking Zoloft twice/week but has been compliant since without side effects. Has not returned to exercising due to knee issues. They did not feel the need to meet with a counselor. Reports that his memory has improved overall since last visit.   Snoring Daytime somnolence. Referred to sleep specialist. Appointment on  11/16 with plan for split study.   Hyperlipidemia Lab Results  Component Value Date   CHOL 292 (H) 12/21/2016   HDL 51 12/21/2016   LDLCALC 217 (H) 12/21/2016   TRIG 122 12/21/2016   CHOLHDL 5.7 (H) 12/21/2016   Lab Results  Component Value Date   ALT 13 12/21/2016   AST 22 12/21/2016   ALKPHOS 113 12/21/2016   BILITOT 0.5 12/21/2016  Also admits to only taking Crestor 3 days/week but has been compliant since last visit. Denies myalgias, cp, sob, light-headedness, dizziness.  L Knee Pt reports cracking in his L knee with standing. H/o L knee surgery by Dr. Mayer Camel. Denies knee pain, swelling, giving away.   Patient Active Problem List   Diagnosis Date Noted  . Obesity 07/10/2015  . Anger 12/16/2013  . Hyperlipidemia   . Unspecified vitamin D deficiency   . Herpes simplex type II infection    Past Medical History:  Diagnosis Date  . Asperger's syndrome    symptoms of  . Herpes simplex type II infection   . Hyperlipidemia   . Malignant neoplasm of prostate ([place])   . Multiple lipomas   . Premature ejaculation    07/2004  . Pulmonary embolism ([place])    04/2009  . Unspecified vitamin D deficiency    Past Surgical History:  Procedure Laterality Date  . CATARACT EXTRACTION, BILATERAL    . hip repalcement     2012  . KNEE ARTHROSCOPY W/ MENISCAL REPAIR Left 12/2014  . robotic prostatectomy     05/21/2009  .  SHOULDER OPEN ROTATOR CUFF REPAIR     No Known Allergies Prior to Admission medications   Medication Sig Start Date End Date Taking? Authorizing Provider  aspirin EC 81 MG tablet Take 81 mg by mouth daily.    [provider]  rosuvastatin (CRESTOR) 40 MG tablet TAKE 1 TABLET BY MOUTH DAILY 12/21/16   [name], MD  sertraline (ZOLOFT) 100 MG tablet 1 daily 12/21/16   [name], MD  tadalafil (CIALIS) 5 MG tablet Take 1 tablet (5 mg total) by mouth daily as needed. 03/05/14   Jaynee Eagles, PA-C  valACYclovir (VALTREX) 500 MG tablet Take 1  tablet (500 mg total) by mouth daily. 06/17/16   [name], MD   Social History   Social History  . Marital status: Married    Spouse name: N/A  . Number of children: N/A  . Years of education: N/A   Occupational History  . Not on file.   Social History Main Topics  . Smoking status: Former Smoker    Packs/day: 1.00    Types: Cigarettes    Quit date: 03/29/2002  . Smokeless tobacco: Never Used  . Alcohol use 1.5 - 2.5 oz/week    3 - 5 Standard drinks or equivalent per week  . Drug use: No  . Sexual activity: Not on file   Other Topics Concern  . Not on file   Social History Narrative  . No narrative on file   Review of Systems  Respiratory: Negative for shortness of breath.   Cardiovascular: Negative for chest pain.  Musculoskeletal: Negative for arthralgias, joint swelling and myalgias.  Neurological: Negative for dizziness and light-headedness.  Psychiatric/Behavioral: Negative for sleep disturbance.      Objective:   Physical Exam  Constitutional: He is oriented to person, place, and time. He appears well-developed and well-nourished.  HENT:  Head: Normocephalic and atraumatic.  Eyes: Pupils are equal, round, and reactive to light. EOM are normal.  Neck: No JVD present. Carotid bruit is not present.  Cardiovascular: Normal rate, regular rhythm and normal heart sounds.   No murmur heard. Pulmonary/Chest: Effort normal and breath sounds normal. He has no rales.  Musculoskeletal: He exhibits no edema.  Neurological: He is alert and oriented to person, place, and time.  Skin: Skin is warm and dry.  Psychiatric: He has a normal mood and affect.  Vitals reviewed.  Vitals:   01/25/17 0858  BP: 122/80  Pulse: 84  Resp: 16  Temp: 98.9 F (37.2 C)  TempSrc: Oral  SpO2: 95%  Weight: 279 lb (126.6 kg)  Height: 6' 3.2" (1.91 m)      Assessment & Plan:  CHRISHAWN [name] is a 56 y.o. male Depression, unspecified depression type Irritability  -  Improved now that he has been compliant with Zoloft, increasing sleep and decrease alcohol. Discussed restarting exercise, low intensity such as pool exercise may also be helpful.  - Continue planned follow-up for sleep study  -Recheck 3 months, sooner if worsening symptoms  Hyperlipidemia, unspecified hyperlipidemia type  - Elevated last visit as not taking Crestor routinely. Now back on Crestor once per day, tolerating without any side effects. Recheck levels in 3 months.  No orders of the defined types were placed in this encounter.  Patient Instructions    Thanks for coming in today. I'm glad to hear that you are doing better. Make sure you continue to take Crestor and Zoloft every day.  I would recommend continuing to avoid  more than 1 alcoholic drink per day, continue to get sufficient sleep and the upcoming sleep study should also determine if sleep apnea may be an issue. Starting back exercise, such as pool exercise or low intensity exercise can also help with mood.  Call your orthopedist to discuss the new noises from your knee to determine if follow-up needed sooner than planned.   Recheck with me in 3 months for repeat cholesterol test now that you are back on statin medication everyday, and can discuss mood further at that time. Follow-up sooner if worse.     IF you received an x-ray today, you will receive an invoice from [facility]. Please contact [facility] at [phone] with questions or concerns regarding your invoice.   IF you received labwork today, you will receive an invoice from [place]. Please contact [facility] at [phone] with questions or concerns regarding your invoice.   Our billing staff will not be able to assist you with questions regarding bills from these companies.  You will be contacted with the lab results as soon as they are available. The fastest way to get your results is to activate your My Chart account. Instructions are  located on the last page of this paperwork. If you have not heard from [place] regarding the results in 2 weeks, please contact this office.       I personally performed the services described in this documentation, which was scribed in my presence. The recorded information has been reviewed and considered for accuracy and completeness, addended by me as needed, and agree with information above.  Signed,   Merri Ray, MD [facility] at [facility].  01/25/17 9:30 AM

## 2017-01-25 NOTE — Patient Instructions (Addendum)
  Thanks for coming in today. I'm glad to hear that you are doing better. Make sure you continue to take Crestor and Zoloft every day.  I would recommend continuing to avoid more than 1 alcoholic drink per day, continue to get sufficient sleep and the upcoming sleep study should also determine if sleep apnea may be an issue. Starting back exercise, such as pool exercise or low intensity exercise can also help with mood.  Call your orthopedist to discuss the new noises from your knee to determine if follow-up needed sooner than planned.   Recheck with me in 3 months for repeat cholesterol test now that you are back on statin medication everyday, and can discuss mood further at that time. Follow-up sooner if worse.     IF you received an x-ray today, you will receive an invoice from [facility]. Please contact [facility] at [phone] with questions or concerns regarding your invoice.   IF you received labwork today, you will receive an invoice from [place]. Please contact [facility] at [phone] with questions or concerns regarding your invoice.   Our billing staff will not be able to assist you with questions regarding bills from these companies.  You will be contacted with the lab results as soon as they are available. The fastest way to get your results is to activate your My Chart account. Instructions are located on the last page of this paperwork. If you have not heard from [place] regarding the results in 2 weeks, please contact this office.

## 2017-02-11 ENCOUNTER — Ambulatory Visit (INDEPENDENT_AMBULATORY_CARE_PROVIDER_SITE_OTHER): Payer: BLUE CROSS/BLUE SHIELD | Admitting: Neurology

## 2017-02-11 DIAGNOSIS — G4752 REM sleep behavior disorder: Secondary | ICD-10-CM

## 2017-02-11 DIAGNOSIS — R0683 Snoring: Secondary | ICD-10-CM | POA: Diagnosis not present

## 2017-02-11 DIAGNOSIS — G4733 Obstructive sleep apnea (adult) (pediatric): Secondary | ICD-10-CM

## 2017-02-11 DIAGNOSIS — R351 Nocturia: Secondary | ICD-10-CM

## 2017-02-11 DIAGNOSIS — F515 Nightmare disorder: Secondary | ICD-10-CM

## 2017-02-11 DIAGNOSIS — Z86711 Personal history of pulmonary embolism: Secondary | ICD-10-CM

## 2017-02-11 DIAGNOSIS — G4720 Circadian rhythm sleep disorder, unspecified type: Secondary | ICD-10-CM

## 2017-02-16 ENCOUNTER — Other Ambulatory Visit: Payer: Self-pay | Admitting: Neurology

## 2017-02-16 ENCOUNTER — Telehealth: Payer: Self-pay

## 2017-02-16 DIAGNOSIS — G4733 Obstructive sleep apnea (adult) (pediatric): Secondary | ICD-10-CM

## 2017-02-16 NOTE — Procedures (Signed)
PATIENT'S NAME:  Scott [name], Scott [name] DOB:      [birth year]      MR#:    [id number]     DATE OF RECORDING: 02/11/2017 REFERRING M.D.:  Merri Ray MD Study Performed:  Split-Night Titration Study HISTORY: 56 year old man with a history of hyperlipidemia, prostate cancer status post prostatectomy, mood disorder, vitamin D deficiency, arthritis with status post knee surgery, rotator cuff surgery, hip replacement, and status post bilateral cataract extractions, history of melanoma and obesity, who reports snoring, sleep disruption and dream enactments infrequently. The patient endorsed the Epworth Sleepiness Scale at 4 points.  The patient's weight 282 pounds with a height of 75 (inches), resulting in a BMI of 35.1 kg/m2. The patient's neck circumference measured 18.25 inches.  CURRENT MEDICATIONS: Aspirin, Crestor, Zoloft, Cialis, Valtrex  PROCEDURE:  This is a multichannel digital polysomnogram utilizing the Somnostar 11.2 system.  Electrodes and sensors were applied and monitored per AASM Specifications.   EEG, EOG, Chin and Limb EMG, were sampled at 200 Hz.  ECG, Snore and Nasal Pressure, Thermal Airflow, Respiratory Effort, CPAP Flow and Pressure, Oximetry was sampled at 50 Hz. Digital video and audio were recorded.      BASELINE STUDY WITHOUT CPAP RESULTS:  Lights Out was at 22:44 and Lights On at 05:01, split study start at 00:57, epoch 274. Total recording time (TRT) was 132.5, with a total sleep time (TST) of 84.5 minutes.   The patient's sleep latency was 38 minutes.  REM sleep was absent. The sleep efficiency was 63.8 %.    SLEEP ARCHITECTURE: WASO (Wake after sleep onset) was 4.5 minutes, Stage N1 was 37.5 minutes, Stage N2 was 39 minutes, Stage N3 was 8 minutes and Stage R (REM sleep) was 0 minutes.  The percentages were Stage N1 44.4%, Stage N2 46.2%, Stage N3 9.5% and Stage R (REM sleep) was absent. The arousals were noted as: 13 were spontaneous, 0 were associated with PLMs, 26 were  associated with respiratory events.  Audio and video analysis did not show any abnormal or unusual movements, behaviors, phonations or vocalizations. The patient took 1 bathroom break. Loud snoring was noted. The EKG was in keeping with normal sinus rhythm (NSR).   RESPIRATORY ANALYSIS:  There were a total of 110 respiratory events:  27 obstructive apneas, 0 central apneas and 0 mixed apneas with a total of 27 apneas and an apnea index (AI) of 19.2. There were 83 hypopneas with a hypopnea index of 58.9. The patient also had 0 respiratory event related arousals (RERAs).  Snoring was noted.     The total APNEA/HYPOPNEA INDEX (AHI) was 78.1 /hour and the total RESPIRATORY DISTURBANCE INDEX was 78.1 /hour.  0 events occurred in REM sleep and 166 events in NREM. The REM AHI was 0, /hour versus a non-REM AHI of 78.1 /hour. The patient spent 204.5 minutes sleep time in the supine position 86 minutes in non-supine. The supine AHI was 76.4 /hour versus a non-supine AHI of 80.6 /hour.  OXYGEN SATURATION & C02:  The wake baseline 02 saturation was 95%, with the lowest being 79%. Time spent below 89% saturation equaled 69 minutes.  PERIODIC LIMB MOVEMENTS: The patient had a total of 0 Periodic Limb Movements.  The Periodic Limb Movement (PLM) index was 0 /hour and the PLM Arousal index was 0 /hour.  TITRATION STUDY WITH CPAP RESULTS:   The patient was fitted with a medium Eson nasal mask. CPAP was initiated at 5 cmH20 with heated humidity per AASM split night standards  and pressure was advanced to 8 cmH20 because of hypopneas, apneas and desaturations.  At a PAP pressure of 8 cmH20, there was a reduction of the AHI to .3/hour, O2 nadir of 85% with supine REM sleep achieved.   Total recording time (TRT) was 245 minutes, with a total sleep time (TST) of 206 minutes. The patient's sleep latency was 24.5 minutes. REM latency was 8.5 minutes.  The sleep efficiency was 84.1 %.    SLEEP ARCHITECTURE: Wake after  sleep was 21.5 minutes, Stage N1 23.5 minutes, Stage N2 60 minutes, Stage N3 35 minutes and Stage R (REM sleep) 87.5 minutes. The percentages were: Stage N1 11.4%, Stage N2 29.1%, Stage N3 17.% and Stage R (REM sleep) 42.5%, which is increased and in keeping with rebound.  No dream enactments or abnormal EMG activity were noted during REM sleep. The arousals were noted as: 28 were spontaneous, 0 were associated with PLMs, 1 were associated with respiratory events.  RESPIRATORY ANALYSIS:  There were a total of 1 respiratory events: 0 obstructive apneas, 1 central apneas and 0 mixed apneas with a total of 1 apneas and an apnea index (AI) of .3. There were 0 hypopneas with a hypopnea index of 0 /hour. The patient also had 0 respiratory event related arousals (RERAs).      The total APNEA/HYPOPNEA INDEX  (AHI) was .3 /hour and the total RESPIRATORY DISTURBANCE INDEX was .3 /hour.  0 events occurred in REM sleep and 1 events in NREM. The REM AHI was 0 /hour versus a non-REM AHI of .5 /hour. The supine AHI was 0.4 /hour, versus a non-supine AHI of 0.0/hour.  OXYGEN SATURATION & C02:  The wake baseline 02 saturation was 94%, with the lowest being 84%. Time spent below 89% saturation equaled 33 minutes.  PERIODIC LIMB MOVEMENTS:    The patient had a total of 0 Periodic Limb Movements. The Periodic Limb Movement (PLM) index was 0 /hour and the PLM Arousal index was 0 /hour.  Post-study, the patient indicated that sleep was worse than usual.  POLYSOMNOGRAPHY IMPRESSION :   1. Obstructive Sleep Apnea (OSA)  2. Dysfunctions associated with sleep stages or arousals from sleep  RECOMMENDATIONS:  1. This patient has severe obstructive sleep apnea and responded well on CPAP therapy. Due to the O2 nadir of 85% on the final titration pressure of 8 cm, I will recommend home CPAP treatment at a pressure of 9 cm via medium nasal mask with heated humidity. The patient should be reminded to be fully compliant with PAP  therapy to improve sleep related symptoms and decrease long term cardiovascular risks. Please note that untreated obstructive sleep apnea carries additional perioperative morbidity. Patients with significant obstructive sleep apnea should receive perioperative PAP therapy and the surgeons and particularly the anesthesiologist should be informed of the diagnosis and the severity of the sleep disordered breathing. 2. This study shows sleep fragmentation and abnormal sleep stage percentages; these are nonspecific findings and per se do not signify an intrinsic sleep disorder or a cause for the patient's sleep-related symptoms. Causes include (but are not limited to) the first night effect of the sleep study, circadian rhythm disturbances, medication effect or an underlying mood disorder or medical problem.  3. The patient should be cautioned not to drive, work at heights, or operate dangerous or heavy equipment when tired or sleepy. Review and reiteration of good sleep hygiene measures should be pursued with any patient. 4. The patient will be seen in follow-up by Dr.  Shamekia Tippets at [facility] for discussion of the test results and further management strategies. The referring provider will be notified of the test results.  I certify that I have reviewed the entire raw data recording prior to the issuance of this report in accordance with the Standards of Accreditation of the American Academy of Sleep Medicine (AASM)   Star Age, MD, PhD Diplomat, American Board of Psychiatry and Neurology (Neurology and Sleep Medicine)

## 2017-02-16 NOTE — Progress Notes (Signed)
Patient referred by Dr. Carlota Raspberry, seen by me on 01/11/17, split night sleep study on 02/11/17. Please call and notify patient that the recent sleep study confirmed the diagnosis of severe OSA. He did well with CPAP during the study with significant improvement of the respiratory events. Therefore, I would like start the patient on CPAP therapy at home by prescribing a machine for home use. I placed the order in the chart.  Please advise patient that we need a follow up appointment with either myself or one of our nurse practitioners in about 10 weeks post set-up to check for how the patient is feeling and how well the patient is using the machine, etc. Please go ahead and schedule the appointment, while you have the patient on the phone and make sure patient understands the importance of keeping this window for the FU appointment, as it is often an insurance requirement. Failing to adhere to this may result in losing coverage for sleep apnea treatment, at which point most patients are left with a choice of returning the machine or paying out of pocket (and we want neither of this to happen!).  Please re-enforce the importance of compliance with treatment and the need for [place] to monitor compliance data - again an insurance requirement and usually a good feedback for the patient as far as how they are doing.  Also remind patient, that any PAP machine or mask issues should be first addressed with the DME company, who provided the machine/mask.  Please ask if patient has a preference regarding DME company, may depend on the insurance too.  Please arrange for CPAP set up at home through a DME company of patient's choice.  Once you have spoken to the patient you can close the phone encounter. Please fax/route report to referring provider, thanks,   Star Age, MD, PhD [facility])

## 2017-02-16 NOTE — Telephone Encounter (Signed)
I called pt. I advised pt that Dr. Rexene Alberts reviewed their sleep study results and found that pt has severe osa but did well with the cpap during the sleep study. Dr. Rexene Alberts recommends that pt start a cpap at home. I reviewed PAP compliance expectations with the pt. Pt is agreeable to starting a CPAP. I advised pt that an order will be sent to a DME, [facility], and [facility] will call the pt within about one week after they file with the pt's insurance. [facility] will show the pt how to use the machine, fit for masks, and troubleshoot the CPAP if needed. A follow up appt was made for insurance purposes with Dr. Rexene Alberts on 05/04/17 at 8:30am. Pt verbalized understanding to arrive 15 minutes early and bring their CPAP. A letter with all of this information in it will be mailed to the pt as a reminder. I verified with the pt that the address we have on file is correct. Pt verbalized understanding of results. Pt had no questions at this time but was encouraged to call back if questions arise.

## 2017-02-16 NOTE — Telephone Encounter (Signed)
-----   Message from Star Age, MD sent at 02/16/2017  7:54 AM EST ----- Patient referred by Dr. Carlota Raspberry, seen by me on 01/11/17, split night sleep study on 02/11/17. Please call and notify patient that the recent sleep study confirmed the diagnosis of severe OSA. He did well with CPAP during the study with significant improvement of the respiratory events. Therefore, I would like start the patient on CPAP therapy at home by prescribing a machine for home use. I placed the order in the chart.  Please advise patient that we need a follow up appointment with either myself or one of our nurse practitioners in about 10 weeks post set-up to check for how the patient is feeling and how well the patient is using the machine, etc. Please go ahead and schedule the appointment, while you have the patient on the phone and make sure patient understands the importance of keeping this window for the FU appointment, as it is often an insurance requirement. Failing to adhere to this may result in losing coverage for sleep apnea treatment, at which point most patients are left with a choice of returning the machine or paying out of pocket (and we want neither of this to happen!).  Please re-enforce the importance of compliance with treatment and the need for [place] to monitor compliance data - again an insurance requirement and usually a good feedback for the patient as far as how they are doing.  Also remind patient, that any PAP machine or mask issues should be first addressed with the DME company, who provided the machine/mask.  Please ask if patient has a preference regarding DME company, may depend on the insurance too.  Please arrange for CPAP set up at home through a DME company of patient's choice.  Once you have spoken to the patient you can close the phone encounter. Please fax/route report to referring provider, thanks,   Star Age, MD, PhD [facility])

## 2017-02-22 DIAGNOSIS — G4733 Obstructive sleep apnea (adult) (pediatric): Secondary | ICD-10-CM | POA: Diagnosis not present

## 2017-03-10 DIAGNOSIS — Z96652 Presence of left artificial knee joint: Secondary | ICD-10-CM | POA: Diagnosis not present

## 2017-03-10 DIAGNOSIS — M25562 Pain in left knee: Secondary | ICD-10-CM | POA: Diagnosis not present

## 2017-03-11 ENCOUNTER — Encounter: Payer: Self-pay | Admitting: Family Medicine

## 2017-03-11 ENCOUNTER — Ambulatory Visit: Payer: Self-pay

## 2017-03-11 ENCOUNTER — Ambulatory Visit (INDEPENDENT_AMBULATORY_CARE_PROVIDER_SITE_OTHER): Payer: BLUE CROSS/BLUE SHIELD | Admitting: Family Medicine

## 2017-03-11 ENCOUNTER — Other Ambulatory Visit: Payer: Self-pay

## 2017-03-11 ENCOUNTER — Telehealth: Payer: Self-pay | Admitting: Neurology

## 2017-03-11 VITALS — BP 132/84 | Temp 98.8°F | Resp 18 | Ht 75.87 in | Wt 285.6 lb

## 2017-03-11 DIAGNOSIS — R454 Irritability and anger: Secondary | ICD-10-CM

## 2017-03-11 DIAGNOSIS — Z9989 Dependence on other enabling machines and devices: Secondary | ICD-10-CM

## 2017-03-11 DIAGNOSIS — F845 Asperger's syndrome: Secondary | ICD-10-CM | POA: Diagnosis not present

## 2017-03-11 DIAGNOSIS — R413 Other amnesia: Secondary | ICD-10-CM

## 2017-03-11 DIAGNOSIS — G4733 Obstructive sleep apnea (adult) (pediatric): Secondary | ICD-10-CM | POA: Diagnosis not present

## 2017-03-11 NOTE — Telephone Encounter (Signed)
Pt's wife is calling to advise prior to starting CPAP he had short term memory loss, his eyes will "roll up toward the ceiling looking left" since mid November, this has happened on several occassions. Since starting the CPAP when in a deep sleep it is hard to get him awake and then he is confused upon waking. The 4th day on the CPAP, he cooks dinner, then he didn't want to eat and when she looked at him his eyes were darting back and forth very quickly and were dilated. He's had slurred speech on 2 occassions and denied use of alcohol. Please call to advise

## 2017-03-11 NOTE — Telephone Encounter (Signed)
Please call and advise wife, that I am not sure how to explain these different issues she is describing. Sleep study showed severe sleep apnea and I believe will help him to use CPAP regularly and long-term. If she has any interim concerns regarding behavior or eye movements, it is probably best to have him checked out by PCP, if there are any acute or serious issues, may have to take him to the emergency room.

## 2017-03-11 NOTE — Patient Instructions (Signed)
     IF you received an x-ray today, you will receive an invoice from [facility]. Please contact [facility] at [phone] with questions or concerns regarding your invoice.   IF you received labwork today, you will receive an invoice from [facility]. Please contact [facility] at [phone] with questions or concerns regarding your invoice.   Our billing staff will not be able to assist you with questions regarding bills from these companies.  You will be contacted with the lab results as soon as they are available. The fastest way to get your results is to activate your My Chart account. Instructions are located on the last page of this paperwork. If you have not heard from us regarding the results in 2 weeks, please contact this office.     

## 2017-03-11 NOTE — Telephone Encounter (Signed)
I called pt's wife, [name], per DPR. I advised her that Dr. Rexene Alberts is not sure how to explain these different issues that she is describing. Pt's sleep study did show severe sleep apnea and Dr. Rexene Alberts believes that using his cpap regularly will benefit long term. If pt or wife have concerns in the interim about behavior or eye movements, Dr. Rexene Alberts recommends a visit with pt's PCP to discuss, or if the issues seem to be acute or serious, she recommends an ER visit. Pt's wife verbalized understanding of these recommendations.

## 2017-03-11 NOTE — Telephone Encounter (Signed)
Phone call from pt's wife with multiple concerns about progressive changes in husband over past 6-8 weeks.  Reported the pt. was recently diagnosed with severe sleep apnea and has been on CPAP about 1.5 weeks.  Wife reported many different symptoms she has observed including: continued short term memory loss, 2 isolated episodes of slightly slurred speech, intervals of confusion, deep sleep with difficulty to arouse, rolling eyes to the left with fluttering of eyelids, episode of eyes shifting back and forth, eyes dilated, mood changes, irrational behavior at times. Examples given of abnormal behavior; recent episode of waking her up at 1:30 AM, after having showered, and thinking it was time to get up and go to work.  Stated when she discusses concerns with him about changes she has noticed, he minimizes her concerns.  Wife stated she hasn't been able to go to his appts. with him, but is off work today, and requested an appt. to be able to accompany him for further evaluation. Appt. given for 2:00 PM with Dr. Pamella Pert; pt. has been followed by Dr. Carlota Raspberry, but he is unavailable.  Agreed with plan.                Answer Assessment - Initial Assessment Questions 1. SYMPTOM: "What is the main symptom you are concerned about?" (e.g., weakness, numbness)     Short term memory loss continued, intermittent slurred speech x 2 episodes, eyes dilated, very confused with awakening, change in eye movements, mood changes  2. ONSET: "When did this start?" (minutes, hours, days; while sleeping)     Has noted pt's short term memory loss for a long time; became very obvious about 6 weeks ago. 3. LAST NORMAL: "When was the last time you were normal (no symptoms)?"     About 4-5 mos. ago 4. PATTERN "Does this come and go, or has it been constant since it started?"  "Is it present now?"     Comes and goes 5. CARDIAC SYMPTOMS: "Have you had any of the following symptoms: chest pain, difficulty breathing, palpitations?"  no 6. NEUROLOGIC SYMPTOMS: "Have you had any of the following symptoms: headache, dizziness, vision loss, double vision, changes in speech, unsteady on your feet?"     Wife noticed slurred speech on 2 occasions ; some disorientation; sometimes irrational ; right hand slight tremor on occasion  7. OTHER SYMPTOMS: "Do you have any other symptoms?"     Rolling of eyes to the left with fluttering of lashes; episode of eyes shifting back and forth for intervals recently 8. PREGNANCY: "Is there any chance you are pregnant?" "When was your last menstrual period?"    n/a  Protocols used: NEUROLOGIC DEFICIT-A-AH

## 2017-03-11 NOTE — Progress Notes (Signed)
12/14/20182:23 PM  Scott [name] [birth year], 56 y.o. male [id number]  Chief Complaint  Patient presents with  . Memory Loss    X 2 mth- short term memory loss   Patient Care Team: [name], MD as PCP - General (Family Medicine) Carol Ada, MD (Gastroenterology) Frederik Pear, MD (Orthopedic Surgery) Raynelle Bring, MD (Urology)  HPI:   Patient is a 56 y.o. male with past medical history significant for OSA recently started on CPAP, long standing anger/mood issues, asperger's per previous therapist, problems with short term memory who presents today at wife's request for concerns of several episodes that have happened over the past couple of weeks. Patient think that wife is over reacting and is not concerned, he feels fine.  Patient's wife describes 3 similar episodes all around her waking him up from an evening nap from his recliner. She describes him forgetting conversations had she she woke him up and then he went back to sleep, or him being in a deep sleep and being difficult to wake up, has had mildly slurred speech twice, also abnormal eye movements.   He reports nightly compliance with cpap. He reports he drinks one drink a night - equal to 2 shots of liquor and ice.  Has been on zoloft for years, his wife states it was started for premature ejaculation, and then helped some with anger/irritability. In the past prescribed seroquel, seen in the ER after taking 300mg  instead of 100mg , no intention of self-harm. He reports that he has always had issues with short-term memory, he cant remember names, at work when going to another department if asked to run an errand at last minute he will forget, he always asks people to send emails reiterating the conversation or request as he knows he will forget.   Depression screen [facility] 2/9 03/11/2017 03/11/2017 01/25/2017  Decreased Interest 0 0 0  Down, Depressed, Hopeless 0 0 0  PHQ - 2 Score 0 0 0  Altered sleeping 0 - -    Tired, decreased energy 1 - -  Change in appetite 1 - -  Feeling bad or failure about yourself  0 - -  Trouble concentrating 0 - -  Moving slowly or fidgety/restless 0 - -  Suicidal thoughts 0 - -  PHQ-9 Score 2 - -  Difficult doing work/chores Somewhat difficult - -   GAD 7 : Generalized Anxiety Score 03/11/2017  Nervous, Anxious, on Edge 0  Control/stop worrying 0  Worry too much - different things 0  Trouble relaxing 0  Restless 0  Easily annoyed or irritable 1  Afraid - awful might happen 0  Total GAD 7 Score 1  Anxiety Difficulty Not difficult at all    No Known Allergies  Prior to Admission medications   Medication Sig Start Date End Date Taking? Authorizing Provider  aspirin EC 81 MG tablet Take 81 mg by mouth daily.    [provider]  rosuvastatin (CRESTOR) 40 MG tablet TAKE 1 TABLET BY MOUTH DAILY 12/21/16   [name], MD  sertraline (ZOLOFT) 100 MG tablet 1 daily 12/21/16   [name], MD  tadalafil (CIALIS) 5 MG tablet Take 1 tablet (5 mg total) by mouth daily as needed. 03/05/14   Jaynee Eagles, PA-C  valACYclovir (VALTREX) 500 MG tablet Take 1 tablet (500 mg total) by mouth daily. 06/17/16   [name], MD    Past Medical History:  Diagnosis Date  . Asperger's syndrome  symptoms of  . Herpes simplex type II infection   . Hyperlipidemia   . Malignant neoplasm of prostate ([place])   . Multiple lipomas   . Premature ejaculation    07/2004  . Pulmonary embolism ([place])    04/2009  . Unspecified vitamin D deficiency     Past Surgical History:  Procedure Laterality Date  . CATARACT EXTRACTION, BILATERAL    . hip repalcement     2012  . KNEE ARTHROSCOPY W/ MENISCAL REPAIR Left 12/2014  . robotic prostatectomy     05/21/2009  . SHOULDER OPEN ROTATOR CUFF REPAIR      Social History   Tobacco Use  . Smoking status: Former Smoker    Packs/day: 1.00    Types: Cigarettes    Last attempt to quit: 03/29/2002    Years since  quitting: 14.9  . Smokeless tobacco: Never Used  Substance Use Topics  . Alcohol use: Yes    Alcohol/week: 7.2 - 8.4 oz    Types: 12 - 14 Shots of liquor per week    Family History  Problem Relation Age of Onset  . Heart disease Unknown   . Hyperlipidemia Unknown   . Diabetes Unknown        ?  . Diabetes Mother   . Hypertension Mother   . Rheum arthritis Mother   . Cancer Father   . Heart failure Father        "heart trouble"    Review of Systems  Constitutional: Negative for chills and fever.  Eyes: Negative for blurred vision and double vision.  Respiratory: Negative for cough and shortness of breath.   Cardiovascular: Negative for chest pain, palpitations and leg swelling.  Gastrointestinal: Negative for abdominal pain, nausea and vomiting.  Neurological: Negative for dizziness, sensory change, focal weakness and headaches.  Psychiatric/Behavioral: Positive for memory loss. Negative for depression and substance abuse. The patient is nervous/anxious.      OBJECTIVE:  Blood pressure 132/84, temperature 98.8 F (37.1 C), temperature source Oral, resp. rate 18, height 6' 3.87" (1.927 m), weight 285 lb 9.6 oz (129.5 kg), SpO2 97 %.  Physical Exam  Constitutional: He is oriented to person, place, and time and well-developed, well-nourished, and in no distress.  HENT:  Head: Normocephalic and atraumatic.  Right Ear: Hearing, tympanic membrane, external ear and ear canal normal.  Left Ear: Hearing, tympanic membrane, external ear and ear canal normal.  Mouth/Throat: Oropharynx is clear and moist. No oropharyngeal exudate.  Eyes: Conjunctivae and EOM are normal. Pupils are equal, round, and reactive to light.  Neck: Neck supple. Carotid bruit is not present. No thyroid mass and no thyromegaly present.  Cardiovascular: Normal rate, regular rhythm and normal heart sounds. Exam reveals no gallop and no friction rub.  No murmur heard. Pulmonary/Chest: Effort normal and breath  sounds normal. He has no wheezes. He has no rales.  Lymphadenopathy:    He has no cervical adenopathy.  Neurological: He is alert and oriented to person, place, and time. He has normal sensation, normal strength, normal reflexes and intact cranial nerves. He displays normal speech. He has a normal Cerebellar Exam and a normal Romberg Test. He shows no pronator drift. Gait normal.  Skin: Skin is warm and dry.  Psychiatric:  MOCA 29/30 Speech with clear and organized, patient however fidgety with constant tapping of feet, rubbing of left thumb to point of raw cracked mildly bleeding skin    Routine HCM labs done in 11/2016 - normal  ASSESSMENT and  PLAN 1. OSA on CPAP Discussed episodes witnessed by wife seem to be related to sleep-wake phenomena, normal neuro exam, will continue to monitor for now. Also discussed importance of continuing with nightly use of cpap and keeping appt with sleep medicine.   2. Anger Long standing issue, discussed counseling specifically for anger management, also discussed neuropsych eval to help clarify/confirm diagnosis of Asperger's and any potential behavioral/coping mechanisms that might help improve personal interactions and short term memory. - Ambulatory referral to Psychology  3. Poor short term memory See above - Ambulatory referral to Psychology  4. Asperger's disorder See above - Ambulatory referral to Psychology  Return for as scheduled with PCP.    Rutherford Guys, MD [facility] at [address], [place] [address] Ph.  [phone] Fax [phone]

## 2017-03-24 DIAGNOSIS — G4733 Obstructive sleep apnea (adult) (pediatric): Secondary | ICD-10-CM | POA: Diagnosis not present

## 2017-04-24 DIAGNOSIS — G4733 Obstructive sleep apnea (adult) (pediatric): Secondary | ICD-10-CM | POA: Diagnosis not present

## 2017-05-04 ENCOUNTER — Encounter: Payer: Self-pay | Admitting: Neurology

## 2017-05-04 ENCOUNTER — Ambulatory Visit: Payer: BLUE CROSS/BLUE SHIELD | Admitting: Neurology

## 2017-05-04 VITALS — BP 138/78 | HR 72 | Ht 75.0 in | Wt 290.0 lb

## 2017-05-04 DIAGNOSIS — G4733 Obstructive sleep apnea (adult) (pediatric): Secondary | ICD-10-CM | POA: Diagnosis not present

## 2017-05-04 DIAGNOSIS — Z9989 Dependence on other enabling machines and devices: Secondary | ICD-10-CM | POA: Diagnosis not present

## 2017-05-04 NOTE — Progress Notes (Signed)
Subjective:    Patient ID: Scott [name] is a 57 y.o. male.  HPI     Interim history:   Scott [name] is a 57 year old right-handed gentleman with an underlying medical history of hyperlipidemia, prostate cancer status post prostatectomy, mood disorder, vitamin D deficiency, arthritis with status post knee surgery, rotator cuff surgery, hip replacement, and status post bilateral cataract extractions, history of melanoma and obesity, who presents for follow-up consultation of his obstructive sleep apnea, after recent sleep study testing and starting CPAP therapy at home. The patient is unaccompanied today. I first met him on 01/11/2017 at the request of his primary care physician, at which time he reported snoring and sleep disruption, some dream enactments and daytime somnolence. I suggested we proceed with a sleep study. He had a split-night sleep study on 02/11/2017. I went over his test results with him in detail today. Baseline sleep latency was delayed at 38 minutes, REM sleep was absent. Sleep efficiency 63.8%. Loud snoring was noted. He had a significant increase in light stage sleep, total AHI of 78.1 per hour, average oxygen saturation of 95%, nadir of 79% and time below or at 88% saturation was over 1 hour. He had no significant PLMS. He was fitted with a nasal mask and CPAP was titrated from 5 cm to 8 cm. On the final pressure his AHI was 0.3 per hour, supine REM sleep was achieved but O2 nadir was 85%. He had no significant PLMS. He had 17% of slow-wave sleep and significant REM rebound at 42.5%. Based on his test results I prescribed CPAP therapy for home use at a pressure of 9 cm.  Today, 05/04/2017: I reviewed his CPAP compliance data from 04/03/2017 through 05/02/2017 which is a total of 30 days, during which time he used his CPAP every night with percent used days greater than 4 hours at 100%, indicating superb compliance with an average usage of 6 hours and 50 minutes, residual  AHI at goal at 1.9 per hour, leak acceptable with the 95th percentile at 12.4 L/m on a pressure of 9 cm with EPR of 3. He reports doing well with CPAP. He feels improved in his sleep consolidation and sleep quality. He does not mind using the nasal mask. A couple of times he had nasal congestion and it was difficult to use CPAP at the time which is understandable. He has not had any recent changes in his medical history or medications. He also first from significant arthritis in his right knee. He is status post left knee replacement surgery in June last year. He had cataract surgeries as well.  Previously:   01/11/2017: (He) reports snoring and some sleep disruption, has had some dream enactments infrequently as well. Snoring disturbs his wife and sometimes she sleeps in a different bedroom. His father use to snore loudly but was never tested for sleep apnea. I reviewed your office note from 12/21/2016. He has 2 kids from his first marriage, daughter age 69 and son age 1. He is the Pharmacologist at [employer]. He has had dream enactments infrequently.  He has no morning headaches, denies RLS symptoms or leg twitching, has nocturia 2-3 times per night, no obvious FHx of OSA. He had L TKA in June 2018 under Dr. Mayer Camel. Has R knee pain, s/p R THR in 2012. He had a PE after his prostatectomy. He is now on ASA '81mg'$ . his bedtime is typically around 10 and wake up time is around 5:45 AM. His Epworth  sleepiness score is 4 out of 24 today, fatigue score is 25 out of 63.  His Past Medical History Is Significant For: Past Medical History:  Diagnosis Date  . Asperger's syndrome    symptoms of  . Herpes simplex type II infection   . Hyperlipidemia   . Malignant neoplasm of prostate ([place])   . Multiple lipomas   . Premature ejaculation    07/2004  . Pulmonary embolism ([place])    04/2009  . Unspecified vitamin D deficiency     His Past Surgical History Is Significant For: Past Surgical History:   Procedure Laterality Date  . CATARACT EXTRACTION, BILATERAL    . hip repalcement     2012  . KNEE ARTHROSCOPY W/ MENISCAL REPAIR Left 12/2014  . robotic prostatectomy     05/21/2009  . SHOULDER OPEN ROTATOR CUFF REPAIR      His Family History Is Significant For: Family History  Problem Relation Age of Onset  . Heart disease Unknown   . Hyperlipidemia Unknown   . Diabetes Unknown        ?  . Diabetes Mother   . Hypertension Mother   . Rheum arthritis Mother   . Cancer Father   . Heart failure Father        "heart trouble"    His Social History Is Significant For: Social History   Socioeconomic History  . Marital status: Married    Spouse name: [name]  . Number of children: 2  . Years of education: None  . Highest education level: None  Social Needs  . Financial resource strain: None  . Food insecurity - worry: None  . Food insecurity - inability: None  . Transportation needs - medical: None  . Transportation needs - non-medical: None  Occupational History  . None  Tobacco Use  . Smoking status: Former Smoker    Packs/day: 1.00    Types: Cigarettes    Last attempt to quit: 03/29/2002    Years since quitting: 15.1  . Smokeless tobacco: Never Used  Substance and Sexual Activity  . Alcohol use: Yes    Alcohol/week: 7.2 - 8.4 oz    Types: 12 - 14 Shots of liquor per week  . Drug use: No  . Sexual activity: Yes  Other Topics Concern  . None  Social History Narrative  . None    His Allergies Are:  No Known Allergies:   His Current Medications Are:  Outpatient Encounter Medications as of 05/04/2017  Medication Sig  . aspirin EC 81 MG tablet Take 81 mg by mouth daily.  . rosuvastatin (CRESTOR) 40 MG tablet TAKE 1 TABLET BY MOUTH DAILY  . sertraline (ZOLOFT) 100 MG tablet 1 daily  . tadalafil (CIALIS) 5 MG tablet Take 1 tablet (5 mg total) by mouth daily as needed.  . valACYclovir (VALTREX) 500 MG tablet Take 1 tablet (500 mg total) by mouth daily.   No  facility-administered encounter medications on file as of 05/04/2017.   :  Review of Systems:  Out of a complete 14 point review of systems, all are reviewed and negative with the exception of these symptoms as listed below: Review of Systems  Neurological:       Pt presents today to follow up on his cpap. Pt says that he thinks he is feeling better on cpap therapy.    Objective:  Neurological Exam  Physical Exam Physical Examination:   Vitals:   05/04/17 0831  BP: 138/78  Pulse: 72  General Examination: The patient is a very pleasant 57 y.o. male in no acute distress. He appears well-developed and well-nourished and well groomed.   HEENT: Normocephalic, atraumatic, pupils are equal, round and reactive to light and accommodation. Extraocular tracking is good without limitation to gaze excursion or nystagmus noted. Normal smooth pursuit is noted. Hearing is grossly intact. Face is symmetric with normal facial animation and normal facial sensation. Speech is clear with no dysarthria noted. There is no hypophonia. There is no lip, neck/head, jaw or voice tremor. Neck is supple with full range of passive and active motion. There are no carotid bruits on auscultation. Oropharynx exam reveals: no change.   Chest: Clear to auscultation without wheezing, rhonchi or crackles noted.  Heart: S1+S2+0, regular and normal without murmurs, rubs or gallops noted.   Abdomen: Soft, non-tender and non-distended with normal bowel sounds appreciated on auscultation.  Extremities: There is no pitting edema in the distal lower extremities bilaterally. Pedal pulses are intact.  Skin: Warm and dry without trophic changes noted.  Musculoskeletal: exam reveals no obvious joint deformities, tenderness or joint swelling or erythema. R knee pain with crepitation noted, mild L knee swelling, no pain, but click noted.  Neurologically:  Mental status: The patient is awake, alert and oriented in all 4  spheres. His immediate and remote memory, attention, language skills and fund of knowledge are appropriate. There is no evidence of aphasia, agnosia, apraxia or anomia. Speech is clear with normal prosody and enunciation. Thought process is linear. Mood is normal and affect is normal.  Cranial nerves II - XII are as described above under HEENT exam. In addition: shoulder shrug is normal with equal shoulder height noted. Motor exam: Normal bulk, strength and tone is noted. There is no drift, or tremor. Reflexes are 1 to 2+ throughout. Fine motor skills and coordination: grossly intact.  Cerebellar testing: No dysmetria or intention tremor. There is no truncal or gait ataxia.  Sensory exam: intact to light touch in the upper and lower extremities.  Gait, station and balance: He stands easily. No veering to one side is noted. No leaning to one side is noted. Posture is age-appropriate and stance is narrow based. Gait shows normal stride length and normal pace. No problems turning are noted.   Assessment and Plan:  In summary, Scott [name] is a very pleasant 57 year old male with an underlying medical history of hyperlipidemia, prostate cancer status post prostatectomy, mood disorder, vitamin D deficiency, arthritis with status post knee surgery, rotator cuff surgery, hip replacement, and status post bilateral cataract extractions, history of melanoma and obesity, who presents for follow-up consultation of his severe obstructive sleep apnea as determined by his split-night sleep study on 02/11/2017. He has established treatment at home with CPAP at a pressure of 9 cm via nasal mask. He is fully compliant with CPAP and is highly commended for this. He is endorsing improvement in his sleep quality and sleep consolidation. He is advised to continue with treatment and follow-up routinely in 6 months, he can see one of our nurse practitioners at the time and so long as he continues to do well we can see him  once a year after that hopefully. He is reminded to stay up-to-date with his supplies and replacing his filters and we talked about the importance of cleaning his mask and humidifier chamber. His physical exam is stable. Unfortunately, he suffers from significant arthritis and may require right knee replacement at some point. He sees Dr. Mayer Camel  for this.  I again reviewed with him the risks and ramifications of untreated moderate to severe OSA, especially with respect to developing cardiovascular disease down the Road, including congestive heart failure, difficult to treat hypertension, cardiac arrhythmias, or stroke. Even type 2 diabetes has, in part, been linked to untreated OSA. Symptoms of untreated OSA include daytime sleepiness, memory problems, mood irritability and mood disorder such as depression and anxiety, lack of energy, as well as recurrent headaches, especially morning headaches. We talked about trying to maintain a healthy lifestyle in general, as well as the importance of weight control. I encouraged the patient to eat healthy, exercise daily and keep well hydrated, to keep a scheduled bedtime and wake time routine, to not skip any meals and eat healthy snacks in between meals. I advised the patient not to drive when feeling sleepy. I explained the importance of being compliant with PAP treatment, not only for insurance purposes but primarily to improve His symptoms, and for the patient's long term health benefit, including to reduce His cardiovascular risks. I answered all his questions today and the patient was in agreement. I spent 25 minutes in total face-to-face time with the patient, more than 50% of which was spent in counseling and coordination of care, reviewing test results, reviewing medication and discussing or reviewing the diagnosis of OSA, its prognosis and treatment options. Pertinent laboratory and imaging test results that were available during this visit with the patient were  reviewed by me and considered in my medical decision making (see chart for details).

## 2017-05-04 NOTE — Patient Instructions (Addendum)
Please continue using your CPAP regularly. While your insurance requires that you use CPAP at least 4 hours each night on 70% of the nights, I recommend, that you not skip any nights and use it throughout the night if you can. Getting used to CPAP and staying with the treatment long term does take time and patience and discipline. Untreated obstructive sleep apnea when it is moderate to severe can have an adverse impact on cardiovascular health and raise her risk for heart disease, arrhythmias, hypertension, congestive heart failure, stroke and diabetes. Untreated obstructive sleep apnea causes sleep disruption, nonrestorative sleep, and sleep deprivation. This can have an impact on your day to day functioning and cause daytime sleepiness and impairment of cognitive function, memory loss, mood disturbance, and problems focussing. Using CPAP regularly can improve these symptoms.  Keep up the good work! We can see you in 6 months, you can see one of our nurse practitioners as you are stable. I will see you after that.

## 2017-05-12 DIAGNOSIS — M25562 Pain in left knee: Secondary | ICD-10-CM | POA: Diagnosis not present

## 2017-05-24 DIAGNOSIS — M25561 Pain in right knee: Secondary | ICD-10-CM | POA: Diagnosis not present

## 2017-05-25 DIAGNOSIS — G4733 Obstructive sleep apnea (adult) (pediatric): Secondary | ICD-10-CM | POA: Diagnosis not present

## 2017-06-02 DIAGNOSIS — M1711 Unilateral primary osteoarthritis, right knee: Secondary | ICD-10-CM | POA: Diagnosis not present

## 2017-06-14 DIAGNOSIS — Z8546 Personal history of malignant neoplasm of prostate: Secondary | ICD-10-CM | POA: Diagnosis not present

## 2017-06-16 ENCOUNTER — Ambulatory Visit: Payer: BLUE CROSS/BLUE SHIELD | Admitting: Adult Health

## 2017-06-16 ENCOUNTER — Encounter: Payer: Self-pay | Admitting: Adult Health

## 2017-06-16 VITALS — BP 140/72 | HR 72 | Temp 97.7°F | Resp 16 | Ht 75.0 in | Wt 293.0 lb

## 2017-06-16 DIAGNOSIS — H6501 Acute serous otitis media, right ear: Secondary | ICD-10-CM

## 2017-06-16 DIAGNOSIS — J309 Allergic rhinitis, unspecified: Secondary | ICD-10-CM

## 2017-06-16 DIAGNOSIS — H60501 Unspecified acute noninfective otitis externa, right ear: Secondary | ICD-10-CM

## 2017-06-16 DIAGNOSIS — J329 Chronic sinusitis, unspecified: Secondary | ICD-10-CM

## 2017-06-16 MED ORDER — AMOXICILLIN-POT CLAVULANATE 875-125 MG PO TABS: 1.0000 | ORAL_TABLET | Freq: Two times a day (BID) | ORAL | 0 refills | Status: DC

## 2017-06-16 MED ORDER — NEOMYCIN-POLYMYXIN-HC 3.5-10000-1 OT SOLN: 3.0000 [drp] | Freq: Four times a day (QID) | OTIC | 0 refills | Status: DC

## 2017-06-16 NOTE — Progress Notes (Signed)
Subjective:     Patient ID: Scott [name], male   DOB: March 04, 1961, 57 y.o.   MRN: [id number]  HPI  Patient is a 57 year old male in no acute distress with ear fullness in right ear x 2 weeks - he reports waking of and right ear has pressure and fullness. Left ear mildly full feeling. He also has sinus congestion x 1 - 2 months.   He tried Claritin x 1 week with mild improvement.  He has knee surgery April 5th meniscal tears - small he reports. History of PE last surgery 2011.   Blood pressure 140/72, pulse 72, temperature 97.7 F (36.5 C), resp. rate 16, height 6\' 3"  (1.905 m), weight 293 lb (132.9 kg), SpO2 96 %.   Current Outpatient Medications:  .  aspirin EC 81 MG tablet, Take 81 mg by mouth daily., Disp: , Rfl:  .  calcium-vitamin D 250-100 MG-UNIT tablet, Take 1 tablet by mouth 2 (two) times daily., Disp: , Rfl:  .  rosuvastatin (CRESTOR) 40 MG tablet, TAKE 1 TABLET BY MOUTH DAILY, Disp: 90 tablet, Rfl: 1 .  sertraline (ZOLOFT) 100 MG tablet, 1 daily, Disp: 90 tablet, Rfl: 1 .  tadalafil (CIALIS) 5 MG tablet, Take 1 tablet (5 mg total) by mouth daily as needed., Disp: 10 tablet, Rfl: 11 .  traMADol (ULTRAM) 50 MG tablet, , Disp: , Rfl: 0 .  valACYclovir (VALTREX) 500 MG tablet, Take 1 tablet (500 mg total) by mouth daily., Disp: 30 tablet, Rfl: 11   Review of Systems  Constitutional: Negative.   HENT: Positive for congestion, ear pain (fullness / irritated he reports ), postnasal drip, rhinorrhea and sinus pressure (mild maxillary intermittent / notices allergies bothering more in last two months- usually no allergies he reports ). Negative for dental problem, drooling, ear discharge, facial swelling, hearing loss, mouth sores, nosebleeds, sinus pain, sneezing, sore throat, tinnitus, trouble swallowing and voice change.   Eyes: Negative.   Respiratory: Negative.   Cardiovascular: Negative.   Gastrointestinal: Negative.   Genitourinary: Negative.   Musculoskeletal: Negative.         Knee - being followed and surgery scheduled. Denies any new symptoms.   Skin: Negative.   Allergic/Immunologic:       No Known Allergies   Neurological: Negative.   Hematological: Negative.   Psychiatric/Behavioral: Negative.        Objective:   Physical Exam  Constitutional: He is oriented to person, place, and time. Vital signs are normal. He appears well-developed and well-nourished. He is active.  Non-toxic appearance. He does not have a sickly appearance. He does not appear ill. No distress.  Patient moves on and off of exam table and in room without difficulty. Gait is normal in hall and in room. Patient is oriented to person place time and situation. Patient answers questions appropriately and engages in conversation.   HENT:  Head: Normocephalic and atraumatic.  Right Ear: There is swelling (ear canal with white exudate and mild to moderately swollen able to visualize tympanic membrane. ). No tenderness. No mastoid tenderness. Tympanic membrane is erythematous (white exudate in small area of tympanic membrane scattered near 6 o'clock ). Tympanic membrane is not perforated, not retracted and not bulging. Tympanic membrane mobility is normal. A middle ear effusion is present. Right ear decreased hearing: at times when waking and ear feels more full per patient   Left Ear: Hearing, external ear and ear canal normal. No drainage, swelling or tenderness. No mastoid tenderness. Tympanic  membrane is not perforated and not erythematous. A middle ear effusion is present. No decreased hearing is noted.  Nose: Mucosal edema and rhinorrhea present. Right sinus exhibits maxillary sinus tenderness. Right sinus exhibits no frontal sinus tenderness. Left sinus exhibits maxillary sinus tenderness. Left sinus exhibits no frontal sinus tenderness.  Mouth/Throat: Uvula is midline, oropharynx is clear and moist and mucous membranes are normal. Mucous membranes are not pale, not dry and not cyanotic.  No uvula swelling. No oropharyngeal exudate, posterior oropharyngeal edema, posterior oropharyngeal erythema or tonsillar abscesses.  Mild bilaterally sinus pressure  Maxillary.   Denies any recent infections or history of.   Eyes: Pupils are equal, round, and reactive to light. Conjunctivae and EOM are normal. Right eye exhibits no discharge. Left eye exhibits no discharge. No scleral icterus.  Neck: Normal range of motion. Neck supple. No JVD present. No tracheal deviation present.  Cardiovascular: Normal rate, regular rhythm, normal heart sounds and intact distal pulses. Exam reveals no gallop and no friction rub.  No murmur heard. Pulmonary/Chest: Effort normal and breath sounds normal. No stridor. No respiratory distress. He has no wheezes. He has no rales. He exhibits no tenderness.  Abdominal: Soft. Bowel sounds are normal.  Musculoskeletal: Normal range of motion.  Lymphadenopathy:    He has no cervical adenopathy.  Neurological: He is alert and oriented to person, place, and time. He displays normal reflexes. No cranial nerve deficit. He exhibits normal muscle tone. Coordination normal.  Skin: Skin is warm and dry. No rash noted. He is not diaphoretic. No erythema. No pallor.  Psychiatric: He has a normal mood and affect. His behavior is normal. Judgment and thought content normal.  Vitals reviewed.      Assessment:      Right acute serous otitis media, recurrence not specified  Acute otitis externa of right ear, unspecified type  Allergic rhinitis, unspecified seasonality, unspecified trigger  Sinusitis, unspecified chronicity, unspecified location   Plan:     Patient denies any allergies.  No Known Allergies  . Meds ordered this encounter  Medications  . amoxicillin-clavulanate (AUGMENTIN) 875-125 MG tablet    Sig: Take 1 tablet by mouth 2 (two) times daily.    Dispense:  20 tablet    Refill:  0  . neomycin-polymyxin-hydrocortisone (CORTISPORIN) OTIC solution     Sig: Place 3 drops into the right ear 4 (four) times daily.    Dispense:  10 mL    Refill:  0    Start antihistamine without decongestant such as Zyrtec or Claritin- start only one of choice and per package instructions daily. Take seasonally. Inform surgeon for knee of all medications you are taking.   Return to clinic at any time  if any new symptoms change, worsen or do not improve. Symptoms should improve  within 72 hours and if not improving you should call for an appointment at the clinic or if worsening symptoms be seen in urgent care/emergency room  if clinic is closed. If any emergent symptoms call 911 at anytime. Patient verbalized above understanding of all instructions and denies any further questions at this time.

## 2017-06-16 NOTE — Patient Instructions (Addendum)
Allergic Rhinitis, Adult Allergic rhinitis is an allergic reaction that affects the mucous membrane inside the nose. It causes sneezing, a runny or stuffy nose, and the feeling of mucus going down the back of the throat (postnasal drip). Allergic rhinitis can be mild to severe. There are two types of allergic rhinitis:  Seasonal. This type is also called hay fever. It happens only during certain seasons.  Perennial. This type can happen at any time of the year.  What are the causes? This condition happens when the body's defense system (immune system) responds to certain harmless substances called allergens as though they were germs.  Seasonal allergic rhinitis is triggered by pollen, which can come from grasses, trees, and weeds. Perennial allergic rhinitis may be caused by:  House dust mites.  Pet dander.  Mold spores.  What are the signs or symptoms? Symptoms of this condition include:  Sneezing.  Runny or stuffy nose (nasal congestion).  Postnasal drip.  Itchy nose.  Tearing of the eyes.  Trouble sleeping.  Daytime sleepiness.  How is this diagnosed? This condition may be diagnosed based on:  Your medical history.  A physical exam.  Tests to check for related conditions, such as: ? Asthma. ? Pink eye. ? Ear infection. ? Upper respiratory infection.  Tests to find out which allergens trigger your symptoms. These may include skin or blood tests.  How is this treated? There is no cure for this condition, but treatment can help control symptoms. Treatment may include:  Taking medicines that block allergy symptoms, such as antihistamines. Medicine may be given as a shot, nasal spray, or pill.  Avoiding the allergen.  Desensitization. This treatment involves getting ongoing shots until your body becomes less sensitive to the allergen. This treatment may be done if other treatments do not help.  If taking medicine and avoiding the allergen does not work, new,  stronger medicines may be prescribed.  Follow these instructions at home:  Find out what you are allergic to. Common allergens include smoke, dust, and pollen.  Avoid the things you are allergic to. These are some things you can do to help avoid allergens: ? Replace carpet with wood, tile, or vinyl flooring. Carpet can trap dander and dust. ? Do not smoke. Do not allow smoking in your home. ? Change your heating and air conditioning filter at least once a month. ? During allergy season:  Keep windows closed as much as possible.  Plan outdoor activities when pollen counts are lowest. This is usually during the evening hours.  When coming indoors, change clothing and shower before sitting on furniture or bedding.  Take over-the-counter and prescription medicines only as told by your health care provider.  Keep all follow-up visits as told by your health care provider. This is important. Contact a health care provider if:  You have a fever.  You develop a persistent cough.  You make whistling sounds when you breathe (you wheeze).  Your symptoms interfere with your normal daily activities. Get help right away if:  You have shortness of breath. Summary  This condition can be managed by taking medicines as directed and avoiding allergens.  Contact your health care provider if you develop a persistent cough or fever.  During allergy season, keep windows closed as much as possible. This information is not intended to replace advice given to you by your health care provider. Make sure you discuss any questions you have with your health care provider. Document Released: 12/08/2000 Document Revised: 04/22/2016  Document Reviewed: 04/22/2016 Elsevier Interactive Patient Education  2018 [employer]. Otitis Externa Otitis externa is an infection of the outer ear canal. The outer ear canal is the area between the outside of the ear and the eardrum. Otitis externa is sometimes called  "swimmer's ear." Follow these instructions at home:  If you were given antibiotic ear drops, use them as told by your doctor. Do not stop using them even if your condition gets better.  Take over-the-counter and prescription medicines only as told by your doctor.  Keep all follow-up visits as told by your doctor. This is important. How is this prevented?  Keep your ear dry. Use the corner of a towel to dry your ear after you swim or bathe.  Try not to scratch or put things in your ear. Doing these things makes it easier for germs to grow in your ear.  Avoid swimming in lakes, dirty water, or pools that may not have the right amount of a chemical called chlorine.  Consider making ear drops and putting 3 or 4 drops in each ear after you swim. Ask your doctor about how you can make ear drops. Contact a doctor if:  You have a fever.  After 3 days your ear is still red, swollen, or painful.  After 3 days you still have pus coming from your ear.  Your redness, swelling, or pain gets worse.  You have a really bad headache.  You have redness, swelling, pain, or tenderness behind your ear. This information is not intended to replace advice given to you by your health care provider. Make sure you discuss any questions you have with your health care provider. Document Released: 09/01/2007 Document Revised: 04/10/2015 Document Reviewed: 12/23/2014 Elsevier Interactive Patient Education  2018 [employer]. Otitis Media, Adult Otitis media is redness, soreness, and puffiness (swelling) in the space just behind your eardrum (middle ear). It may be caused by allergies or infection. It often happens along with a cold. Follow these instructions at home:  Take your medicine as told. Finish it even if you start to feel better.  Only take over-the-counter or prescription medicines for pain, discomfort, or fever as told by your doctor.  Follow up with your doctor as told. Contact a doctor  if:  You have otitis media only in one ear, or bleeding from your nose, or both.  You notice a lump on your neck.  You are not getting better in 3-5 days.  You feel worse instead of better. Get help right away if:  You have pain that is not helped with medicine.  You have puffiness, redness, or pain around your ear.  You get a stiff neck.  You cannot move part of your face (paralysis).  You notice that the bone behind your ear hurts when you touch it. This information is not intended to replace advice given to you by your health care provider. Make sure you discuss any questions you have with your health care provider. Document Released: 09/01/2007 Document Revised: 08/21/2015 Document Reviewed: 10/10/2012 Elsevier Interactive Patient Education  2017 [employer].

## 2017-06-21 ENCOUNTER — Other Ambulatory Visit: Payer: BLUE CROSS/BLUE SHIELD

## 2017-06-21 DIAGNOSIS — Z01812 Encounter for preprocedural laboratory examination: Secondary | ICD-10-CM

## 2017-06-22 DIAGNOSIS — N486 Induration penis plastica: Secondary | ICD-10-CM | POA: Diagnosis not present

## 2017-06-22 DIAGNOSIS — Z8546 Personal history of malignant neoplasm of prostate: Secondary | ICD-10-CM | POA: Diagnosis not present

## 2017-06-22 DIAGNOSIS — N5201 Erectile dysfunction due to arterial insufficiency: Secondary | ICD-10-CM | POA: Diagnosis not present

## 2017-06-22 DIAGNOSIS — G4733 Obstructive sleep apnea (adult) (pediatric): Secondary | ICD-10-CM | POA: Diagnosis not present

## 2017-06-22 LAB — CBC WITH DIFFERENTIAL/PLATELET
BASOS ABS: 0 10*3/uL (ref 0.0–0.2)
BASOS: 0 %
EOS (ABSOLUTE): 0.1 10*3/uL (ref 0.0–0.4)
Eos: 1 %
HEMOGLOBIN: 14.4 g/dL (ref 13.0–17.7)
Hematocrit: 43.1 % (ref 37.5–51.0)
IMMATURE GRANS (ABS): 0 10*3/uL (ref 0.0–0.1)
IMMATURE GRANULOCYTES: 0 %
Lymphocytes Absolute: 1.5 10*3/uL (ref 0.7–3.1)
Lymphs: 26 %
MCH: 31.1 pg (ref 26.6–33.0)
MCHC: 33.4 g/dL (ref 31.5–35.7)
MCV: 93 fL (ref 79–97)
MONOCYTES: 6 %
Monocytes Absolute: 0.3 10*3/uL (ref 0.1–0.9)
NEUTROS ABS: 3.8 10*3/uL (ref 1.4–7.0)
NEUTROS PCT: 67 %
PLATELETS: 174 10*3/uL (ref 150–379)
RBC: 4.63 x10E6/uL (ref 4.14–5.80)
RDW: 13.9 % (ref 12.3–15.4)
WBC: 5.8 10*3/uL (ref 3.4–10.8)

## 2017-06-22 LAB — BASIC METABOLIC PANEL
BUN/Creatinine Ratio: 15 (ref 9–20)
BUN: 13 mg/dL (ref 6–24)
CHLORIDE: 100 mmol/L (ref 96–106)
CO2: 23 mmol/L (ref 20–29)
CREATININE: 0.84 mg/dL (ref 0.76–1.27)
Calcium: 9.4 mg/dL (ref 8.7–10.2)
GFR calc Af Amer: 112 mL/min/{1.73_m2} (ref >59)
GFR calc non Af Amer: 97 mL/min/{1.73_m2} (ref >59)
GLUCOSE: 116 mg/dL — AB (ref 65–99)
Potassium: 4.2 mmol/L (ref 3.5–5.2)
Sodium: 137 mmol/L (ref 134–144)

## 2017-06-22 LAB — PROTIME-INR
INR: 1.1 (ref 0.8–1.2)
Prothrombin Time: 11.1 s (ref 9.1–12.0)

## 2017-06-27 DIAGNOSIS — Z85820 Personal history of malignant melanoma of skin: Secondary | ICD-10-CM | POA: Diagnosis not present

## 2017-06-27 DIAGNOSIS — D229 Melanocytic nevi, unspecified: Secondary | ICD-10-CM | POA: Diagnosis not present

## 2017-06-27 DIAGNOSIS — L814 Other melanin hyperpigmentation: Secondary | ICD-10-CM | POA: Diagnosis not present

## 2017-06-27 DIAGNOSIS — L821 Other seborrheic keratosis: Secondary | ICD-10-CM | POA: Diagnosis not present

## 2017-06-27 DIAGNOSIS — L570 Actinic keratosis: Secondary | ICD-10-CM | POA: Diagnosis not present

## 2017-06-28 ENCOUNTER — Ambulatory Visit: Payer: BLUE CROSS/BLUE SHIELD | Admitting: Medical

## 2017-06-28 VITALS — BP 134/85 | HR 83 | Temp 97.7°F | Resp 16 | Ht 75.0 in | Wt 297.4 lb

## 2017-06-28 DIAGNOSIS — H6121 Impacted cerumen, right ear: Secondary | ICD-10-CM

## 2017-06-28 NOTE — Progress Notes (Signed)
   Subjective:    Patient ID: Scott [name], male    DOB: 05-18-1960, 57 y.o.   MRN: [id number]  HPI  57 yo male in non acute distress. Feels fullness and pressure right ear. Treated for otitis externa with drops and   Friday has knee replacement right.  Review of Systems  Constitutional: Negative for chills and fatigue.  HENT: Positive for congestion, ear pain (just pressure) and sinus pressure (1/10). Negative for ear discharge, sinus pain, sneezing, sore throat and voice change.   Respiratory: Negative for cough and wheezing.   Cardiovascular: Negative for chest pain and leg swelling.  Gastrointestinal: Negative for abdominal pain.  Genitourinary: Negative for dysuria.  Musculoskeletal: Negative for myalgias.  Skin: Negative for rash.  Neurological: Positive for headaches (slightly dull forehead ). Negative for dizziness, syncope and light-headedness.  Hematological: Negative for adenopathy.  Psychiatric/Behavioral: Negative for behavioral problems, self-injury and suicidal ideas. The patient is not nervous/anxious.    Trouble hearing out of right ear    Objective:   Physical Exam  Constitutional: He is oriented to person, place, and time. He appears well-developed and well-nourished.  HENT:  Head: Normocephalic and atraumatic.  Right Ear: External ear normal.  Left Ear: External ear normal.  Eyes: Pupils are equal, round, and reactive to light. Conjunctivae and EOM are normal.  Neck: Normal range of motion. Neck supple.  Cardiovascular: Normal rate, regular rhythm and normal heart sounds.  Pulmonary/Chest: Effort normal and breath sounds normal.  Neurological: He is alert and oriented to person, place, and time.  Skin: Skin is warm and dry.  Psychiatric: He has a normal mood and affect. His behavior is normal. Judgment and thought content normal.  Nursing note and vitals reviewed.    Cerumen ear in right ear TM obstructed.      Assessment & Plan:  Cerumen in  right ear Irrigated right ear . Not successful in removign wax. OTC Debrox otic drops  5-10 drops x 4 days bid  Then rince in shower. Follow up with [place] in the clinic or your family doctor for recheck.Patient verbalizes understanding and has no questions at discharge.

## 2017-06-28 NOTE — Patient Instructions (Addendum)
OTC Debrox otic drops  5-10 drops twice a day to the right ear. X 4 days  Then try to rinse in the shower. Follow up with this clinic or your family doctor if not improving.    Earwax Buildup, Adult The ears produce a substance called earwax that helps keep bacteria out of the ear and protects the skin in the ear canal. Occasionally, earwax can build up in the ear and cause discomfort or hearing loss. What increases the risk? This condition is more likely to develop in people who:  Are male.  Are elderly.  Naturally produce more earwax.  Clean their ears often with cotton swabs.  Use earplugs often.  Use in-ear headphones often.  Wear hearing aids.  Have narrow ear canals.  Have earwax that is overly thick or sticky.  Have eczema.  Are dehydrated.  Have excess hair in the ear canal.  What are the signs or symptoms? Symptoms of this condition include:  Reduced or muffled hearing.  A feeling of fullness in the ear or feeling that the ear is plugged.  Fluid coming from the ear.  Ear pain.  Ear itch.  Ringing in the ear.  Coughing.  An obvious piece of earwax that can be seen inside the ear canal.  How is this diagnosed? This condition may be diagnosed based on:  Your symptoms.  Your medical history.  An ear exam. During the exam, your health care provider will look into your ear with an instrument called an otoscope.  You may have tests, including a hearing test. How is this treated? This condition may be treated by:  Using ear drops to soften the earwax.  Having the earwax removed by a health care provider. The health care provider may: ? Flush the ear with water. ? Use an instrument that has a loop on the end (curette). ? Use a suction device.  Surgery to remove the wax buildup. This may be done in severe cases.  Follow these instructions at home:  Take over-the-counter and prescription medicines only as told by your health care  provider.  Do not put any objects, including cotton swabs, into your ear. You can clean the opening of your ear canal with a washcloth or facial tissue.  Follow instructions from your health care provider about cleaning your ears. Do not over-clean your ears.  Drink enough fluid to keep your urine clear or pale yellow. This will help to thin the earwax.  Keep all follow-up visits as told by your health care provider. If earwax builds up in your ears often or if you use hearing aids, consider seeing your health care provider for routine, preventive ear cleanings. Ask your health care provider how often you should schedule your cleanings.  If you have hearing aids, clean them according to instructions from the manufacturer and your health care provider. Contact a health care provider if:  You have ear pain.  You develop a fever.  You have blood, pus, or other fluid coming from your ear.  You have hearing loss.  You have ringing in your ears that does not go away.  Your symptoms do not improve with treatment.  You feel like the room is spinning (vertigo). Summary  Earwax can build up in the ear and cause discomfort or hearing loss.  The most common symptoms of this condition include reduced or muffled hearing and a feeling of fullness in the ear or feeling that the ear is plugged.  This condition  may be diagnosed based on your symptoms, your medical history, and an ear exam.  This condition may be treated by using ear drops to soften the earwax or by having the earwax removed by a health care provider.  Do not put any objects, including cotton swabs, into your ear. You can clean the opening of your ear canal with a washcloth or facial tissue. This information is not intended to replace advice given to you by your health care provider. Make sure you discuss any questions you have with your health care provider. Document Released: 04/22/2004 Document Revised: 05/26/2016 Document  Reviewed: 05/26/2016 Elsevier Interactive Patient Education  [employer].

## 2017-06-29 ENCOUNTER — Ambulatory Visit: Payer: BLUE CROSS/BLUE SHIELD | Admitting: Medical

## 2017-06-29 DIAGNOSIS — M1711 Unilateral primary osteoarthritis, right knee: Secondary | ICD-10-CM | POA: Diagnosis not present

## 2017-07-01 DIAGNOSIS — Z96651 Presence of right artificial knee joint: Secondary | ICD-10-CM | POA: Diagnosis not present

## 2017-07-01 DIAGNOSIS — M1711 Unilateral primary osteoarthritis, right knee: Secondary | ICD-10-CM | POA: Diagnosis not present

## 2017-07-07 DIAGNOSIS — M1711 Unilateral primary osteoarthritis, right knee: Secondary | ICD-10-CM | POA: Diagnosis not present

## 2017-07-12 DIAGNOSIS — M1711 Unilateral primary osteoarthritis, right knee: Secondary | ICD-10-CM | POA: Diagnosis not present

## 2017-07-14 DIAGNOSIS — M25561 Pain in right knee: Secondary | ICD-10-CM | POA: Diagnosis not present

## 2017-07-14 DIAGNOSIS — Z96651 Presence of right artificial knee joint: Secondary | ICD-10-CM | POA: Diagnosis not present

## 2017-07-14 DIAGNOSIS — Z471 Aftercare following joint replacement surgery: Secondary | ICD-10-CM | POA: Diagnosis not present

## 2017-07-14 DIAGNOSIS — M1711 Unilateral primary osteoarthritis, right knee: Secondary | ICD-10-CM | POA: Diagnosis not present

## 2017-07-19 DIAGNOSIS — M1711 Unilateral primary osteoarthritis, right knee: Secondary | ICD-10-CM | POA: Diagnosis not present

## 2017-07-21 DIAGNOSIS — M1711 Unilateral primary osteoarthritis, right knee: Secondary | ICD-10-CM | POA: Diagnosis not present

## 2017-07-23 DIAGNOSIS — G4733 Obstructive sleep apnea (adult) (pediatric): Secondary | ICD-10-CM | POA: Diagnosis not present

## 2017-07-26 DIAGNOSIS — M1711 Unilateral primary osteoarthritis, right knee: Secondary | ICD-10-CM | POA: Diagnosis not present

## 2017-07-28 DIAGNOSIS — M1711 Unilateral primary osteoarthritis, right knee: Secondary | ICD-10-CM | POA: Diagnosis not present

## 2017-08-10 DIAGNOSIS — Z96651 Presence of right artificial knee joint: Secondary | ICD-10-CM | POA: Diagnosis not present

## 2017-08-10 DIAGNOSIS — Z96652 Presence of left artificial knee joint: Secondary | ICD-10-CM | POA: Diagnosis not present

## 2017-08-10 DIAGNOSIS — Z09 Encounter for follow-up examination after completed treatment for conditions other than malignant neoplasm: Secondary | ICD-10-CM | POA: Diagnosis not present

## 2017-08-22 DIAGNOSIS — G4733 Obstructive sleep apnea (adult) (pediatric): Secondary | ICD-10-CM | POA: Diagnosis not present

## 2017-09-07 DIAGNOSIS — M25562 Pain in left knee: Secondary | ICD-10-CM | POA: Diagnosis not present

## 2017-09-22 DIAGNOSIS — G4733 Obstructive sleep apnea (adult) (pediatric): Secondary | ICD-10-CM | POA: Diagnosis not present

## 2017-10-22 DIAGNOSIS — G4733 Obstructive sleep apnea (adult) (pediatric): Secondary | ICD-10-CM | POA: Diagnosis not present

## 2017-11-06 ENCOUNTER — Encounter: Payer: Self-pay | Admitting: Neurology

## 2017-11-08 ENCOUNTER — Encounter: Payer: Self-pay | Admitting: Adult Health

## 2017-11-08 ENCOUNTER — Ambulatory Visit: Payer: BLUE CROSS/BLUE SHIELD | Admitting: Adult Health

## 2017-11-08 ENCOUNTER — Ambulatory Visit (INDEPENDENT_AMBULATORY_CARE_PROVIDER_SITE_OTHER): Payer: BLUE CROSS/BLUE SHIELD | Admitting: Family Medicine

## 2017-11-08 ENCOUNTER — Encounter: Payer: Self-pay | Admitting: Family Medicine

## 2017-11-08 VITALS — BP 127/75 | HR 89 | Temp 98.3°F | Ht 75.0 in | Wt 291.4 lb

## 2017-11-08 VITALS — BP 132/82 | HR 64 | Ht 75.0 in | Wt 291.0 lb

## 2017-11-08 DIAGNOSIS — Z9989 Dependence on other enabling machines and devices: Secondary | ICD-10-CM

## 2017-11-08 DIAGNOSIS — H60391 Other infective otitis externa, right ear: Secondary | ICD-10-CM | POA: Diagnosis not present

## 2017-11-08 DIAGNOSIS — H9202 Otalgia, left ear: Secondary | ICD-10-CM | POA: Diagnosis not present

## 2017-11-08 DIAGNOSIS — G4733 Obstructive sleep apnea (adult) (pediatric): Secondary | ICD-10-CM

## 2017-11-08 MED ORDER — OFLOXACIN 0.3 % OT SOLN: 10.0000 [drp] | Freq: Every day | OTIC | 0 refills | Status: AC

## 2017-11-08 NOTE — Progress Notes (Addendum)
PATIENT: Scott [name] DOB: May 21, 1960  REASON FOR VISIT: follow up HISTORY FROM: patient  HISTORY OF PRESENT ILLNESS: Today 11/08/17:  Scott [name] is a 57 year old male with a history of obstructive sleep apnea on CPAP.  He returns today for follow-up.  His CPAP download indicates that he uses machine 30 out of 30 days for compliance of 100%.  On average he uses his machine 7 hours and 24 minutes.  His residual AHI is 2.3 on 9 cmH2O with EPR 3.  He is weak in the 95th percentile is 15.4 L/min.  He reports that the CPAP continues to work well for him.  His Epworth sleepiness score is 3.  He returns today for evaluation.  HISTORY 05/04/2017: I reviewed his CPAP compliance data from 04/03/2017 through 05/02/2017 which is a total of 30 days, during which time he used his CPAP every night with percent used days greater than 4 hours at 100%, indicating superb compliance with an average usage of 6 hours and 50 minutes, residual AHI at goal at 1.9 per hour, leak acceptable with the 95th percentile at 12.4 L/m on a pressure of 9 cm with EPR of 3. He reports doing well with CPAP. He feels improved in his sleep consolidation and sleep quality. He does not mind using the nasal mask. A couple of times he had nasal congestion and it was difficult to use CPAP at the time which is understandable. He has not had any recent changes in his medical history or medications. He also first from significant arthritis in his right knee. He is status post left knee replacement surgery in June last year. He had cataract surgeries as well.  REVIEW OF SYSTEMS: Out of a complete 14 system review of symptoms, the patient complains only of the following symptoms, and all other reviewed systems are negative.  See HPI  ALLERGIES: No Known Allergies  HOME MEDICATIONS: Outpatient Medications Prior to Visit  Medication Sig Dispense Refill  . aspirin EC 81 MG tablet Take 81 mg by mouth daily.    . rosuvastatin  (CRESTOR) 40 MG tablet TAKE 1 TABLET BY MOUTH DAILY 90 tablet 1  . sertraline (ZOLOFT) 100 MG tablet 1 daily 90 tablet 1  . tadalafil (CIALIS) 5 MG tablet Take 1 tablet (5 mg total) by mouth daily as needed. 10 tablet 11  . valACYclovir (VALTREX) 500 MG tablet Take 1 tablet (500 mg total) by mouth daily. (Patient taking differently: Take 500 mg by mouth as needed. ) 30 tablet 11  . amoxicillin-clavulanate (AUGMENTIN) 875-125 MG tablet Take 1 tablet by mouth 2 (two) times daily. 20 tablet 0  . calcium-vitamin D 250-100 MG-UNIT tablet Take 1 tablet by mouth 2 (two) times daily.    Marland Kitchen neomycin-polymyxin-hydrocortisone (CORTISPORIN) OTIC solution Place 3 drops into the right ear 4 (four) times daily. 10 mL 0  . traMADol (ULTRAM) 50 MG tablet   0   No facility-administered medications prior to visit.     PAST MEDICAL HISTORY: Past Medical History:  Diagnosis Date  . Asperger's syndrome    symptoms of  . Herpes simplex type II infection   . Hyperlipidemia   . Malignant neoplasm of prostate ([place])   . Multiple lipomas   . Premature ejaculation    07/2004  . Pulmonary embolism ([place])    04/2009  . Unspecified vitamin D deficiency     PAST SURGICAL HISTORY: Past Surgical History:  Procedure Laterality Date  . CATARACT EXTRACTION, BILATERAL    .  hip repalcement     2012  . KNEE ARTHROSCOPY W/ MENISCAL REPAIR Left 12/2014  . robotic prostatectomy     05/21/2009  . SHOULDER OPEN ROTATOR CUFF REPAIR      FAMILY HISTORY: Family History  Problem Relation Age of Onset  . Heart disease Unknown   . Hyperlipidemia Unknown   . Diabetes Unknown        ?  . Diabetes Mother   . Hypertension Mother   . Rheum arthritis Mother   . Cancer Father   . Heart failure Father        "heart trouble"    SOCIAL HISTORY: Social History   Socioeconomic History  . Marital status: Married    Spouse name: Scott [name]  . Number of children: 2  . Years of education: Not on file  . Highest education  level: Not on file  Occupational History  . Not on file  Social Needs  . Financial resource strain: Not on file  . Food insecurity:    Worry: Not on file    Inability: Not on file  . Transportation needs:    Medical: Not on file    Non-medical: Not on file  Tobacco Use  . Smoking status: Former Smoker    Packs/day: 1.00    Types: Cigarettes    Last attempt to quit: 03/29/2002    Years since quitting: 15.6  . Smokeless tobacco: Never Used  Substance and Sexual Activity  . Alcohol use: Yes    Alcohol/week: 12.0 - 14.0 standard drinks    Types: 12 - 14 Shots of liquor per week  . Drug use: No  . Sexual activity: Yes  Lifestyle  . Physical activity:    Days per week: Not on file    Minutes per session: Not on file  . Stress: Not on file  Relationships  . Social connections:    Talks on phone: Not on file    Gets together: Not on file    Attends religious service: Not on file    Active member of club or organization: Not on file    Attends meetings of clubs or organizations: Not on file    Relationship status: Not on file  . Intimate partner violence:    Fear of current or ex partner: Not on file    Emotionally abused: Not on file    Physically abused: Not on file    Forced sexual activity: Not on file  Other Topics Concern  . Not on file  Social History Narrative  . Not on file      PHYSICAL EXAM  Vitals:   11/08/17 0826  BP: 132/82  Pulse: 64  Weight: 291 lb (132 kg)  Height: 6\' 3"  (1.905 m)   Body mass index is 37.17 kg/m.  Generalized: Well developed, in no acute distress   Neurological examination  Mentation: Alert oriented to time, place, history taking. Follows all commands speech and language fluent Cranial nerve II-XII: Pupils were equal round reactive to light. Extraocular movements were full, visual field were full on confrontational test. Facial sensation and strength were normal. Uvula tongue midline. Head turning and shoulder shrug  were normal  and symmetric. Motor: The motor testing reveals 5 over 5 strength of all 4 extremities. Good symmetric motor tone is noted throughout.  Sensory: Sensory testing is intact to soft touch on all 4 extremities. No evidence of extinction is noted.  Coordination: Cerebellar testing reveals good finger-nose-finger and heel-to-shin bilaterally.  Gait and  station: Gait is normal. Tandem gait is normal. Romberg is negative. No drift is seen.  Reflexes: Deep tendon reflexes are symmetric and normal bilaterally.   DIAGNOSTIC DATA (LABS, IMAGING, TESTING) - I reviewed patient records, labs, notes, testing and imaging myself where available.  Lab Results  Component Value Date   WBC 5.8 06/21/2017   HGB 14.4 06/21/2017   HCT 43.1 06/21/2017   MCV 93 06/21/2017   PLT 174 06/21/2017      Component Value Date/Time   NA 137 06/21/2017 0801   K 4.2 06/21/2017 0801   CL 100 06/21/2017 0801   CO2 23 06/21/2017 0801   GLUCOSE 116 (H) 06/21/2017 0801   GLUCOSE 94 07/10/2015 1056   BUN 13 06/21/2017 0801   CREATININE 0.84 06/21/2017 0801   CREATININE 0.86 07/10/2015 1056   CALCIUM 9.4 06/21/2017 0801   PROT 7.4 12/21/2016 1155   ALBUMIN 4.7 12/21/2016 1155   AST 22 12/21/2016 1155   ALT 13 12/21/2016 1155   ALKPHOS 113 12/21/2016 1155   BILITOT 0.5 12/21/2016 1155   GFRNONAA 97 06/21/2017 0801   GFRNONAA >89 07/10/2015 1056   GFRAA 112 06/21/2017 0801   GFRAA >89 07/10/2015 1056   Lab Results  Component Value Date   CHOL 292 (H) 12/21/2016   HDL 51 12/21/2016   LDLCALC 217 (H) 12/21/2016   TRIG 122 12/21/2016   CHOLHDL 5.7 (H) 12/21/2016     Lab Results  Component Value Date   TSH 2.310 12/21/2016      ASSESSMENT AND PLAN 57 y.o. year old male  has a past medical history of Asperger's syndrome, Herpes simplex type II infection, Hyperlipidemia, Malignant neoplasm of prostate ([place]), Multiple lipomas, Premature ejaculation, Pulmonary embolism ([place]), and Unspecified vitamin D  deficiency. here with:  1.  Obstructive sleep apnea on CPAP  The patient CPAP download shows a compliance and good treatment of his apnea.  He is encouraged to continue using the CPAP nightly and greater than 4 hours each night.  If his symptoms worsen or he develop new symptoms he should let us know.  We will follow-up in 1 year or sooner if needed or sooner if needed.   I spent 15 minutes with the patient. 50% of this time was spent reviewing his CPAP download   Ward Givens, MSN, NP-C 11/08/2017, 8:35 AM [facility] [address], [place], [place] [address] [phone]  I reviewed the above note and documentation by the Nurse Practitioner and agree with the history, physical exam, assessment and plan as outlined above. I was immediately available for face-to-face consultation. Star Age, MD, PhD [facility])

## 2017-11-08 NOTE — Progress Notes (Signed)
Subjective:  By signing my name below, I, Delton Coombes, attest that this documentation has been prepared under the direction and in the presence of [name], MD Electronically Signed: Delton Coombes Medical Scribe 11/08/2017   Patient ID: Scott [name], male    DOB: [birth year], 57 y.o.   MRN: [id number] Chief Complaint  Patient presents with  . right ear clogged  . left ear tingling    HPI Scott [name] is a 57 y.o. male who presents to Primary Care at [facility] complaining of right ear being clogged and tingling feeling in his left ear. He reports that this has been going on for a couple of weeks. He denies having swimmers ear in the past. He reports that he went to the beach last week. He does reports that his jaw has been hurting on the left side at times. He reports taking sertraline only when needed.    Patient Active Problem List   Diagnosis Date Noted  . OSA on CPAP 03/11/2017  . Obesity 07/10/2015  . Anger 12/16/2013  . Hyperlipidemia   . Unspecified vitamin D deficiency   . Herpes simplex type II infection    Past Medical History:  Diagnosis Date  . Asperger's syndrome    symptoms of  . Herpes simplex type II infection   . Hyperlipidemia   . Malignant neoplasm of prostate ([place])   . Multiple lipomas   . Premature ejaculation    07/2004  . Pulmonary embolism ([place])    04/2009  . Unspecified vitamin D deficiency    Past Surgical History:  Procedure Laterality Date  . CATARACT EXTRACTION, BILATERAL    . hip repalcement     2012  . KNEE ARTHROSCOPY W/ MENISCAL REPAIR Left 12/2014  . robotic prostatectomy     05/21/2009  . SHOULDER OPEN ROTATOR CUFF REPAIR     No Known Allergies Prior to Admission medications   Medication Sig Start Date End Date Taking? Authorizing Provider  aspirin EC 81 MG tablet Take 81 mg by mouth daily.    [provider]  rosuvastatin (CRESTOR) 40 MG tablet TAKE 1 TABLET BY MOUTH DAILY 12/21/16   [name], MD  sertraline (ZOLOFT) 100 MG tablet 1 daily 12/21/16   [name], MD  tadalafil (CIALIS) 5 MG tablet Take 1 tablet (5 mg total) by mouth daily as needed. 03/05/14   Jaynee Eagles, PA-C  valACYclovir (VALTREX) 500 MG tablet Take 1 tablet (500 mg total) by mouth daily. Patient taking differently: Take 500 mg by mouth as needed.  06/17/16   [name], MD   Social History   Socioeconomic History  . Marital status: Married    Spouse name: [name]  . Number of children: 2  . Years of education: Not on file  . Highest education level: Not on file  Occupational History  . Not on file  Social Needs  . Financial resource strain: Not on file  . Food insecurity:    Worry: Not on file    Inability: Not on file  . Transportation needs:    Medical: Not on file    Non-medical: Not on file  Tobacco Use  . Smoking status: Former Smoker    Packs/day: 1.00    Types: Cigarettes    Last attempt to quit: 03/29/2002    Years since quitting: 15.6  . Smokeless tobacco: Never Used  Substance and Sexual Activity  . Alcohol use: Yes    Alcohol/week: 12.0 -  14.0 standard drinks    Types: 12 - 14 Shots of liquor per week  . Drug use: No  . Sexual activity: Yes  Lifestyle  . Physical activity:    Days per week: Not on file    Minutes per session: Not on file  . Stress: Not on file  Relationships  . Social connections:    Talks on phone: Not on file    Gets together: Not on file    Attends religious service: Not on file    Active member of club or organization: Not on file    Attends meetings of clubs or organizations: Not on file    Relationship status: Not on file  . Intimate partner violence:    Fear of current or ex partner: Not on file    Emotionally abused: Not on file    Physically abused: Not on file    Forced sexual activity: Not on file  Other Topics Concern  . Not on file  Social History Narrative  . Not on file     Review of Systems     Objective:   Physical  Exam  Constitutional: He is oriented to person, place, and time. He appears well-developed and well-nourished.  HENT:  Head: Normocephalic and atraumatic.  Right Ear: Tympanic membrane, external ear and ear canal normal. There is swelling. No tenderness.  Left Ear: Tympanic membrane, external ear and ear canal normal. No swelling or tenderness.  Ears:  Nose: No rhinorrhea.  Mouth/Throat: Oropharynx is clear and moist and mucous membranes are normal. No oropharyngeal exudate or posterior oropharyngeal erythema.  The right ear has some mild swelling, the ear canal has some exudate, slightly grey with some edema. External ear is non tender. The left ear is fairly grey, with slight edema of the canal. No external rash, or facial rash on left ear. No lymphadenopathy noted.      Eyes: Pupils are equal, round, and reactive to light. Conjunctivae are normal.  Neck: Neck supple.  Cardiovascular: Normal rate, regular rhythm, normal heart sounds and intact distal pulses.  No murmur heard. Pulmonary/Chest: Effort normal and breath sounds normal. He has no wheezes. He has no rhonchi. He has no rales.  Abdominal: Soft. There is no tenderness.  Lymphadenopathy:    He has no cervical adenopathy.  Neurological: He is alert and oriented to person, place, and time.  Skin: Skin is warm and dry. No rash noted.  Psychiatric: He has a normal mood and affect. His behavior is normal.  Vitals reviewed.  Vitals:   11/08/17 1522  BP: 127/75  Pulse: 89  Temp: 98.3 F (36.8 C)  TempSrc: Oral  SpO2: 95%  Weight: 291 lb 6.4 oz (132.2 kg)  Height: 6\' 3"  (1.905 m)       Assessment & Plan:    Scott [name] is a 57 y.o. male Infective otitis externa of right ear - Plan: ofloxacin (FLOXIN OTIC) 0.3 % OTIC solution  Discomfort of left ear - Plan: ofloxacin (FLOXIN OTIC) 0.3 % OTIC solution Suspected otitis externa of right ear, possible early left-sided.  Floxin otic drops prescribed, can use in both  ears if needed and RTC precautions.  Handout given.  Meds ordered this encounter  Medications  . ofloxacin (FLOXIN OTIC) 0.3 % OTIC solution    Sig: Place 10 drops into both ears daily for 7 days.    Dispense:  10 mL    Refill:  0   Patient Instructions    Start  antibiotic drops for right ear and if you have continued discomfort in the left ear start in that side as well.  If discomfort and hearing out of the right ear is not improving in the next few days, return for recheck.  Sooner if worse.  If symptoms have not improved within a week to 10 days, return for recheck.  Thank you for coming in today   Otitis Externa Otitis externa is an infection of the outer ear canal. The outer ear canal is the area between the outside of the ear and the eardrum. Otitis externa is sometimes called "swimmer's ear." What are the causes? This condition may be caused by:  Swimming in dirty water.  Moisture in the ear.  An injury to the inside of the ear.  An object stuck in the ear.  A cut or scrape on the outside of the ear.  What increases the risk? This condition is more likely to develop in swimmers. What are the signs or symptoms? The first symptom of this condition is often itching in the ear. Later signs and symptoms include:  Swelling of the ear.  Redness in the ear.  Ear pain. The pain may get worse when you pull on your ear.  Pus coming from the ear.  How is this diagnosed? This condition may be diagnosed by examining the ear and testing fluid from the ear for bacteria and funguses. How is this treated? This condition may be treated with:  Antibiotic ear drops. These are often given for 10-14 days.  Medicine to reduce itching and swelling.  Follow these instructions at home:  If you were prescribed antibiotic ear drops, apply them as told by your health care provider. Do not stop using the antibiotic even if your condition improves.  Take over-the-counter and  prescription medicines only as told by your health care provider.  Keep all follow-up visits as told by your health care provider. This is important. How is this prevented?  Keep your ear dry. Use the corner of a towel to dry your ear after you swim or bathe.  Avoid scratching or putting things in your ear. Doing these things can damage the ear canal or remove the protective wax that lines it, which makes it easier for bacteria and funguses to grow.  Avoid swimming in lakes, polluted water, or pools that may not have the right amount of chlorine.  Consider making ear drops and putting 3 or 4 drops in each ear after you swim. Ask your health care provider about how you can make ear drops. Contact a health care provider if:  You have a fever.  After 3 days your ear is still red, swollen, painful, or draining pus.  Your redness, swelling, or pain gets worse.  You have a severe headache.  You have redness, swelling, pain, or tenderness in the area behind your ear. This information is not intended to replace advice given to you by your health care provider. Make sure you discuss any questions you have with your health care provider. Document Released: 03/15/2005 Document Revised: 04/22/2015 Document Reviewed: 12/23/2014 Elsevier Interactive Patient Education  2018 [employer].   IF you received an x-ray today, you will receive an invoice from [facility]. Please contact [facility] at [phone] with questions or concerns regarding your invoice.   IF you received labwork today, you will receive an invoice from [facility]. Please contact [facility] at [phone] with questions or concerns regarding your invoice.   Our billing staff  will not be able to assist you with questions regarding bills from these companies.  You will be contacted with the lab results as soon as they are available. The fastest way to get your results is to activate your My Chart account.  Instructions are located on the last page of this paperwork. If you have not heard from [place] regarding the results in 2 weeks, please contact this office.       I personally performed the services described in this documentation, which was scribed in my presence. The recorded information has been reviewed and considered for accuracy and completeness, addended by me as needed, and agree with information above.  Signed,   Merri Ray, MD [facility].  11/13/17 4:25 PM

## 2017-11-08 NOTE — Patient Instructions (Signed)
Your Plan:  Continue using CPAP nightly and >4 hours each night If your symptoms worsen or you develop new symptoms please let us know.   Thank you for coming to see us at [facility]. I hope we have been able to provide you high quality care today.  You may receive a patient satisfaction survey over the next few weeks. We would appreciate your feedback and comments so that we may continue to improve ourselves and the health of our patients.  

## 2017-11-08 NOTE — Patient Instructions (Addendum)
Start antibiotic drops for right ear and if you have continued discomfort in the left ear start in that side as well.  If discomfort and hearing out of the right ear is not improving in the next few days, return for recheck.  Sooner if worse.  If symptoms have not improved within a week to 10 days, return for recheck.  Thank you for coming in today   Otitis Externa Otitis externa is an infection of the outer ear canal. The outer ear canal is the area between the outside of the ear and the eardrum. Otitis externa is sometimes called "swimmer's ear." What are the causes? This condition may be caused by:  Swimming in dirty water.  Moisture in the ear.  An injury to the inside of the ear.  An object stuck in the ear.  A cut or scrape on the outside of the ear.  What increases the risk? This condition is more likely to develop in swimmers. What are the signs or symptoms? The first symptom of this condition is often itching in the ear. Later signs and symptoms include:  Swelling of the ear.  Redness in the ear.  Ear pain. The pain may get worse when you pull on your ear.  Pus coming from the ear.  How is this diagnosed? This condition may be diagnosed by examining the ear and testing fluid from the ear for bacteria and funguses. How is this treated? This condition may be treated with:  Antibiotic ear drops. These are often given for 10-14 days.  Medicine to reduce itching and swelling.  Follow these instructions at home:  If you were prescribed antibiotic ear drops, apply them as told by your health care provider. Do not stop using the antibiotic even if your condition improves.  Take over-the-counter and prescription medicines only as told by your health care provider.  Keep all follow-up visits as told by your health care provider. This is important. How is this prevented?  Keep your ear dry. Use the corner of a towel to dry your ear after you swim or bathe.  Avoid  scratching or putting things in your ear. Doing these things can damage the ear canal or remove the protective wax that lines it, which makes it easier for bacteria and funguses to grow.  Avoid swimming in lakes, polluted water, or pools that may not have the right amount of chlorine.  Consider making ear drops and putting 3 or 4 drops in each ear after you swim. Ask your health care provider about how you can make ear drops. Contact a health care provider if:  You have a fever.  After 3 days your ear is still red, swollen, painful, or draining pus.  Your redness, swelling, or pain gets worse.  You have a severe headache.  You have redness, swelling, pain, or tenderness in the area behind your ear. This information is not intended to replace advice given to you by your health care provider. Make sure you discuss any questions you have with your health care provider. Document Released: 03/15/2005 Document Revised: 04/22/2015 Document Reviewed: 12/23/2014 Elsevier Interactive Patient Education  2018 [employer].   IF you received an x-ray today, you will receive an invoice from [facility]. Please contact [facility] at [phone] with questions or concerns regarding your invoice.   IF you received labwork today, you will receive an invoice from [place]. Please contact [facility] at [phone] with questions or concerns regarding your invoice.   Our billing  staff will not be able to assist you with questions regarding bills from these companies.  You will be contacted with the lab results as soon as they are available. The fastest way to get your results is to activate your My Chart account. Instructions are located on the last page of this paperwork. If you have not heard from us regarding the results in 2 weeks, please contact this office.      

## 2017-11-22 DIAGNOSIS — G4733 Obstructive sleep apnea (adult) (pediatric): Secondary | ICD-10-CM | POA: Diagnosis not present

## 2017-12-26 ENCOUNTER — Other Ambulatory Visit: Payer: Self-pay | Admitting: Family Medicine

## 2017-12-26 DIAGNOSIS — E785 Hyperlipidemia, unspecified: Secondary | ICD-10-CM

## 2017-12-26 DIAGNOSIS — F3289 Other specified depressive episodes: Secondary | ICD-10-CM

## 2017-12-27 DIAGNOSIS — L821 Other seborrheic keratosis: Secondary | ICD-10-CM | POA: Diagnosis not present

## 2017-12-27 DIAGNOSIS — D229 Melanocytic nevi, unspecified: Secondary | ICD-10-CM | POA: Diagnosis not present

## 2017-12-27 DIAGNOSIS — L814 Other melanin hyperpigmentation: Secondary | ICD-10-CM | POA: Diagnosis not present

## 2017-12-27 DIAGNOSIS — D1801 Hemangioma of skin and subcutaneous tissue: Secondary | ICD-10-CM | POA: Diagnosis not present

## 2017-12-27 NOTE — Telephone Encounter (Signed)
Requested Prescriptions  Pending Prescriptions Disp Refills  . sertraline (ZOLOFT) 100 MG tablet [Pharmacy Med Name: SERTRALINE 100MG  TABLETS] 90 tablet 1    Sig: TAKE 1 TABLET BY MOUTH DAILY     Psychiatry:  Antidepressants - SSRI Passed - 12/27/2017 11:04 AM      Passed - Valid encounter within last 6 months    Recent Outpatient Visits          1 month ago Infective otitis externa of right ear   [facility], Ranell Patrick, MD   9 months ago OSA on CPAP   [facility], Lilia Argue, MD   11 months ago Depression, unspecified depression type   [facility], Ranell Patrick, MD   1 year ago Annual physical exam   [facility], Ranell Patrick, MD   1 year ago Preop examination   [facility], Ranell Patrick, MD           . rosuvastatin (CRESTOR) 40 MG tablet [Pharmacy Med Name: ROSUVASTATIN 40MG  TABLETS] 90 tablet 0    Sig: TAKE 1 TABLET BY MOUTH DAILY     Cardiovascular:  Antilipid - Statins Failed - 12/27/2017 11:04 AM      Failed - Total Cholesterol in normal range and within 360 days    Cholesterol, Total  Date Value Ref Range Status  12/21/2016 292 (H) 100 - 199 mg/dL Final         Failed - LDL in normal range and within 360 days    LDL Calculated  Date Value Ref Range Status  12/21/2016 217 (H) 0 - 99 mg/dL Final         Failed - HDL in normal range and within 360 days    HDL  Date Value Ref Range Status  12/21/2016 51 >39 mg/dL Final         Failed - Triglycerides in normal range and within 360 days    Triglycerides  Date Value Ref Range Status  12/21/2016 122 0 - 149 mg/dL Final         Passed - Patient is not pregnant      Passed - Valid encounter within last 12 months    Recent Outpatient Visits          1 month ago Infective otitis externa of right ear   [facility], MD   9 months ago OSA on CPAP   [facility], Lilia Argue, MD   11 months ago  Depression, unspecified depression type   [facility], Ranell Patrick, MD   1 year ago Annual physical exam   [facility], Ranell Patrick, MD   1 year ago Preop examination   [facility], Ranell Patrick, MD

## 2017-12-28 DIAGNOSIS — M25561 Pain in right knee: Secondary | ICD-10-CM | POA: Diagnosis not present

## 2017-12-28 DIAGNOSIS — Z96651 Presence of right artificial knee joint: Secondary | ICD-10-CM | POA: Diagnosis not present

## 2017-12-28 DIAGNOSIS — Z09 Encounter for follow-up examination after completed treatment for conditions other than malignant neoplasm: Secondary | ICD-10-CM | POA: Diagnosis not present

## 2017-12-28 DIAGNOSIS — Z96652 Presence of left artificial knee joint: Secondary | ICD-10-CM | POA: Diagnosis not present

## 2017-12-28 DIAGNOSIS — M25562 Pain in left knee: Secondary | ICD-10-CM | POA: Diagnosis not present

## 2018-01-28 ENCOUNTER — Other Ambulatory Visit: Payer: Self-pay | Admitting: Family Medicine

## 2018-01-28 DIAGNOSIS — E785 Hyperlipidemia, unspecified: Secondary | ICD-10-CM

## 2018-01-30 NOTE — Telephone Encounter (Signed)
Requested medication (s) are due for refill today: Yes  Requested medication (s) are on the active medication list: Yes  Last refill:  12/27/17  Future visit scheduled: No  Notes to clinic:  Unable to refill per protocol, need labs and CPE     Requested Prescriptions  Pending Prescriptions Disp Refills   rosuvastatin (CRESTOR) 40 MG tablet [Pharmacy Med Name: ROSUVASTATIN 40MG  TABLETS] 30 tablet 0    Sig: TAKE 1 TABLET BY MOUTH DAILY     Cardiovascular:  Antilipid - Statins Failed - 01/30/2018 12:10 PM      Failed - Total Cholesterol in normal range and within 360 days    Cholesterol, Total  Date Value Ref Range Status  12/21/2016 292 (H) 100 - 199 mg/dL Final         Failed - LDL in normal range and within 360 days    LDL Calculated  Date Value Ref Range Status  12/21/2016 217 (H) 0 - 99 mg/dL Final         Failed - HDL in normal range and within 360 days    HDL  Date Value Ref Range Status  12/21/2016 51 >39 mg/dL Final         Failed - Triglycerides in normal range and within 360 days    Triglycerides  Date Value Ref Range Status  12/21/2016 122 0 - 149 mg/dL Final         Passed - Patient is not pregnant      Passed - Valid encounter within last 12 months    Recent Outpatient Visits          2 months ago Infective otitis externa of right ear   [facility] [place], MD   10 months ago OSA on CPAP   [facility], Lilia Argue, MD   1 year ago Depression, unspecified depression type   [facility], Ranell Patrick, MD   1 year ago Annual physical exam   [facility], Ranell Patrick, MD   1 year ago Preop examination   [facility], Ranell Patrick, MD

## 2018-01-30 NOTE — Telephone Encounter (Signed)
Patient called, left VM to return call to the office to schedule office visit. Will need annual physical appointment, last CPE/labs 01/25/17.

## 2018-03-07 ENCOUNTER — Encounter: Payer: Self-pay | Admitting: Medical

## 2018-03-07 ENCOUNTER — Ambulatory Visit: Payer: BLUE CROSS/BLUE SHIELD | Admitting: Medical

## 2018-03-07 VITALS — BP 139/82 | HR 86 | Temp 98.7°F | Resp 18 | Wt 296.6 lb

## 2018-03-07 DIAGNOSIS — R05 Cough: Secondary | ICD-10-CM

## 2018-03-07 DIAGNOSIS — J069 Acute upper respiratory infection, unspecified: Secondary | ICD-10-CM

## 2018-03-07 DIAGNOSIS — J0100 Acute maxillary sinusitis, unspecified: Secondary | ICD-10-CM

## 2018-03-07 DIAGNOSIS — R059 Cough, unspecified: Secondary | ICD-10-CM

## 2018-03-07 MED ORDER — BENZONATATE 100 MG PO CAPS: 100.0000 mg | ORAL_CAPSULE | Freq: Three times a day (TID) | ORAL | 0 refills | Status: DC | PRN

## 2018-03-07 MED ORDER — AMOXICILLIN-POT CLAVULANATE 875-125 MG PO TABS: 1.0000 | ORAL_TABLET | Freq: Two times a day (BID) | ORAL | 0 refills | Status: DC

## 2018-03-07 NOTE — Progress Notes (Addendum)
   Subjective:    Patient ID: Scott [name], male    DOB: 1960-11-30, 57 y.o.   MRN: [id number]  HPI 57 yo male in non acute distress.Started 9 days ago with cough yellow productive. Nasal congestion , sore throat that now is resolved. Denies fever or chills. Denies shortness of breath or chest pain. Taking a OTC decongestant [employer] brand.  Blood pressure 139/82, pulse 86, temperature 98.7 F (37.1 C), temperature source Tympanic, resp. rate 18, weight 296 lb 9.6 oz (134.5 kg), SpO2 96 %.   Hx of Pneumonia 20 years ago, not hospitalized. Bronchitis about  40 years ago. Review of Systems  Constitutional: Positive for appetite change (decreased) and fatigue (little not sleeping as well due to cough).  HENT: Positive for congestion, sinus pressure (earlier in illness now resolved.), sinus pain and voice change (little bit). Negative for ear pain and sore throat.   Eyes: Negative for discharge (initially now better) and itching.  Respiratory: Positive for cough. Negative for chest tightness, shortness of breath and wheezing.   Cardiovascular: Negative for chest pain.  Gastrointestinal: Negative for abdominal pain.  Endocrine: Negative for polydipsia and polyphagia.  Genitourinary: Negative for dysuria.  Musculoskeletal: Negative for myalgias.  Skin: Negative for rash.  Allergic/Immunologic: Negative for environmental allergies and food allergies.  Neurological: Positive for headaches (with coughing). Negative for dizziness, syncope and light-headedness.  Hematological: Negative for adenopathy.  Psychiatric/Behavioral: Negative for behavioral problems, confusion, self-injury and suicidal ideas.       Objective:   Physical Exam  Constitutional: He is oriented to person, place, and time. He appears well-developed and well-nourished.  HENT:  Head: Normocephalic and atraumatic.  Right Ear: Hearing, external ear and ear canal normal. A middle ear effusion is present.  Left Ear: Hearing,  external ear and ear canal normal. A middle ear effusion is present.  Nose: Mucosal edema and rhinorrhea present.  Mouth/Throat: Oropharynx is clear and moist and mucous membranes are normal. Uvula swelling present. No oropharyngeal exudate, posterior oropharyngeal edema, posterior oropharyngeal erythema or tonsillar abscesses. Tonsils are 2+ on the right. Tonsils are 2+ on the left.  Eyes: Pupils are equal, round, and reactive to light. Conjunctivae are normal.  Neck: Normal range of motion. Neck supple.  Cardiovascular: Normal rate, regular rhythm and normal heart sounds.  Pulmonary/Chest: Effort normal and breath sounds normal.  Lymphadenopathy:    He has no cervical adenopathy.  Neurological: He is alert and oriented to person, place, and time.  Skin: Skin is warm and dry.  Psychiatric: He has a normal mood and affect. His behavior is normal. Judgment and thought content normal.  Nursing note and vitals reviewed.         Assessment & Plan:  Sinusitis Upper Respiratory Infection Cough .non smoker Meds ordered this encounter  Medications  . amoxicillin-clavulanate (AUGMENTIN) 875-125 MG tablet    Sig: Take 1 tablet by mouth 2 (two) times daily.    Dispense:  20 tablet    Refill:  0  . benzonatate (TESSALON PERLES) 100 MG capsule    Sig: Take 1 capsule (100 mg total) by mouth 3 (three) times daily as needed.    Dispense:  30 capsule    Refill:  0  Rest , increase fluids, take OTC Zyrtec , Flonase as directed on package to help with  PND. Return in 3-5 days if not improving or if worsening . He verbalizes understanding and has no questions at discharge.

## 2018-03-07 NOTE — Patient Instructions (Signed)
Upper Respiratory Infection, Adult Most upper respiratory infections (URIs) are caused by a virus. A URI affects the nose, throat, and upper air passages. The most common type of URI is often called "the common cold." Follow these instructions at home:  Take medicines only as told by your doctor.  Gargle warm saltwater or take cough drops to comfort your throat as told by your doctor.  Use a warm mist humidifier or inhale steam from a shower to increase air moisture. This may make it easier to breathe.  Drink enough fluid to keep your pee (urine) clear or pale yellow.  Eat soups and other clear broths.  Have a healthy diet.  Rest as needed.  Go back to work when your fever is gone or your doctor says it is okay. ? You may need to stay home longer to avoid giving your URI to others. ? You can also wear a face mask and wash your hands often to prevent spread of the virus.  Use your inhaler more if you have asthma.  Do not use any tobacco products, including cigarettes, chewing tobacco, or electronic cigarettes. If you need help quitting, ask your doctor. Contact a doctor if:  You are getting worse, not better.  Your symptoms are not helped by medicine.  You have chills.  You are getting more short of breath.  You have brown or red mucus.  You have yellow or brown discharge from your nose.  You have pain in your face, especially when you bend forward.  You have a fever.  You have puffy (swollen) neck glands.  You have pain while swallowing.  You have white areas in the back of your throat. Get help right away if:  You have very bad or constant: ? Headache. ? Ear pain. ? Pain in your forehead, behind your eyes, and over your cheekbones (sinus pain). ? Chest pain.  You have long-lasting (chronic) lung disease and any of the following: ? Wheezing. ? Long-lasting cough. ? Coughing up blood. ? A change in your usual mucus.  You have a stiff neck.  You have  changes in your: ? Vision. ? Hearing. ? Thinking. ? Mood. This information is not intended to replace advice given to you by your health care provider. Make sure you discuss any questions you have with your health care provider. Document Released: 09/01/2007 Document Revised: 11/16/2015 Document Reviewed: 06/20/2013 Elsevier Interactive Patient Education  2018 [employer]. Cough, Adult A cough helps to clear your throat and lungs. A cough may last only 2-3 weeks (acute), or it may last longer than 8 weeks (chronic). Many different things can cause a cough. A cough may be a sign of an illness or another medical condition. Follow these instructions at home:  Pay attention to any changes in your cough.  Take medicines only as told by your doctor. ? If you were prescribed an antibiotic medicine, take it as told by your doctor. Do not stop taking it even if you start to feel better. ? Talk with your doctor before you try using a cough medicine.  Drink enough fluid to keep your pee (urine) clear or pale yellow.  If the air is dry, use a cold steam vaporizer or humidifier in your home.  Stay away from things that make you cough at work or at home.  If your cough is worse at night, try using extra pillows to raise your head up higher while you sleep.  Do not smoke, and try not  to be around smoke. If you need help quitting, ask your doctor.  Do not have caffeine.  Do not drink alcohol.  Rest as needed. Contact a doctor if:  You have new problems (symptoms).  You cough up yellow fluid (pus).  Your cough does not get better after 2-3 weeks, or your cough gets worse.  Medicine does not help your cough and you are not sleeping well.  You have pain that gets worse or pain that is not helped with medicine.  You have a fever.  You are losing weight and you do not know why.  You have night sweats. Get help right away if:  You cough up blood.  You have trouble breathing.  Your  heartbeat is very fast. This information is not intended to replace advice given to you by your health care provider. Make sure you discuss any questions you have with your health care provider. Document Released: 11/26/2010 Document Revised: 08/21/2015 Document Reviewed: 05/22/2014 Elsevier Interactive Patient Education  2018 [employer]. Sinusitis, Adult Sinusitis is soreness and inflammation of your sinuses. Sinuses are hollow spaces in the bones around your face. They are located:  Around your eyes.  In the middle of your forehead.  Behind your nose.  In your cheekbones.  Your sinuses and nasal passages are lined with a stringy fluid (mucus). Mucus normally drains out of your sinuses. When your nasal tissues get inflamed or swollen, the mucus can get trapped or blocked so air cannot flow through your sinuses. This lets bacteria, viruses, and funguses grow, and that leads to infection. Follow these instructions at home: Medicines  Take, use, or apply over-the-counter and prescription medicines only as told by your doctor. These may include nasal sprays.  If you were prescribed an antibiotic medicine, take it as told by your doctor. Do not stop taking the antibiotic even if you start to feel better. Hydrate and Humidify  Drink enough water to keep your pee (urine) clear or pale yellow.  Use a cool mist humidifier to keep the humidity level in your home above 50%.  Breathe in steam for 10-15 minutes, 3-4 times a day or as told by your doctor. You can do this in the bathroom while a hot shower is running.  Try not to spend time in cool or dry air. Rest  Rest as much as possible.  Sleep with your head raised (elevated).  Make sure to get enough sleep each night. General instructions  Put a warm, moist washcloth on your face 3-4 times a day or as told by your doctor. This will help with discomfort.  Wash your hands often with soap and water. If there is no soap and water,  use hand sanitizer.  Do not smoke. Avoid being around people who are smoking (secondhand smoke).  Keep all follow-up visits as told by your doctor. This is important. Contact a doctor if:  You have a fever.  Your symptoms get worse.  Your symptoms do not get better within 10 days. Get help right away if:  You have a very bad headache.  You cannot stop throwing up (vomiting).  You have pain or swelling around your face or eyes.  You have trouble seeing.  You feel confused.  Your neck is stiff.  You have trouble breathing. This information is not intended to replace advice given to you by your health care provider. Make sure you discuss any questions you have with your health care provider. Document Released: 09/01/2007 Document Revised:  11/09/2015 Document Reviewed: 01/08/2015 Elsevier Interactive Patient Education  [employer].

## 2018-03-15 DIAGNOSIS — D481 Neoplasm of uncertain behavior of connective and other soft tissue: Secondary | ICD-10-CM | POA: Diagnosis not present

## 2018-03-21 ENCOUNTER — Other Ambulatory Visit: Payer: Self-pay | Admitting: Plastic Surgery

## 2018-03-21 DIAGNOSIS — D1722 Benign lipomatous neoplasm of skin and subcutaneous tissue of left arm: Secondary | ICD-10-CM | POA: Diagnosis not present

## 2018-03-21 DIAGNOSIS — L720 Epidermal cyst: Secondary | ICD-10-CM | POA: Diagnosis not present

## 2018-04-05 DIAGNOSIS — D481 Neoplasm of uncertain behavior of connective and other soft tissue: Secondary | ICD-10-CM | POA: Diagnosis not present

## 2018-04-10 ENCOUNTER — Ambulatory Visit: Payer: BLUE CROSS/BLUE SHIELD | Admitting: Emergency Medicine

## 2018-04-10 ENCOUNTER — Encounter: Payer: Self-pay | Admitting: Emergency Medicine

## 2018-04-10 ENCOUNTER — Other Ambulatory Visit: Payer: Self-pay

## 2018-04-10 VITALS — BP 125/80 | HR 94 | Temp 99.7°F | Resp 16 | Ht 74.75 in | Wt 295.0 lb

## 2018-04-10 DIAGNOSIS — R05 Cough: Secondary | ICD-10-CM

## 2018-04-10 DIAGNOSIS — R059 Cough, unspecified: Secondary | ICD-10-CM

## 2018-04-10 DIAGNOSIS — J22 Unspecified acute lower respiratory infection: Secondary | ICD-10-CM

## 2018-04-10 MED ORDER — PROMETHAZINE-DM 6.25-15 MG/5ML PO SYRP: 5.0000 mL | ORAL_SOLUTION | Freq: Four times a day (QID) | ORAL | 0 refills | Status: DC | PRN

## 2018-04-10 MED ORDER — DOXYCYCLINE HYCLATE 100 MG PO TABS: 100.0000 mg | ORAL_TABLET | Freq: Two times a day (BID) | ORAL | 0 refills | Status: AC

## 2018-04-10 MED ORDER — BENZONATATE 200 MG PO CAPS: 200.0000 mg | ORAL_CAPSULE | Freq: Two times a day (BID) | ORAL | 0 refills | Status: DC | PRN

## 2018-04-10 NOTE — Progress Notes (Signed)
Scott [name] 58 y.o.   Chief Complaint  Patient presents with  . Cough    productive - greenish/yellowish, per patient he had this since befor Thanksgiving and gotten worse    HISTORY OF PRESENT ILLNESS: This is a 58 y.o. male complaining of productive cough for the past several days.  Denies difficulty breathing or chest pain.  Non-smoker.  No history of asthma or COPD.  No other significant symptoms.  HPI   Prior to Admission medications   Medication Sig Start Date End Date Taking? Authorizing Provider  aspirin EC 81 MG tablet Take 81 mg by mouth daily.   Yes [provider]  rosuvastatin (CRESTOR) 40 MG tablet TAKE 1 TABLET BY MOUTH DAILY 12/27/17  Yes [name], MD  sertraline (ZOLOFT) 100 MG tablet TAKE 1 TABLET BY MOUTH DAILY 12/27/17  Yes [name], MD  tadalafil (CIALIS) 5 MG tablet Take 1 tablet (5 mg total) by mouth daily as needed. 03/05/14  Yes Jaynee Eagles, PA-C  valACYclovir (VALTREX) 500 MG tablet Take 1 tablet (500 mg total) by mouth daily. Patient taking differently: Take 500 mg by mouth as needed.  06/17/16  Yes [name], MD  Pseudoephedrine HCl (SUDAFED CONGESTION PO) Take by mouth.    [provider]    No Known Allergies  Patient Active Problem List   Diagnosis Date Noted  . OSA on CPAP 03/11/2017  . Obesity 07/10/2015  . Anger 12/16/2013  . Hyperlipidemia   . Unspecified vitamin D deficiency   . Herpes simplex type II infection     Past Medical History:  Diagnosis Date  . Asperger's syndrome    symptoms of  . Herpes simplex type II infection   . Hyperlipidemia   . Malignant neoplasm of prostate ([place])   . Multiple lipomas   . Premature ejaculation    07/2004  . Pulmonary embolism ([place])    04/2009  . Unspecified vitamin D deficiency     Past Surgical History:  Procedure Laterality Date  . CATARACT EXTRACTION, BILATERAL    . hip repalcement     2012  . KNEE ARTHROSCOPY W/ MENISCAL REPAIR Left  12/2014  . robotic prostatectomy     05/21/2009  . SHOULDER OPEN ROTATOR CUFF REPAIR      Social History   Socioeconomic History  . Marital status: Married    Spouse name: [name]  . Number of children: 2  . Years of education: Not on file  . Highest education level: Not on file  Occupational History  . Not on file  Social Needs  . Financial resource strain: Not on file  . Food insecurity:    Worry: Not on file    Inability: Not on file  . Transportation needs:    Medical: Not on file    Non-medical: Not on file  Tobacco Use  . Smoking status: Current Some Day Smoker    Packs/day: 1.00    Types: Cigarettes    Last attempt to quit: 03/29/2002    Years since quitting: 16.0  . Smokeless tobacco: Never Used  . Tobacco comment: smokes cigar when playing golf  Substance and Sexual Activity  . Alcohol use: Yes    Alcohol/week: 12.0 - 14.0 standard drinks    Types: 12 - 14 Shots of liquor per week  . Drug use: No  . Sexual activity: Yes  Lifestyle  . Physical activity:    Days per week: Not on file    Minutes  per session: Not on file  . Stress: Not on file  Relationships  . Social connections:    Talks on phone: Not on file    Gets together: Not on file    Attends religious service: Not on file    Active member of club or organization: Not on file    Attends meetings of clubs or organizations: Not on file    Relationship status: Not on file  . Intimate partner violence:    Fear of current or ex partner: Not on file    Emotionally abused: Not on file    Physically abused: Not on file    Forced sexual activity: Not on file  Other Topics Concern  . Not on file  Social History Narrative  . Not on file    Family History  Problem Relation Age of Onset  . Heart disease Unknown   . Hyperlipidemia Unknown   . Diabetes Unknown        ?  . Diabetes Mother   . Hypertension Mother   . Rheum arthritis Mother   . Cancer Father   . Heart failure Father        "heart  trouble"     Review of Systems  Constitutional: Negative.  Negative for chills and fever.  HENT: Negative.  Negative for congestion, nosebleeds and sore throat.   Eyes: Negative.  Negative for blurred vision and double vision.  Respiratory: Positive for cough.   Cardiovascular: Negative.  Negative for chest pain and palpitations.  Gastrointestinal: Negative.  Negative for abdominal pain, diarrhea, nausea and vomiting.  Genitourinary: Negative.  Negative for dysuria.  Musculoskeletal: Negative for back pain, myalgias and neck pain.  Skin: Negative.  Negative for rash.  Neurological: Negative.  Negative for dizziness and headaches.  Endo/Heme/Allergies: Negative.   All other systems reviewed and are negative.   Vitals:   04/10/18 1043  BP: 125/80  Pulse: 94  Resp: 16  Temp: 99.7 F (37.6 C)  SpO2: 94%    Physical Exam Vitals signs reviewed.  Constitutional:      Appearance: Normal appearance.  HENT:     Head: Normocephalic and atraumatic.     Nose: Nose normal.     Mouth/Throat:     Mouth: Mucous membranes are moist.     Pharynx: Oropharynx is clear.  Eyes:     Extraocular Movements: Extraocular movements intact.     Conjunctiva/sclera: Conjunctivae normal.     Pupils: Pupils are equal, round, and reactive to light.  Neck:     Musculoskeletal: Normal range of motion and neck supple.  Cardiovascular:     Rate and Rhythm: Normal rate and regular rhythm.     Heart sounds: Normal heart sounds.  Pulmonary:     Effort: Pulmonary effort is normal.     Breath sounds: Normal breath sounds.  Musculoskeletal: Normal range of motion.  Lymphadenopathy:     Cervical: No cervical adenopathy.  Skin:    General: Skin is warm and dry.     Capillary Refill: Capillary refill takes less than 2 seconds.  Neurological:     General: No focal deficit present.     Mental Status: He is alert and oriented to person, place, and time.     Sensory: No sensory deficit.     Motor: No  weakness.  Psychiatric:        Mood and Affect: Mood normal.        Behavior: Behavior normal.    A total of  25 minutes was spent in the room with the patient, greater than 50% of which was in counseling/coordination of care regarding diagnosis, treatment, medications, and need for follow-up if no better or worse.   ASSESSMENT & PLAN: Scott [name] was seen today for cough.  Diagnoses and all orders for this visit:  Cough -     benzonatate (TESSALON) 200 MG capsule; Take 1 capsule (200 mg total) by mouth 2 (two) times daily as needed for cough. -     promethazine-dextromethorphan (PROMETHAZINE-DM) 6.25-15 MG/5ML syrup; Take 5 mLs by mouth 4 (four) times daily as needed for cough.  Lower respiratory infection -     doxycycline (VIBRA-TABS) 100 MG tablet; Take 1 tablet (100 mg total) by mouth 2 (two) times daily for 7 days.    Patient Instructions       If you have lab work done today you will be contacted with your lab results within the next 2 weeks.  If you have not heard from [place] then please contact us. The fastest way to get your results is to register for My Chart.   IF you received an x-ray today, you will receive an invoice from [facility]. Please contact [facility] at [phone] with questions or concerns regarding your invoice.   IF you received labwork today, you will receive an invoice from [place]. Please contact [facility] at [phone] with questions or concerns regarding your invoice.   Our billing staff will not be able to assist you with questions regarding bills from these companies.  You will be contacted with the lab results as soon as they are available. The fastest way to get your results is to activate your My Chart account. Instructions are located on the last page of this paperwork. If you have not heard from [place] regarding the results in 2 weeks, please contact this office.     Cough, Adult  A cough helps to clear your throat and  lungs. A cough may last only 2-3 weeks (acute), or it may last longer than 8 weeks (chronic). Many different things can cause a cough. A cough may be a sign of an illness or another medical condition. Follow these instructions at home:  Pay attention to any changes in your cough.  Take medicines only as told by your doctor. ? If you were prescribed an antibiotic medicine, take it as told by your doctor. Do not stop taking it even if you start to feel better. ? Talk with your doctor before you try using a cough medicine.  Drink enough fluid to keep your pee (urine) clear or pale yellow.  If the air is dry, use a cold steam vaporizer or humidifier in your home.  Stay away from things that make you cough at work or at home.  If your cough is worse at night, try using extra pillows to raise your head up higher while you sleep.  Do not smoke, and try not to be around smoke. If you need help quitting, ask your doctor.  Do not have caffeine.  Do not drink alcohol.  Rest as needed. Contact a doctor if:  You have new problems (symptoms).  You cough up yellow fluid (pus).  Your cough does not get better after 2-3 weeks, or your cough gets worse.  Medicine does not help your cough and you are not sleeping well.  You have pain that gets worse or pain that is not helped with medicine.  You have a fever.  You are losing  weight and you do not know why.  You have night sweats. Get help right away if:  You cough up blood.  You have trouble breathing.  Your heartbeat is very fast. This information is not intended to replace advice given to you by your health care provider. Make sure you discuss any questions you have with your health care provider. Document Released: 11/26/2010 Document Revised: 08/21/2015 Document Reviewed: 05/22/2014 Elsevier Interactive Patient Education  2019 [employer] Inc[employer]      Agustina Caroli, MD [facility]

## 2018-04-10 NOTE — Patient Instructions (Addendum)
     If you have lab work done today you will be contacted with your lab results within the next 2 weeks.  If you have not heard from us then please contact us. The fastest way to get your results is to register for My Chart.   IF you received an x-ray today, you will receive an invoice from [facility]. Please contact [facility] at [phone] with questions or concerns regarding your invoice.   IF you received labwork today, you will receive an invoice from [facility]. Please contact [facility] at [phone] with questions or concerns regarding your invoice.   Our billing staff will not be able to assist you with questions regarding bills from these companies.  You will be contacted with the lab results as soon as they are available. The fastest way to get your results is to activate your My Chart account. Instructions are located on the last page of this paperwork. If you have not heard from us regarding the results in 2 weeks, please contact this office.     Cough, Adult  A cough helps to clear your throat and lungs. A cough may last only 2-3 weeks (acute), or it may last longer than 8 weeks (chronic). Many different things can cause a cough. A cough may be a sign of an illness or another medical condition. Follow these instructions at home:  Pay attention to any changes in your cough.  Take medicines only as told by your doctor. ? If you were prescribed an antibiotic medicine, take it as told by your doctor. Do not stop taking it even if you start to feel better. ? Talk with your doctor before you try using a cough medicine.  Drink enough fluid to keep your pee (urine) clear or pale yellow.  If the air is dry, use a cold steam vaporizer or humidifier in your home.  Stay away from things that make you cough at work or at home.  If your cough is worse at night, try using extra pillows to raise your head up higher while you sleep.  Do not smoke, and try not to  be around smoke. If you need help quitting, ask your doctor.  Do not have caffeine.  Do not drink alcohol.  Rest as needed. Contact a doctor if:  You have new problems (symptoms).  You cough up yellow fluid (pus).  Your cough does not get better after 2-3 weeks, or your cough gets worse.  Medicine does not help your cough and you are not sleeping well.  You have pain that gets worse or pain that is not helped with medicine.  You have a fever.  You are losing weight and you do not know why.  You have night sweats. Get help right away if:  You cough up blood.  You have trouble breathing.  Your heartbeat is very fast. This information is not intended to replace advice given to you by your health care provider. Make sure you discuss any questions you have with your health care provider. Document Released: 11/26/2010 Document Revised: 08/21/2015 Document Reviewed: 05/22/2014 Elsevier Interactive Patient Education  2019 Elsevier Inc.  

## 2018-04-19 ENCOUNTER — Other Ambulatory Visit: Payer: Self-pay

## 2018-04-19 ENCOUNTER — Ambulatory Visit: Payer: BLUE CROSS/BLUE SHIELD | Admitting: Emergency Medicine

## 2018-04-19 ENCOUNTER — Encounter: Payer: Self-pay | Admitting: Emergency Medicine

## 2018-04-19 ENCOUNTER — Ambulatory Visit (INDEPENDENT_AMBULATORY_CARE_PROVIDER_SITE_OTHER): Payer: BLUE CROSS/BLUE SHIELD

## 2018-04-19 VITALS — BP 120/80 | HR 70 | Temp 98.6°F | Resp 16 | Ht 75.0 in | Wt 291.8 lb

## 2018-04-19 DIAGNOSIS — G4733 Obstructive sleep apnea (adult) (pediatric): Secondary | ICD-10-CM | POA: Diagnosis not present

## 2018-04-19 DIAGNOSIS — E785 Hyperlipidemia, unspecified: Secondary | ICD-10-CM

## 2018-04-19 DIAGNOSIS — R05 Cough: Secondary | ICD-10-CM

## 2018-04-19 DIAGNOSIS — Z9989 Dependence on other enabling machines and devices: Secondary | ICD-10-CM | POA: Diagnosis not present

## 2018-04-19 DIAGNOSIS — R059 Cough, unspecified: Secondary | ICD-10-CM

## 2018-04-19 MED ORDER — PREDNISONE 20 MG PO TABS: 40.0000 mg | ORAL_TABLET | Freq: Every day | ORAL | 0 refills | Status: AC

## 2018-04-19 MED ORDER — ROSUVASTATIN CALCIUM 40 MG PO TABS: 40.0000 mg | ORAL_TABLET | Freq: Every day | ORAL | 3 refills | Status: DC

## 2018-04-19 MED ORDER — PROMETHAZINE-DM 6.25-15 MG/5ML PO SYRP: 5.0000 mL | ORAL_SOLUTION | Freq: Four times a day (QID) | ORAL | 0 refills | Status: DC | PRN

## 2018-04-19 NOTE — Progress Notes (Signed)
Scott [name] 58 y.o.   Chief Complaint  Patient presents with  . Cough    per patient is not better    HISTORY OF PRESENT ILLNESS: This is a 58 y.o. male complaining of persistent cough since last Thanksgiving.  Seen by me on 04/10/2018 for the same and started on doxycycline, Tessalon, and Phenergan DM.  Feels about the same.  No significant difference.  No new symptoms.  Denies fever or chills.  Denies difficulty breathing or wheezing.  No history of COPD.  Ex smoker. Also has a history of dyslipidemia and is requesting refill of Crestor.  Has appointment with PCP next month. HPI   Prior to Admission medications   Medication Sig Start Date End Date Taking? Authorizing Provider  aspirin EC 81 MG tablet Take 81 mg by mouth daily.   Yes [provider]  benzonatate (TESSALON) 200 MG capsule Take 1 capsule (200 mg total) by mouth 2 (two) times daily as needed for cough. 04/10/18  Yes Armida Vickroy, Ines Bloomer, MD  promethazine-dextromethorphan (PROMETHAZINE-DM) 6.25-15 MG/5ML syrup Take 5 mLs by mouth 4 (four) times daily as needed for cough. 04/10/18  Yes Horald Pollen, MD  rosuvastatin (CRESTOR) 40 MG tablet TAKE 1 TABLET BY MOUTH DAILY 12/27/17  Yes [name], MD  sertraline (ZOLOFT) 100 MG tablet TAKE 1 TABLET BY MOUTH DAILY 12/27/17  Yes [name], MD  tadalafil (CIALIS) 5 MG tablet Take 1 tablet (5 mg total) by mouth daily as needed. 03/05/14  Yes Jaynee Eagles, PA-C  valACYclovir (VALTREX) 500 MG tablet Take 1 tablet (500 mg total) by mouth daily. Patient taking differently: Take 500 mg by mouth as needed.  06/17/16  Yes [name], MD  Pseudoephedrine HCl (SUDAFED CONGESTION PO) Take by mouth.    [provider]    No Known Allergies  Patient Active Problem List   Diagnosis Date Noted  . OSA on CPAP 03/11/2017  . Obesity 07/10/2015  . Anger 12/16/2013  . Hyperlipidemia   . Unspecified vitamin D deficiency   . Herpes simplex type  II infection     Past Medical History:  Diagnosis Date  . Asperger's syndrome    symptoms of  . Herpes simplex type II infection   . Hyperlipidemia   . Malignant neoplasm of prostate ([place])   . Multiple lipomas   . Premature ejaculation    07/2004  . Pulmonary embolism ([place])    04/2009  . Unspecified vitamin D deficiency     Past Surgical History:  Procedure Laterality Date  . CATARACT EXTRACTION, BILATERAL    . hip repalcement     2012  . KNEE ARTHROSCOPY W/ MENISCAL REPAIR Left 12/2014  . robotic prostatectomy     05/21/2009  . SHOULDER OPEN ROTATOR CUFF REPAIR      Social History   Socioeconomic History  . Marital status: Married    Spouse name: [name]  . Number of children: 2  . Years of education: Not on file  . Highest education level: Not on file  Occupational History  . Not on file  Social Needs  . Financial resource strain: Not on file  . Food insecurity:    Worry: Not on file    Inability: Not on file  . Transportation needs:    Medical: Not on file    Non-medical: Not on file  Tobacco Use  . Smoking status: Current Some Day Smoker    Packs/day: 1.00    Types: Cigarettes  Last attempt to quit: 03/29/2002    Years since quitting: 16.0  . Smokeless tobacco: Never Used  . Tobacco comment: smokes cigar when playing golf  Substance and Sexual Activity  . Alcohol use: Yes    Alcohol/week: 12.0 - 14.0 standard drinks    Types: 12 - 14 Shots of liquor per week  . Drug use: No  . Sexual activity: Yes  Lifestyle  . Physical activity:    Days per week: Not on file    Minutes per session: Not on file  . Stress: Not on file  Relationships  . Social connections:    Talks on phone: Not on file    Gets together: Not on file    Attends religious service: Not on file    Active member of club or organization: Not on file    Attends meetings of clubs or organizations: Not on file    Relationship status: Not on file  . Intimate partner violence:    Fear  of current or ex partner: Not on file    Emotionally abused: Not on file    Physically abused: Not on file    Forced sexual activity: Not on file  Other Topics Concern  . Not on file  Social History Narrative  . Not on file    Family History  Problem Relation Age of Onset  . Heart disease Unknown   . Hyperlipidemia Unknown   . Diabetes Unknown        ?  . Diabetes Mother   . Hypertension Mother   . Rheum arthritis Mother   . Cancer Father   . Heart failure Father        "heart trouble"     Review of Systems  Constitutional: Negative.  Negative for chills and fever.  HENT: Negative.  Negative for congestion and nosebleeds.   Eyes: Negative.  Negative for blurred vision and double vision.  Respiratory: Positive for cough.   Cardiovascular: Negative.  Negative for chest pain and palpitations.  Gastrointestinal: Negative for abdominal pain, nausea and vomiting.  Genitourinary: Negative.  Negative for dysuria and hematuria.  Skin: Negative.  Negative for rash.  Neurological: Negative.  Negative for dizziness and headaches.  Endo/Heme/Allergies: Negative.   All other systems reviewed and are negative.   Vitals:   04/19/18 0837  BP: 120/80  Pulse: 70  Resp: 16  Temp: 98.6 F (37 C)  SpO2: 94%    Physical Exam Vitals signs reviewed.  Constitutional:      Appearance: Normal appearance.  HENT:     Head: Normocephalic and atraumatic.     Nose: Nose normal.     Mouth/Throat:     Mouth: Mucous membranes are moist.     Pharynx: Oropharynx is clear.  Eyes:     Extraocular Movements: Extraocular movements intact.     Conjunctiva/sclera: Conjunctivae normal.     Pupils: Pupils are equal, round, and reactive to light.  Neck:     Musculoskeletal: Normal range of motion and neck supple.  Cardiovascular:     Rate and Rhythm: Normal rate and regular rhythm.     Heart sounds: Normal heart sounds.  Pulmonary:     Effort: Pulmonary effort is normal.     Breath sounds:  Normal breath sounds.  Abdominal:     Palpations: Abdomen is soft.     Tenderness: There is no abdominal tenderness.  Musculoskeletal: Normal range of motion.  Skin:    General: Skin is warm and dry.  Capillary Refill: Capillary refill takes less than 2 seconds.  Neurological:     General: No focal deficit present.     Mental Status: He is alert and oriented to person, place, and time.  Psychiatric:        Mood and Affect: Mood normal.        Behavior: Behavior normal.    Dg Chest 2 View  Result Date: 04/19/2018 CLINICAL DATA:  Cough. EXAM: CHEST - 2 VIEW COMPARISON:  06/17/2016. FINDINGS: Trachea is midline. Heart size stable. Lungs are somewhat low in volume but clear. No pleural fluid. IMPRESSION: No acute findings. Electronically Signed   By: Lorin Picket M.D.   On: 04/19/2018 09:03     ASSESSMENT & PLAN: Therman was seen today for cough.  Diagnoses and all orders for this visit:  Cough -     DG Chest 2 View; Future -     predniSONE (DELTASONE) 20 MG tablet; Take 2 tablets (40 mg total) by mouth daily with breakfast for 5 days. -     promethazine-dextromethorphan (PROMETHAZINE-DM) 6.25-15 MG/5ML syrup; Take 5 mLs by mouth 4 (four) times daily as needed for cough.  OSA on CPAP  Hyperlipidemia, unspecified hyperlipidemia type -     rosuvastatin (CRESTOR) 40 MG tablet; Take 1 tablet (40 mg total) by mouth daily.    Patient Instructions       If you have lab work done today you will be contacted with your lab results within the next 2 weeks.  If you have not heard from [place] then please contact us. The fastest way to get your results is to register for My Chart.   IF you received an x-ray today, you will receive an invoice from [facility]. Please contact [facility] at [phone] with questions or concerns regarding your invoice.   IF you received labwork today, you will receive an invoice from [facility]. Please contact [facility] at [phone]  with questions or concerns regarding your invoice.   Our billing staff will not be able to assist you with questions regarding bills from these companies.  You will be contacted with the lab results as soon as they are available. The fastest way to get your results is to activate your My Chart account. Instructions are located on the last page of this paperwork. If you have not heard from [place] regarding the results in 2 weeks, please contact this office.     Cough, Adult  A cough helps to clear your throat and lungs. A cough may last only 2-3 weeks (acute), or it may last longer than 8 weeks (chronic). Many different things can cause a cough. A cough may be a sign of an illness or another medical condition. Follow these instructions at home:  Pay attention to any changes in your cough.  Take medicines only as told by your doctor. ? If you were prescribed an antibiotic medicine, take it as told by your doctor. Do not stop taking it even if you start to feel better. ? Talk with your doctor before you try using a cough medicine.  Drink enough fluid to keep your pee (urine) clear or pale yellow.  If the air is dry, use a cold steam vaporizer or humidifier in your home.  Stay away from things that make you cough at work or at home.  If your cough is worse at night, try using extra pillows to raise your head up higher while you sleep.  Do not smoke, and try not  to be around smoke. If you need help quitting, ask your doctor.  Do not have caffeine.  Do not drink alcohol.  Rest as needed. Contact a doctor if:  You have new problems (symptoms).  You cough up yellow fluid (pus).  Your cough does not get better after 2-3 weeks, or your cough gets worse.  Medicine does not help your cough and you are not sleeping well.  You have pain that gets worse or pain that is not helped with medicine.  You have a fever.  You are losing weight and you do not know why.  You have night  sweats. Get help right away if:  You cough up blood.  You have trouble breathing.  Your heartbeat is very fast. This information is not intended to replace advice given to you by your health care provider. Make sure you discuss any questions you have with your health care provider. Document Released: 11/26/2010 Document Revised: 08/21/2015 Document Reviewed: 05/22/2014 Elsevier Interactive Patient Education  2019 Elsevier Inc.      Agustina Caroli, MD [facility]

## 2018-04-19 NOTE — Patient Instructions (Addendum)
     If you have lab work done today you will be contacted with your lab results within the next 2 weeks.  If you have not heard from us then please contact us. The fastest way to get your results is to register for My Chart.   IF you received an x-ray today, you will receive an invoice from [facility]. Please contact [facility] at [phone] with questions or concerns regarding your invoice.   IF you received labwork today, you will receive an invoice from [facility]. Please contact [facility] at [phone] with questions or concerns regarding your invoice.   Our billing staff will not be able to assist you with questions regarding bills from these companies.  You will be contacted with the lab results as soon as they are available. The fastest way to get your results is to activate your My Chart account. Instructions are located on the last page of this paperwork. If you have not heard from us regarding the results in 2 weeks, please contact this office.     Cough, Adult  A cough helps to clear your throat and lungs. A cough may last only 2-3 weeks (acute), or it may last longer than 8 weeks (chronic). Many different things can cause a cough. A cough may be a sign of an illness or another medical condition. Follow these instructions at home:  Pay attention to any changes in your cough.  Take medicines only as told by your doctor. ? If you were prescribed an antibiotic medicine, take it as told by your doctor. Do not stop taking it even if you start to feel better. ? Talk with your doctor before you try using a cough medicine.  Drink enough fluid to keep your pee (urine) clear or pale yellow.  If the air is dry, use a cold steam vaporizer or humidifier in your home.  Stay away from things that make you cough at work or at home.  If your cough is worse at night, try using extra pillows to raise your head up higher while you sleep.  Do not smoke, and try not to  be around smoke. If you need help quitting, ask your doctor.  Do not have caffeine.  Do not drink alcohol.  Rest as needed. Contact a doctor if:  You have new problems (symptoms).  You cough up yellow fluid (pus).  Your cough does not get better after 2-3 weeks, or your cough gets worse.  Medicine does not help your cough and you are not sleeping well.  You have pain that gets worse or pain that is not helped with medicine.  You have a fever.  You are losing weight and you do not know why.  You have night sweats. Get help right away if:  You cough up blood.  You have trouble breathing.  Your heartbeat is very fast. This information is not intended to replace advice given to you by your health care provider. Make sure you discuss any questions you have with your health care provider. Document Released: 11/26/2010 Document Revised: 08/21/2015 Document Reviewed: 05/22/2014 Elsevier Interactive Patient Education  2019 Elsevier Inc.  

## 2018-05-15 ENCOUNTER — Ambulatory Visit: Payer: BLUE CROSS/BLUE SHIELD | Admitting: Family Medicine

## 2018-05-18 ENCOUNTER — Other Ambulatory Visit: Payer: Self-pay

## 2018-05-18 ENCOUNTER — Encounter: Payer: Self-pay | Admitting: Family Medicine

## 2018-05-18 ENCOUNTER — Ambulatory Visit (INDEPENDENT_AMBULATORY_CARE_PROVIDER_SITE_OTHER): Payer: BLUE CROSS/BLUE SHIELD | Admitting: Family Medicine

## 2018-05-18 VITALS — BP 140/80 | HR 67 | Temp 98.6°F | Ht 75.0 in | Wt 292.2 lb

## 2018-05-18 DIAGNOSIS — Z130 Encounter for screening for diseases of the blood and blood-forming organs and certain disorders involving the immune mechanism: Secondary | ICD-10-CM | POA: Diagnosis not present

## 2018-05-18 DIAGNOSIS — E559 Vitamin D deficiency, unspecified: Secondary | ICD-10-CM | POA: Diagnosis not present

## 2018-05-18 DIAGNOSIS — R5383 Other fatigue: Secondary | ICD-10-CM

## 2018-05-18 DIAGNOSIS — Z23 Encounter for immunization: Secondary | ICD-10-CM

## 2018-05-18 DIAGNOSIS — M79671 Pain in right foot: Secondary | ICD-10-CM | POA: Diagnosis not present

## 2018-05-18 DIAGNOSIS — Z1329 Encounter for screening for other suspected endocrine disorder: Secondary | ICD-10-CM

## 2018-05-18 DIAGNOSIS — Z0001 Encounter for general adult medical examination with abnormal findings: Secondary | ICD-10-CM | POA: Diagnosis not present

## 2018-05-18 DIAGNOSIS — F3289 Other specified depressive episodes: Secondary | ICD-10-CM

## 2018-05-18 DIAGNOSIS — Z13228 Encounter for screening for other metabolic disorders: Secondary | ICD-10-CM | POA: Diagnosis not present

## 2018-05-18 DIAGNOSIS — E785 Hyperlipidemia, unspecified: Secondary | ICD-10-CM | POA: Diagnosis not present

## 2018-05-18 DIAGNOSIS — Z0000 Encounter for general adult medical examination without abnormal findings: Secondary | ICD-10-CM

## 2018-05-18 MED ORDER — SERTRALINE HCL 100 MG PO TABS: ORAL_TABLET | ORAL | 3 refills | Status: DC

## 2018-05-18 MED ORDER — ROSUVASTATIN CALCIUM 40 MG PO TABS: 40.0000 mg | ORAL_TABLET | Freq: Every day | ORAL | 3 refills | Status: DC

## 2018-05-18 MED ORDER — ZOSTER VAC RECOMB ADJUVANTED 50 MCG/0.5ML IM SUSR: 0.5000 mL | Freq: Once | INTRAMUSCULAR | 1 refills | Status: AC

## 2018-05-18 NOTE — Progress Notes (Signed)
Subjective:    Patient ID: Scott [name], male    DOB: 1961/01/15, 58 y.o.   MRN: [id number]  HPI Scott [name] is a 58 y.o. male Presents today for: Chief Complaint  Patient presents with  . Annual Exam  . Foot Pain    going on a month on right heel. 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  Treated by [facility] in [place], office visit January 8   Depression: Treated with Zoloft.  Improved in the past with improved sleep and decreased alcohol. Discussed concentration in past. He feels like it has improved with use of CPAP.  Wife sent note with concerns - " slight short-term memory loss forgetting whole conversations (pateint notes form having drinks) from the night before, excessive sleeping (sleeping in till 8:30, possible drinking night before). occasional low energy weight gain immune system concerns with fever chills and body aches and pains Sunday afternoon and night (patient reports resolution of symptoms next day - feels fine currently)"   On zoloft 100mg  per day.  5-7 alcoholic drinks per week, less last month. Wife concerned about his drinking in past - 10-14 drinks per week prior. No DUI, no legal issues with alcohol. Denies addiction. No prior alcohol treatment.  Has voluntarily cut back on alcohol.  Occasional irritation with wife when she discusses his use  No guilt.  No eye opener.  Has not had marital counseling.  Less fatigue with use of CPAP.  On otc vit D supplement - would like to  have testing.  Less exercise since knee replacement   Wt Readings from Last 3 Encounters:  05/18/18 292 lb 3.2 oz (132.5 kg)  04/19/18 291 lb 12.8 oz (132.4 kg)  04/10/18 295 lb (133.8 kg)     Depression screen [facility] 2/9 05/18/2018 04/10/2018 11/08/2017 03/11/2017 03/11/2017    Decreased Interest 0 0 0 0 0  Down, Depressed, Hopeless 0 0 0 0 0  PHQ - 2 Score 0 0 0 0 0  Altered sleeping - - - 0 -  Tired, decreased energy - - - 1 -  Change in appetite - - - 1 -  Feeling bad or failure about yourself  - - - 0 -  Trouble concentrating - - - 0 -  Moving slowly or fidgety/restless - - - 0 -  Suicidal thoughts - - - 0 -  PHQ-9 Score - - - 2 -  Difficult doing work/chores - - - Somewhat difficult -    OSA on CPAP: Appointment with sleep specialist in August 2019.  100% compliance.  Residual AHI 2.3.  ESS 3.  Hyperlipidemia:  Lab Results  Component Value Date   CHOL 292 (H) 12/21/2016   HDL 51 12/21/2016   LDLCALC 217 (H) 12/21/2016   TRIG 122 12/21/2016   CHOLHDL 5.7 (H) 12/21/2016   Lab Results  Component Value Date   ALT 13 12/21/2016   AST 22 12/21/2016   ALKPHOS 113 12/21/2016   BILITOT 0.5 12/21/2016  no new side effects with Crestor 40mg  qd.   Foot pain: R heel pain past 1 month. NKI. Thinks form new shoes.  Better out of those shoes past few days. Sore with first step, then stretches/eases off with walking.  Tx: ice.   Cancer screening: Colonoscopy - 04/13/10-  repeat 10 years.  History of prostate CA,  radical prostatectomy about 9 years ago, undetectable readings. Has upcoming appt with urology.    Immunization History  Administered Date(s) Administered  . Influenza-Unspecified 03/29/2010, 12/27/2013, 12/28/2015, 12/31/2016, 12/22/2017  . Td 10/03/2006  . Tdap 12/21/2016  shingles vaccine - agrees to have  - send to pharmacy.     Visual Acuity Screening   Right eye Left eye Both eyes  Without correction: 20/25 20/25 20/25   With correction:     appt with optho due soon. Within a year on last appt.   Dental: every 6 months.   Exercise - minimal - golf only. Walking at work [employer], meetings.   Patient Active Problem List   Diagnosis Date Noted  . Cough 04/19/2018  . OSA on CPAP 03/11/2017  . Obesity 07/10/2015  . Anger  12/16/2013  . Hyperlipidemia   . Unspecified vitamin D deficiency   . Herpes simplex type II infection    Past Medical History:  Diagnosis Date  . Asperger's syndrome    symptoms of  . Herpes simplex type II infection   . Hyperlipidemia   . Malignant neoplasm of prostate ([place])   . Multiple lipomas   . Premature ejaculation    07/2004  . Pulmonary embolism ([place])    04/2009  . Unspecified vitamin D deficiency    Past Surgical History:  Procedure Laterality Date  . CATARACT EXTRACTION, BILATERAL    . hip repalcement     2012  . KNEE ARTHROSCOPY W/ MENISCAL REPAIR Left 12/2014  . robotic prostatectomy     05/21/2009  . SHOULDER OPEN ROTATOR CUFF REPAIR     No Known Allergies Prior to Admission medications   Medication Sig Start Date End Date Taking? Authorizing Provider  aspirin EC 81 MG tablet Take 81 mg by mouth daily.   Yes [provider]  Cholecalciferol (VITAMIN D-1000 MAX ST) 25 MCG (1000 UT) tablet Take by mouth.   Yes [provider]  rosuvastatin (CRESTOR) 40 MG tablet Take 1 tablet (40 mg total) by mouth daily. 04/19/18  Yes Horald Pollen, MD  sertraline (ZOLOFT) 100 MG tablet TAKE 1 TABLET BY MOUTH DAILY 12/27/17  Yes [name], MD  tadalafil (CIALIS) 5 MG tablet Take 1 tablet (5 mg total) by mouth daily as needed. 03/05/14  Yes Jaynee Eagles, PA-C  valACYclovir (VALTREX) 500 MG tablet Take 1 tablet (500 mg total) by mouth daily. Patient taking differently: Take 500 mg by mouth as needed.  06/17/16  Yes [name], MD   Social History   Socioeconomic History  . Marital status: Married    Spouse name: Scott [name]  . Number of children: 2  . Years of education: Not on file  . Highest education level: Not on file  Occupational History  . Not on file  Social Needs  . Financial resource strain: Not on file  . Food insecurity:    Worry: Not on file    Inability: Not on file  . Transportation needs:    Medical: Not on file     Non-medical: Not on file  Tobacco Use  . Smoking status: Current Some Day Smoker    Packs/day: 1.00    Types: Cigarettes    Last attempt to quit: 03/29/2002    Years since quitting: 16.1  . Smokeless tobacco: Never Used  . Tobacco comment: smokes cigar when playing golf  Substance and Sexual Activity  . Alcohol use: Yes  Alcohol/week: 12.0 - 14.0 standard drinks    Types: 12 - 14 Shots of liquor per week  . Drug use: No  . Sexual activity: Yes  Lifestyle  . Physical activity:    Days per week: Not on file    Minutes per session: Not on file  . Stress: Not on file  Relationships  . Social connections:    Talks on phone: Not on file    Gets together: Not on file    Attends religious service: Not on file    Active member of club or organization: Not on file    Attends meetings of clubs or organizations: Not on file    Relationship status: Not on file  . Intimate partner violence:    Fear of current or ex partner: Not on file    Emotionally abused: Not on file    Physically abused: Not on file    Forced sexual activity: Not on file  Other Topics Concern  . Not on file  Social History Narrative  . Not on file    Review of Systems 13 point review of systems per patient health survey noted.  Negative other than as indicated above or in HPI.      Objective:   Physical Exam Vitals signs reviewed.  Constitutional:      Appearance: He is well-developed.  HENT:     Head: Normocephalic and atraumatic.     Right Ear: External ear normal.     Left Ear: External ear normal.  Eyes:     Conjunctiva/sclera: Conjunctivae normal.     Pupils: Pupils are equal, round, and reactive to light.  Neck:     Musculoskeletal: Normal range of motion and neck supple.     Thyroid: No thyromegaly.  Cardiovascular:     Rate and Rhythm: Normal rate and regular rhythm.     Heart sounds: Normal heart sounds.  Pulmonary:     Effort: Pulmonary effort is normal. No respiratory distress.      Breath sounds: Normal breath sounds. No wheezing.  Abdominal:     General: There is no distension.     Palpations: Abdomen is soft.     Tenderness: There is no abdominal tenderness.  Musculoskeletal: Normal range of motion.        General: No tenderness.     Right foot: Normal range of motion. No tenderness (Nontender, but describes previous area of tenderness at posterior calcaneus.  Negative squeeze test.  No soft tissue swelling.  Distal Achilles nontender) or bony tenderness.  Lymphadenopathy:     Cervical: No cervical adenopathy.  Skin:    General: Skin is warm and dry.  Neurological:     Mental Status: He is alert and oriented to person, place, and time.     Deep Tendon Reflexes: Reflexes are normal and symmetric.  Psychiatric:        Behavior: Behavior normal.    Vitals:   05/18/18 1037 05/18/18 1105  BP: (!) 153/85 140/80  Pulse: 67   Temp: 98.6 F (37 C)   TempSrc: Oral   SpO2: 93%   Weight: 292 lb 3.2 oz (132.5 kg)   Height: 6\' 3"  (1.905 m)    6CIT Screen 05/18/2018  What Year? 0 points  What month? 0 points  What time? 0 points  Count back from 20 0 points  Months in reverse 0 points  Repeat phrase 0 points  Total Score 0        Assessment & Plan:  CALIEB LICHTMAN is a 58 y.o. male Annual physical exam  - -anticipatory guidance as below in AVS, screening labs above. Health maintenance items as above in HPI discussed/recommended as applicable.   Hyperlipidemia, unspecified hyperlipidemia type - Plan: Lipid panel, Comprehensive metabolic panel, rosuvastatin (CRESTOR) 40 MG tablet  -  Stable, tolerating current regimen. Medications refilled. Labs pending as above.   Screening for endocrine, metabolic and immunity disorder - Plan: Vitamin D, 25-hydroxy Fatigue, unspecified type - Plan: Vitamin D, 25-hydroxy, CBC, TSH Screening, anemia, deficiency, iron Other depression - Plan: sertraline (ZOLOFT) 100 MG tablet  -Concerns presenting from note provided  by wife were reviewed.  Patient states he has improved from symptoms on Sunday, no further fevers, feels well.  Does admit to some of the issues during time of drinking more alcohol.  I suspect some component of alcohol abuse previously, but has cut back on intake.  Fatigue has overall improved with use of CPAP.  Will screen for anemia, vitamin D, TSH as above, but less likely cause.  Reports overall stable depression symptoms, will continue Zoloft same dose.  Recheck 6 weeks.  Option also discussed of meeting with both him and his wife to review specific concerns further.  Option of meeting with neurology if further memory concerns.  In office testing reassuring  Need for shingles vaccine - Plan: Zoster Vaccine Adjuvanted [facility]) injection sent to pharmacy  R Heel pain  -Improved, nontender on exam.  Based on location could have had some irritation from previous shoe wear, differential includes retrocalcaneal bursitis or insertional Achilles tendinosis but again reassuring exam at present.  RTC precautions if worsens.   Meds ordered this encounter  Medications  . Zoster Vaccine Adjuvanted [facility]) injection    Sig: Inject 0.5 mLs into the muscle once for 1 dose. Repeat in 2-6 months.    Dispense:  0.5 mL    Refill:  1  . rosuvastatin (CRESTOR) 40 MG tablet    Sig: Take 1 tablet (40 mg total) by mouth daily.    Dispense:  90 tablet    Refill:  3  . sertraline (ZOLOFT) 100 MG tablet    Sig: TAKE 1 TABLET BY MOUTH DAILY    Dispense:  90 tablet    Refill:  3   Patient Instructions    Cutting back on alcohol may help some of the concerns that were presented today. If any continued concerns with memory, would recommend meeting with neurologist.  Memory screening test was normal here today.  I did check a vitamin D test, anemia test, thyroid test and other electrolytes. Please follow up in next 6 weeks to review these symptoms further.   Avoid other shoes for now, but if foot pain is not  continuing to improve - return for recheck in next few weeks.   No med changes at this time.   Keeping you healthy  Get these tests  Blood pressure- Have your blood pressure checked once a year by your healthcare provider.  Normal blood pressure is 120/80  Weight- Have your body mass index (BMI) calculated to screen for obesity.  BMI is a measure of body fat based on height and weight. You can also calculate your own BMI at [email or web address].  Cholesterol- Have your cholesterol checked every year.  Diabetes- Have your blood sugar checked regularly if you have high blood pressure, high cholesterol, have a family history of diabetes or if you are overweight.  Screening for Colon Cancer- Colonoscopy starting at age 10.  Screening may begin sooner depending on your family history and other health conditions. Follow up colonoscopy as directed by your Gastroenterologist.  Screening for Prostate Cancer- Both blood work (PSA) and a rectal exam help screen for Prostate Cancer.  Screening begins at age 37 with African-American men and at age 83 with Caucasian men.  Screening may begin sooner depending on your family history.  Take these medicines  Aspirin- One aspirin daily can help prevent Heart disease and Stroke.  Flu shot- Every fall.  Tetanus- Every 10 years.  Zostavax- Once after the age of 59 to prevent Shingles.  Pneumonia shot- Once after the age of 30; if you are younger than 35, ask your healthcare provider if you need a Pneumonia shot.  Take these steps  Don't smoke- If you do smoke, talk to your doctor about quitting.  For tips on how to quit, go to www.smokefree.gov or call 1-800-QUIT-NOW.  Be physically active- Exercise 5 days a week for at least 30 minutes.  If you are not already physically active start slow and gradually work up to 30 minutes of moderate physical activity.  Examples of moderate activity include walking briskly, mowing the yard, dancing, swimming,  bicycling, etc.  Eat a healthy diet- Eat a variety of healthy food such as fruits, vegetables, low fat milk, low fat cheese, yogurt, lean meant, poultry, fish, beans, tofu, etc. For more information go to www.thenutritionsource.org  Drink alcohol in moderation- Limit alcohol intake to less than two drinks a day. Never drink and drive.  Dentist- Brush and floss twice daily; visit your dentist twice a year.  Depression- Your emotional health is as important as your physical health. If you're feeling down, or losing interest in things you would normally enjoy please talk to your healthcare provider.  Eye exam- Visit your eye doctor every year.  Safe sex- If you may be exposed to a sexually transmitted infection, use a condom.  Seat belts- Seat belts can save your life; always wear one.  Smoke/Carbon Monoxide detectors- These detectors need to be installed on the appropriate level of your home.  Replace batteries at least once a year.  Skin cancer- When out in the sun, cover up and use sunscreen 15 SPF or higher.  Violence- If anyone is threatening you, please tell your healthcare provider.  Living Will/ Health care power of attorney- Speak with your healthcare provider and family.  If you have lab work done today you will be contacted with your lab results within the next 2 weeks.  If you have not heard from [place] then please contact us. The fastest way to get your results is to register for My Chart.   IF you received an x-ray today, you will receive an invoice from [facility] Radiology. Please contact [facility] Radiology at [phone] with questions or concerns regarding your invoice.   IF you received labwork today, you will receive an invoice from [place]. Please contact [facility] at [phone] with questions or concerns regarding your invoice.   Our billing staff will not be able to assist you with questions regarding bills from these companies.  You will be contacted with the  lab results as soon as they are available. The fastest way to get your results is to activate your My Chart account. Instructions are located on the last page of this paperwork. If you have not heard from [place] regarding the results in 2 weeks, please contact this office.       Signed,   Merri Ray,  MD [facility] [place].  05/18/18 12:41 PM

## 2018-05-18 NOTE — Patient Instructions (Addendum)
Cutting back on alcohol may help some of the concerns that were presented today. If any continued concerns with memory, would recommend meeting with neurologist.  Memory screening test was normal here today.  I did check a vitamin D test, anemia test, thyroid test and other electrolytes. Please follow up in next 6 weeks to review these symptoms further.   Avoid other shoes for now, but if foot pain is not continuing to improve - return for recheck in next few weeks.   No med changes at this time.   Keeping you healthy  Get these tests  Blood pressure- Have your blood pressure checked once a year by your healthcare provider.  Normal blood pressure is 120/80  Weight- Have your body mass index (BMI) calculated to screen for obesity.  BMI is a measure of body fat based on height and weight. You can also calculate your own BMI at [email or web address].  Cholesterol- Have your cholesterol checked every year.  Diabetes- Have your blood sugar checked regularly if you have high blood pressure, high cholesterol, have a family history of diabetes or if you are overweight.  Screening for Colon Cancer- Colonoscopy starting at age 34.  Screening may begin sooner depending on your family history and other health conditions. Follow up colonoscopy as directed by your Gastroenterologist.  Screening for Prostate Cancer- Both blood work (PSA) and a rectal exam help screen for Prostate Cancer.  Screening begins at age 33 with African-American men and at age 29 with Caucasian men.  Screening may begin sooner depending on your family history.  Take these medicines  Aspirin- One aspirin daily can help prevent Heart disease and Stroke.  Flu shot- Every fall.  Tetanus- Every 10 years.  Zostavax- Once after the age of 53 to prevent Shingles.  Pneumonia shot- Once after the age of 34; if you are younger than 80, ask your healthcare provider if you need a Pneumonia shot.  Take these steps  Don't smoke-  If you do smoke, talk to your doctor about quitting.  For tips on how to quit, go to www.smokefree.gov or call 1-800-QUIT-NOW.  Be physically active- Exercise 5 days a week for at least 30 minutes.  If you are not already physically active start slow and gradually work up to 30 minutes of moderate physical activity.  Examples of moderate activity include walking briskly, mowing the yard, dancing, swimming, bicycling, etc.  Eat a healthy diet- Eat a variety of healthy food such as fruits, vegetables, low fat milk, low fat cheese, yogurt, lean meant, poultry, fish, beans, tofu, etc. For more information go to www.thenutritionsource.org  Drink alcohol in moderation- Limit alcohol intake to less than two drinks a day. Never drink and drive.  Dentist- Brush and floss twice daily; visit your dentist twice a year.  Depression- Your emotional health is as important as your physical health. If you're feeling down, or losing interest in things you would normally enjoy please talk to your healthcare provider.  Eye exam- Visit your eye doctor every year.  Safe sex- If you may be exposed to a sexually transmitted infection, use a condom.  Seat belts- Seat belts can save your life; always wear one.  Smoke/Carbon Monoxide detectors- These detectors need to be installed on the appropriate level of your home.  Replace batteries at least once a year.  Skin cancer- When out in the sun, cover up and use sunscreen 15 SPF or higher.  Violence- If anyone is threatening you, please tell your healthcare provider.  Living Will/ Health care power of attorney- Speak with your healthcare provider and family.  If you have lab work done today you will be contacted with your lab results within the next 2 weeks.  If you have not heard from [place] then please contact us. The fastest way to get your results is to register for My Chart.   IF you received an x-ray today, you will receive an invoice from [facility] Radiology.  Please contact [facility] Radiology at [phone] with questions or concerns regarding your invoice.   IF you received labwork today, you will receive an invoice from [place]. Please contact [facility] at [phone] with questions or concerns regarding your invoice.   Our billing staff will not be able to assist you with questions regarding bills from these companies.  You will be contacted with the lab results as soon as they are available. The fastest way to get your results is to activate your My Chart account. Instructions are located on the last page of this paperwork. If you have not heard from [place] regarding the results in 2 weeks, please contact this office.

## 2018-05-19 LAB — COMPREHENSIVE METABOLIC PANEL
ALT: 30 IU/L (ref 0–44)
AST: 33 IU/L (ref 0–40)
Albumin/Globulin Ratio: 1.8 (ref 1.2–2.2)
Albumin: 4.7 g/dL (ref 3.8–4.9)
Alkaline Phosphatase: 96 IU/L (ref 39–117)
BUN/Creatinine Ratio: 10 (ref 9–20)
BUN: 9 mg/dL (ref 6–24)
Bilirubin Total: 0.3 mg/dL (ref 0.0–1.2)
CO2: 22 mmol/L (ref 20–29)
Calcium: 9.5 mg/dL (ref 8.7–10.2)
Chloride: 100 mmol/L (ref 96–106)
Creatinine, Ser: 0.92 mg/dL (ref 0.76–1.27)
GFR calc Af Amer: 106 mL/min/{1.73_m2} (ref >59)
GFR calc non Af Amer: 91 mL/min/{1.73_m2} (ref >59)
Globulin, Total: 2.6 g/dL (ref 1.5–4.5)
Glucose: 92 mg/dL (ref 65–99)
Potassium: 4.7 mmol/L (ref 3.5–5.2)
Sodium: 140 mmol/L (ref 134–144)
Total Protein: 7.3 g/dL (ref 6.0–8.5)

## 2018-05-19 LAB — CBC
Hematocrit: 40.7 % (ref 37.5–51.0)
Hemoglobin: 14.1 g/dL (ref 13.0–17.7)
MCH: 32.3 pg (ref 26.6–33.0)
MCHC: 34.6 g/dL (ref 31.5–35.7)
MCV: 93 fL (ref 79–97)
Platelets: 184 10*3/uL (ref 150–450)
RBC: 4.37 x10E6/uL (ref 4.14–5.80)
RDW: 13.7 % (ref 11.6–15.4)
WBC: 4.8 10*3/uL (ref 3.4–10.8)

## 2018-05-19 LAB — LIPID PANEL
CHOL/HDL RATIO: 6.1 ratio — AB (ref 0.0–5.0)
Cholesterol, Total: 242 mg/dL — ABNORMAL HIGH (ref 100–199)
HDL: 40 mg/dL (ref >39)
LDL Calculated: 173 mg/dL — ABNORMAL HIGH (ref 0–99)
TRIGLYCERIDES: 145 mg/dL (ref 0–149)
VLDL Cholesterol Cal: 29 mg/dL (ref 5–40)

## 2018-05-19 LAB — VITAMIN D 25 HYDROXY (VIT D DEFICIENCY, FRACTURES): Vit D, 25-Hydroxy: 29.2 ng/mL — ABNORMAL LOW (ref 30.0–100.0)

## 2018-05-19 LAB — TSH: TSH: 1.36 u[IU]/mL (ref 0.450–4.500)

## 2018-05-26 DIAGNOSIS — Z8546 Personal history of malignant neoplasm of prostate: Secondary | ICD-10-CM | POA: Diagnosis not present

## 2018-06-02 DIAGNOSIS — N5201 Erectile dysfunction due to arterial insufficiency: Secondary | ICD-10-CM | POA: Diagnosis not present

## 2018-06-02 DIAGNOSIS — Z8546 Personal history of malignant neoplasm of prostate: Secondary | ICD-10-CM | POA: Diagnosis not present

## 2018-07-17 ENCOUNTER — Telehealth (INDEPENDENT_AMBULATORY_CARE_PROVIDER_SITE_OTHER): Payer: BLUE CROSS/BLUE SHIELD | Admitting: Family Medicine

## 2018-07-17 ENCOUNTER — Other Ambulatory Visit: Payer: Self-pay

## 2018-07-17 DIAGNOSIS — R634 Abnormal weight loss: Secondary | ICD-10-CM

## 2018-07-17 DIAGNOSIS — R413 Other amnesia: Secondary | ICD-10-CM

## 2018-07-17 DIAGNOSIS — G4733 Obstructive sleep apnea (adult) (pediatric): Secondary | ICD-10-CM

## 2018-07-17 DIAGNOSIS — E559 Vitamin D deficiency, unspecified: Secondary | ICD-10-CM

## 2018-07-17 DIAGNOSIS — Z7289 Other problems related to lifestyle: Secondary | ICD-10-CM

## 2018-07-17 DIAGNOSIS — Z789 Other specified health status: Secondary | ICD-10-CM

## 2018-07-17 DIAGNOSIS — Z9989 Dependence on other enabling machines and devices: Secondary | ICD-10-CM

## 2018-07-17 NOTE — Progress Notes (Signed)
CC-6 week f/u- patient last seen here 05/18/18 labs was also done during this time. Patient stated he is not having any issus at this time. He has been doing good. Have not been having any issues with memery. No changes to medication at last visit.

## 2018-07-17 NOTE — Patient Instructions (Addendum)
Congratulations on the weight loss! I would recommend meeting with nutritionist or medical weight loss specialist for monitoring with intermittent fasting approach to weight loss.  Phone number for Dr. Leafy Ro as below, but let me know if you would like to have a referral to nutritionist.  Continue 2000 units vitamin D per day.  We can recheck vitamin D levels in about 3 months.  I am glad to hear things are going better since cutting back on alcohol.  Keep up the good work.  As far as the memory symptoms, I will place a referral to neuropsychologist to evaluate for some other testing that may provide more information.  Let me know if you do not receive a call from them in the next few weeks.  Additionally if there are some concerns regarding the CPAP, I would run this by your sleep specialist.    Good talking to you today, let me know if there are questions from this visit.   Dennard Nip, MD Medical Weight Loss Management  . [phone]      If you have lab work done today you will be contacted with your lab results within the next 2 weeks.  If you have not heard from [place] then please contact us. The fastest way to get your results is to register for My Chart.   IF you received an x-ray today, you will receive an invoice from [facility] Radiology. Please contact [facility] Radiology at [phone] with questions or concerns regarding your invoice.   IF you received labwork today, you will receive an invoice from [place]. Please contact [facility] at [phone] with questions or concerns regarding your invoice.   Our billing staff will not be able to assist you with questions regarding bills from these companies.  You will be contacted with the lab results as soon as they are available. The fastest way to get your results is to activate your My Chart account. Instructions are located on the last page of this paperwork. If you have not heard from [place] regarding the results in 2 weeks,  please contact this office.

## 2018-07-17 NOTE — Progress Notes (Signed)
Virtual Visit via Video Note  I connected with Scott [name] on 07/17/18 at 4:23 PM by a video enabled telemedicine application and verified that I am speaking with the correct person using two identifiers.   I discussed the limitations, risks, security and privacy concerns of performing an evaluation and management service by telephone and the availability of in person appointments. I also discussed with the patient that there may be a patient responsible charge related to this service. The patient expressed understanding and agreed to proceed, consent obtained  Depression:  Depression screen [facility] 2/9 07/17/2018 05/18/2018 04/10/2018 11/08/2017 03/11/2017  Decreased Interest 0 0 0 0 0  Down, Depressed, Hopeless 0 0 0 0 0  PHQ - 2 Score 0 0 0 0 0  Altered sleeping - - - - 0  Tired, decreased energy - - - - 1  Change in appetite - - - - 1  Feeling bad or failure about yourself  - - - - 0  Trouble concentrating - - - - 0  Moving slowly or fidgety/restless - - - - 0  Suicidal thoughts - - - - 0  PHQ-9 Score - - - - 2  Difficult doing work/chores - - - - Somewhat difficult  see last visit with concerns identified by spouse  regarding fatigue, memory concerns, depression.  Thought to be associated with alcohol use, but had decreased alcohol from previous intake.    Wife on call: They have intermittent fasting since early February for weight loss. Her doctor [facility]) recommended intermittent fasting, so he has done this as well.  Has lost 24 pounds from max weight.   Vitamin D borderline, recommended 2000 units/day - prior on 1000 units per day.  On vit C and magnesium.   Hyperlipidemia:  Lab Results  Component Value Date   CHOL 242 (H) 05/18/2018   HDL 40 05/18/2018   LDLCALC 173 (H) 05/18/2018   TRIG 145 05/18/2018   CHOLHDL 6.1 (H) 05/18/2018   Lab Results  Component Value Date   ALT 30 05/18/2018   AST 33 05/18/2018   ALKPHOS 96 05/18/2018   BILITOT  0.3 05/18/2018  On Crestor 40 mg daily, plan for recheck in 6 months with watching diet exercise.  Alcohol: Has cut back on alcohol. Has helped with weight loss. 3- 4 beers last week, has cut back on liquor.  Mood symptoms have improved, but still some concerns about memory.   Wife still concerned about his short term memory after dinner. See prior visits.  Using CPAP - saw sleep specialist last February.  appt with Dr. Pamella Pert 02/2017. Plan for psychologist, but never heard about that appointment.     Results for orders placed or performed in visit on 05/18/18  Lipid panel  Result Value Ref Range   Cholesterol, Total 242 (H) 100 - 199 mg/dL   Triglycerides 145 0 - 149 mg/dL   HDL 40 >39 mg/dL   VLDL Cholesterol Cal 29 5 - 40 mg/dL   LDL Calculated 173 (H) 0 - 99 mg/dL   Chol/HDL Ratio 6.1 (H) 0.0 - 5.0 ratio  Comprehensive metabolic panel  Result Value Ref Range   Glucose 92 65 - 99 mg/dL   BUN 9 6 - 24 mg/dL   Creatinine, Ser 0.92 0.76 - 1.27 mg/dL   GFR calc non Af Amer 91 >59 mL/min/1.73   GFR calc Af Amer 106 >59 mL/min/1.73   BUN/Creatinine Ratio 10 9 - 20   Sodium 140  134 - 144 mmol/L   Potassium 4.7 3.5 - 5.2 mmol/L   Chloride 100 96 - 106 mmol/L   CO2 22 20 - 29 mmol/L   Calcium 9.5 8.7 - 10.2 mg/dL   Total Protein 7.3 6.0 - 8.5 g/dL   Albumin 4.7 3.8 - 4.9 g/dL   Globulin, Total 2.6 1.5 - 4.5 g/dL   Albumin/Globulin Ratio 1.8 1.2 - 2.2   Bilirubin Total 0.3 0.0 - 1.2 mg/dL   Alkaline Phosphatase 96 39 - 117 IU/L   AST 33 0 - 40 IU/L   ALT 30 0 - 44 IU/L  Vitamin D, 25-hydroxy  Result Value Ref Range   Vit D, 25-Hydroxy 29.2 (L) 30.0 - 100.0 ng/mL  CBC  Result Value Ref Range   WBC 4.8 3.4 - 10.8 x10E3/uL   RBC 4.37 4.14 - 5.80 x10E6/uL   Hemoglobin 14.1 13.0 - 17.7 g/dL   Hematocrit 40.7 37.5 - 51.0 %   MCV 93 79 - 97 fL   MCH 32.3 26.6 - 33.0 pg   MCHC 34.6 31.5 - 35.7 g/dL   RDW 13.7 11.6 - 15.4 %   Platelets 184 150 - 450 x10E3/uL  TSH  Result  Value Ref Range   TSH 1.360 0.450 - 4.500 uIU/mL    Patient Active Problem List   Diagnosis Date Noted  . Cough 04/19/2018  . OSA on CPAP 03/11/2017  . Obesity 07/10/2015  . Anger 12/16/2013  . Hyperlipidemia   . Unspecified vitamin D deficiency   . Herpes simplex type II infection    Past Medical History:  Diagnosis Date  . Asperger's syndrome    symptoms of  . Herpes simplex type II infection   . Hyperlipidemia   . Malignant neoplasm of prostate ([place])   . Multiple lipomas   . Premature ejaculation    07/2004  . Pulmonary embolism ([place])    04/2009  . Unspecified vitamin D deficiency    Past Surgical History:  Procedure Laterality Date  . CATARACT EXTRACTION, BILATERAL    . hip repalcement     2012  . KNEE ARTHROSCOPY W/ MENISCAL REPAIR Left 12/2014  . robotic prostatectomy     05/21/2009  . SHOULDER OPEN ROTATOR CUFF REPAIR     No Known Allergies Prior to Admission medications   Medication Sig Start Date End Date Taking? Authorizing Provider  aspirin EC 81 MG tablet Take 81 mg by mouth daily.   Yes [provider]  Cholecalciferol (VITAMIN D-1000 MAX ST) 25 MCG (1000 UT) tablet Take by mouth.   Yes [provider]  rosuvastatin (CRESTOR) 40 MG tablet Take 1 tablet (40 mg total) by mouth daily. 05/18/18  Yes [name], MD  sertraline (ZOLOFT) 100 MG tablet TAKE 1 TABLET BY MOUTH DAILY 05/18/18  Yes [name], MD  tadalafil (CIALIS) 5 MG tablet Take 1 tablet (5 mg total) by mouth daily as needed. 03/05/14  Yes Jaynee Eagles, PA-C  valACYclovir (VALTREX) 500 MG tablet Take 1 tablet (500 mg total) by mouth daily. Patient taking differently: Take 500 mg by mouth as needed.  06/17/16  Yes [name], MD   Social History   Socioeconomic History  . Marital status: Married    Spouse name: Scott [name]  . Number of children: 2  . Years of education: Not on file  . Highest education level: Not on file  Occupational History  . Not on file   Social Needs  . Financial resource strain: Not  on file  . Food insecurity:    Worry: Not on file    Inability: Not on file  . Transportation needs:    Medical: Not on file    Non-medical: Not on file  Tobacco Use  . Smoking status: Current Some Day Smoker    Packs/day: 1.00    Types: Cigarettes    Last attempt to quit: 03/29/2002    Years since quitting: 16.3  . Smokeless tobacco: Never Used  . Tobacco comment: smokes cigar when playing golf  Substance and Sexual Activity  . Alcohol use: Yes    Alcohol/week: 12.0 - 14.0 standard drinks    Types: 12 - 14 Shots of liquor per week  . Drug use: No  . Sexual activity: Yes  Lifestyle  . Physical activity:    Days per week: Not on file    Minutes per session: Not on file  . Stress: Not on file  Relationships  . Social connections:    Talks on phone: Not on file    Gets together: Not on file    Attends religious service: Not on file    Active member of club or organization: Not on file    Attends meetings of clubs or organizations: Not on file    Relationship status: Not on file  . Intimate partner violence:    Fear of current or ex partner: Not on file    Emotionally abused: Not on file    Physically abused: Not on file    Forced sexual activity: Not on file  Other Topics Concern  . Not on file  Social History Narrative  . Not on file    Observations/Objective: Appropriate responses by video, no distress.  History provided by both patient and spouse.  Assessment and Plan: Memory changes - Plan: Ambulatory referral to Psychology  -Although some of the symptoms have improved with decreased alcohol use, still having some concentration versus memory issues as above.    -We will refer to neuropsychologist to discuss further and possible testing.  History of symptoms of Asperger's per problem list may be contributory.  Vitamin D deficiency  -Expect improvement with higher dose of over-the-counter treatment.  Can check next  few months.  OSA on CPAP  -Continue CPAP, and advised if questions regarding OSA or treatment to involve his sleep specialist.  Loss of weight  -Has had intentional weight loss with intermittent fasting diet.  Commended on weight loss, but option of meeting with weight management specialist or nutritionist to make sure he is getting appropriate calories and nutrition with this approach.  Phone number provided.  Alcohol use  -Has cut back as above.  Commended on these efforts.  Follow Up Instructions:    Patient Instructions   Congratulations on the weight loss! I would recommend meeting with nutritionist or medical weight loss specialist for monitoring with intermittent fasting approach to weight loss.  Phone number for Dr. Leafy Ro as below, but let me know if you would like to have a referral to nutritionist.  Continue 2000 units vitamin D per day.  We can recheck vitamin D levels in about 3 months.  I am glad to hear things are going better since cutting back on alcohol.  Keep up the good work.  As far as the memory symptoms, I will place a referral to neuropsychologist to evaluate for some other testing that may provide more information.  Let me know if you do not receive a call from them in the next  few weeks.  Additionally if there are some concerns regarding the CPAP, I would run this by your sleep specialist.    Good talking to you today, let me know if there are questions from this visit.   Dennard Nip, MD Medical Weight Loss Management  . [phone]      If you have lab work done today you will be contacted with your lab results within the next 2 weeks.  If you have not heard from [place] then please contact us. The fastest way to get your results is to register for My Chart.   IF you received an x-ray today, you will receive an invoice from [facility]. Please contact [facility] at [phone] with questions or concerns regarding your invoice.   IF  you received labwork today, you will receive an invoice from [facility]. Please contact [facility] at [phone] with questions or concerns regarding your invoice.   Our billing staff will not be able to assist you with questions regarding bills from these companies.  You will be contacted with the lab results as soon as they are available. The fastest way to get your results is to activate your My Chart account. Instructions are located on the last page of this paperwork. If you have not heard from [place] regarding the results in 2 weeks, please contact this office.        I discussed the assessment and treatment plan with the patient. The patient was provided an opportunity to ask questions and all were answered. The patient agreed with the plan and demonstrated an understanding of the instructions.   The patient was advised to call back or seek an in-person evaluation if the symptoms worsen or if the condition fails to improve as anticipated.  I provided 32 minutes of non-face-to-face time during this encounter. All counseling.    [name], MD

## 2018-07-26 DIAGNOSIS — N5089 Other specified disorders of the male genital organs: Secondary | ICD-10-CM | POA: Diagnosis not present

## 2018-07-26 DIAGNOSIS — E559 Vitamin D deficiency, unspecified: Secondary | ICD-10-CM | POA: Diagnosis not present

## 2018-07-26 DIAGNOSIS — Z131 Encounter for screening for diabetes mellitus: Secondary | ICD-10-CM | POA: Diagnosis not present

## 2018-07-26 DIAGNOSIS — E60 Dietary zinc deficiency: Secondary | ICD-10-CM | POA: Diagnosis not present

## 2018-07-26 DIAGNOSIS — R5383 Other fatigue: Secondary | ICD-10-CM | POA: Diagnosis not present

## 2018-07-26 DIAGNOSIS — E785 Hyperlipidemia, unspecified: Secondary | ICD-10-CM | POA: Diagnosis not present

## 2018-07-26 DIAGNOSIS — E538 Deficiency of other specified B group vitamins: Secondary | ICD-10-CM | POA: Diagnosis not present

## 2018-08-15 DIAGNOSIS — Z03818 Encounter for observation for suspected exposure to other biological agents ruled out: Secondary | ICD-10-CM | POA: Diagnosis not present

## 2018-08-15 DIAGNOSIS — R05 Cough: Secondary | ICD-10-CM | POA: Diagnosis not present

## 2018-08-16 DIAGNOSIS — Z8546 Personal history of malignant neoplasm of prostate: Secondary | ICD-10-CM | POA: Diagnosis not present

## 2018-08-16 DIAGNOSIS — E785 Hyperlipidemia, unspecified: Secondary | ICD-10-CM | POA: Diagnosis not present

## 2018-08-16 DIAGNOSIS — N5089 Other specified disorders of the male genital organs: Secondary | ICD-10-CM | POA: Diagnosis not present

## 2018-08-16 DIAGNOSIS — E612 Magnesium deficiency: Secondary | ICD-10-CM | POA: Diagnosis not present

## 2018-11-07 ENCOUNTER — Encounter: Payer: Self-pay | Admitting: Psychology

## 2018-11-07 DIAGNOSIS — E559 Vitamin D deficiency, unspecified: Secondary | ICD-10-CM | POA: Diagnosis not present

## 2018-11-07 DIAGNOSIS — Z8546 Personal history of malignant neoplasm of prostate: Secondary | ICD-10-CM | POA: Diagnosis not present

## 2018-11-07 DIAGNOSIS — R5383 Other fatigue: Secondary | ICD-10-CM | POA: Diagnosis not present

## 2018-11-07 DIAGNOSIS — N5089 Other specified disorders of the male genital organs: Secondary | ICD-10-CM | POA: Diagnosis not present

## 2018-11-07 DIAGNOSIS — E039 Hypothyroidism, unspecified: Secondary | ICD-10-CM | POA: Diagnosis not present

## 2018-11-07 DIAGNOSIS — E538 Deficiency of other specified B group vitamins: Secondary | ICD-10-CM | POA: Diagnosis not present

## 2018-11-14 ENCOUNTER — Ambulatory Visit (INDEPENDENT_AMBULATORY_CARE_PROVIDER_SITE_OTHER): Payer: BC Managed Care – PPO | Admitting: Adult Health

## 2018-11-14 ENCOUNTER — Other Ambulatory Visit: Payer: Self-pay

## 2018-11-14 ENCOUNTER — Encounter: Payer: Self-pay | Admitting: Adult Health

## 2018-11-14 VITALS — BP 127/80 | HR 66 | Temp 97.3°F | Ht 75.0 in | Wt 281.8 lb

## 2018-11-14 DIAGNOSIS — Z9989 Dependence on other enabling machines and devices: Secondary | ICD-10-CM | POA: Diagnosis not present

## 2018-11-14 DIAGNOSIS — G4733 Obstructive sleep apnea (adult) (pediatric): Secondary | ICD-10-CM

## 2018-11-14 NOTE — Progress Notes (Signed)
PATIENT: Scott [name] DOB: April 12, 1960  REASON FOR VISIT: follow up HISTORY FROM: patient  HISTORY OF PRESENT ILLNESS: Today 11/14/18:  Scott [name] is a 58 year old male with a history of obstructive sleep apnea on CPAP.  He returns today for follow-up.  His CPAP download indicates that he uses machine 30 out of 30 days for compliance of 100%.  He uses machine greater than 4 hours each night.  On average he uses his machine 7 hours and 20 minutes.  His residual AHI is 1.3 on 9 cm of water with EPR 3.  His leak in the 95th percentile is 25 L/min.  Overall he reports that he is doing well.  He denies any new symptoms.  He returns today for an evaluation.  HISTORY 11/08/17:  Scott [name] is a 57 year old male with a history of obstructive sleep apnea on CPAP.  He returns today for follow-up.  His CPAP download indicates that he uses machine 30 out of 30 days for compliance of 100%.  On average he uses his machine 7 hours and 24 minutes.  His residual AHI is 2.3 on 9 cmH2O with EPR 3.  He is weak in the 95th percentile is 15.4 L/min.  He reports that the CPAP continues to work well for him.  His Epworth sleepiness score is 3.  He returns today for evaluation.  REVIEW OF SYSTEMS: Out of a complete 14 system review of symptoms, the patient complains only of the following symptoms, and all other reviewed systems are negative.  See HPI   ALLERGIES: No Known Allergies  HOME MEDICATIONS: Outpatient Medications Prior to Visit  Medication Sig Dispense Refill  . aspirin EC 81 MG tablet Take 81 mg by mouth daily.    . Cholecalciferol (VITAMIN D-1000 MAX ST) 25 MCG (1000 UT) tablet Take by mouth.    Marland Kitchen EC-RX Testosterone 0.2 % CREA Apply 1 application topically daily.    Marland Kitchen NATURE-THROID 65 MG tablet Take 65 mg by mouth daily.    . rosuvastatin (CRESTOR) 40 MG tablet Take 1 tablet (40 mg total) by mouth daily. 90 tablet 3  . sertraline (ZOLOFT) 100 MG tablet TAKE 1 TABLET BY MOUTH DAILY  90 tablet 3  . tadalafil (CIALIS) 5 MG tablet Take 1 tablet (5 mg total) by mouth daily as needed. 10 tablet 11  . valACYclovir (VALTREX) 500 MG tablet Take 1 tablet (500 mg total) by mouth daily. (Patient taking differently: Take 500 mg by mouth as needed. ) 30 tablet 11   No facility-administered medications prior to visit.     PAST MEDICAL HISTORY: Past Medical History:  Diagnosis Date  . Asperger's syndrome    symptoms of  . Herpes simplex type II infection   . Hyperlipidemia   . Malignant neoplasm of prostate ([place])   . Multiple lipomas   . Premature ejaculation    07/2004  . Pulmonary embolism ([place])    04/2009  . Unspecified vitamin D deficiency     PAST SURGICAL HISTORY: Past Surgical History:  Procedure Laterality Date  . CATARACT EXTRACTION, BILATERAL    . hip repalcement     2012  . KNEE ARTHROSCOPY W/ MENISCAL REPAIR Left 12/2014  . robotic prostatectomy     05/21/2009  . SHOULDER OPEN ROTATOR CUFF REPAIR      FAMILY HISTORY: Family History  Problem Relation Age of Onset  . Heart disease Unknown   . Hyperlipidemia Unknown   . Diabetes Unknown        ?  Marland Kitchen  Diabetes Mother   . Hypertension Mother   . Rheum arthritis Mother   . Cancer Father   . Heart failure Father        "heart trouble"    SOCIAL HISTORY: Social History   Socioeconomic History  . Marital status: Married    Spouse name: [name]  . Number of children: 2  . Years of education: Not on file  . Highest education level: Not on file  Occupational History  . Not on file  Social Needs  . Financial resource strain: Not on file  . Food insecurity    Worry: Not on file    Inability: Not on file  . Transportation needs    Medical: Not on file    Non-medical: Not on file  Tobacco Use  . Smoking status: Current Some Day Smoker    Packs/day: 1.00    Types: Cigarettes    Last attempt to quit: 03/29/2002    Years since quitting: 16.6  . Smokeless tobacco: Never Used  . Tobacco comment:  smokes cigar when playing golf  Substance and Sexual Activity  . Alcohol use: Yes    Alcohol/week: 12.0 - 14.0 standard drinks    Types: 12 - 14 Shots of liquor per week  . Drug use: No  . Sexual activity: Yes  Lifestyle  . Physical activity    Days per week: Not on file    Minutes per session: Not on file  . Stress: Not on file  Relationships  . Social Herbalist on phone: Not on file    Gets together: Not on file    Attends religious service: Not on file    Active member of club or organization: Not on file    Attends meetings of clubs or organizations: Not on file    Relationship status: Not on file  . Intimate partner violence    Fear of current or ex partner: Not on file    Emotionally abused: Not on file    Physically abused: Not on file    Forced sexual activity: Not on file  Other Topics Concern  . Not on file  Social History Narrative  . Not on file      PHYSICAL EXAM  Vitals:   11/14/18 0810  BP: 127/80  Pulse: 66  Temp: (!) 97.3 F (36.3 C)  TempSrc: Oral  Weight: 281 lb 12.8 oz (127.8 kg)  Height: 6\' 3"  (1.905 m)   Body mass index is 35.22 kg/m.  Generalized: Well developed, in no acute distress Chest: Lungs clear to auscultation bilaterally  Neurological examination  Mentation: Alert oriented to time, place, history taking. Follows all commands speech and language fluent Cranial nerve II-XII: Pupils were equal round reactive to light. Extraocular movements were full, visual field were full on confrontational test.  Head turning and shoulder shrug  were normal and symmetric. Motor: The motor testing reveals 5 over 5 strength of all 4 extremities. Good symmetric motor tone is noted throughout.  Sensory: Sensory testing is intact to soft touch on all 4 extremities. No evidence of extinction is noted.  Coordination: Cerebellar testing reveals good finger-nose-finger and heel-to-shin bilaterally.  Gait and station: Gait is normal.      DIAGNOSTIC DATA (LABS, IMAGING, TESTING) - I reviewed patient records, labs, notes, testing and imaging myself where available.  Lab Results  Component Value Date   WBC 4.8 05/18/2018   HGB 14.1 05/18/2018   HCT 40.7 05/18/2018   MCV 93  05/18/2018   PLT 184 05/18/2018      Component Value Date/Time   NA 140 05/18/2018 1231   K 4.7 05/18/2018 1231   CL 100 05/18/2018 1231   CO2 22 05/18/2018 1231   GLUCOSE 92 05/18/2018 1231   GLUCOSE 94 07/10/2015 1056   BUN 9 05/18/2018 1231   CREATININE 0.92 05/18/2018 1231   CREATININE 0.86 07/10/2015 1056   CALCIUM 9.5 05/18/2018 1231   PROT 7.3 05/18/2018 1231   ALBUMIN 4.7 05/18/2018 1231   AST 33 05/18/2018 1231   ALT 30 05/18/2018 1231   ALKPHOS 96 05/18/2018 1231   BILITOT 0.3 05/18/2018 1231   GFRNONAA 91 05/18/2018 1231   GFRNONAA >89 07/10/2015 1056   GFRAA 106 05/18/2018 1231   GFRAA >89 07/10/2015 1056   Lab Results  Component Value Date   CHOL 242 (H) 05/18/2018   HDL 40 05/18/2018   LDLCALC 173 (H) 05/18/2018   TRIG 145 05/18/2018   CHOLHDL 6.1 (H) 05/18/2018   No results found for: HGBA1C No results found for: VITAMINB12 Lab Results  Component Value Date   TSH 1.360 05/18/2018      ASSESSMENT AND PLAN 58 y.o. year old male  has a past medical history of Asperger's syndrome, Herpes simplex type II infection, Hyperlipidemia, Malignant neoplasm of prostate ([place]), Multiple lipomas, Premature ejaculation, Pulmonary embolism ([place]), and Unspecified vitamin D deficiency. here with:  1.  Obstructive sleep apnea on CPAP  The patient's CPAP download shows excellent compliance and good treatment of his apnea.  He is encouraged to continue using CPAP nightly and greater than 4 hours each night.  He is advised that if his symptoms worsen or he develops new symptoms he should let us know.  He will follow-up in 1 year or sooner if needed    I spent 15 minutes with the patient. 50% of this time was spent reviewing CPAP  download   Ward Givens, MSN, NP-C 11/14/2018, 8:23 AM [facility] [address] [place], [place] [address] [phone]

## 2018-11-14 NOTE — Patient Instructions (Addendum)
Continue using CPAP nightly and greater than 4 hours each night If your symptoms worsen or you develop new symptoms please let us know.   Please call [facility] at [phone], and press option 1 when prompted. Their customer service representatives will be glad to assist you. If they are unable to answer, please leave a message and they will call you back. Make sure to leave your name and return phone number.

## 2018-11-15 DIAGNOSIS — Z8546 Personal history of malignant neoplasm of prostate: Secondary | ICD-10-CM | POA: Diagnosis not present

## 2018-11-15 DIAGNOSIS — E785 Hyperlipidemia, unspecified: Secondary | ICD-10-CM | POA: Diagnosis not present

## 2018-11-15 DIAGNOSIS — E612 Magnesium deficiency: Secondary | ICD-10-CM | POA: Diagnosis not present

## 2018-11-15 DIAGNOSIS — N5089 Other specified disorders of the male genital organs: Secondary | ICD-10-CM | POA: Diagnosis not present

## 2018-12-19 ENCOUNTER — Ambulatory Visit: Payer: BC Managed Care – PPO | Admitting: Psychology

## 2018-12-19 ENCOUNTER — Other Ambulatory Visit: Payer: Self-pay

## 2018-12-19 ENCOUNTER — Ambulatory Visit (INDEPENDENT_AMBULATORY_CARE_PROVIDER_SITE_OTHER): Payer: BC Managed Care – PPO | Admitting: Psychology

## 2018-12-19 ENCOUNTER — Encounter: Payer: Self-pay | Admitting: Psychology

## 2018-12-19 DIAGNOSIS — R4189 Other symptoms and signs involving cognitive functions and awareness: Secondary | ICD-10-CM

## 2018-12-19 DIAGNOSIS — G4733 Obstructive sleep apnea (adult) (pediatric): Secondary | ICD-10-CM

## 2018-12-19 DIAGNOSIS — Z9989 Dependence on other enabling machines and devices: Secondary | ICD-10-CM

## 2018-12-19 NOTE — Progress Notes (Signed)
   Neuropsychology Note   Scott [name] completed 95 minutes of neuropsychological testing with technician, Milana Kidney, B.S., under the supervision of Dr. Christia Reading, Ph.D., licensed neuropsychologist. The patient did not appear overtly distressed by the testing session, per behavioral observation or via self-report to the technician. Rest breaks were offered.    In considering the patient's current level of functioning, level of presumed impairment, nature of symptoms, emotional and behavioral responses during the interview, level of literacy, and observed level of motivation/effort, a battery of tests was selected and communicated to the psychometrician.   Communication between the psychologist and technician was ongoing throughout the testing session and changes were made as deemed necessary based on patient performance on testing, technician observations and additional pertinent factors such as those listed above.   Scott [name] will return within approximately two weeks for an interactive feedback session with Dr. Melvyn Novas at which time his test performances, clinical impressions, and treatment recommendations will be reviewed in detail. The patient understands he can contact our office should he require our assistance before this time.   Full report to follow.  95 minutes were spent face-to-face with patient administering standardized tests and 15 minutes were spent scoring (technician). [CPT [id number], [id number]

## 2018-12-19 NOTE — Progress Notes (Signed)
NEUROPSYCHOLOGICAL EVALUATION [place]. [place] Department of Neurology  Reason for Referral:   Scott [name] is a 59 y.o. Caucasian male referred by Merri Ray, M.D., to characterize his current cognitive functioning and assist with diagnostic clarity and treatment planning in the context of subjective cognitive decline.  Assessment and Plan:   Clinical Impression(s): Overall, Scott [name]' pattern of performance is suggestive of neuropsychological functioning within normal limits. A relative weakness was exhibited across a single task assessing spatial relationships and manipulation. However, other assessments of visuospatial functioning were within normal limits. Likewise, performance across domains of processing speed, attention/concentration, executive functioning, receptive and expressive language, and learning and memory were within normal limits. Responses across mood-related questionnaires were also within normal limits.  Specific to memory, Scott [name] was able to learn novel verbal and visual information efficiently and retain this knowledge after lengthy delays. Overall, memory performance combined with intact performances across other areas of cognitive functioning is not suggestive of an underlying neurodegenerative condition affecting memory processes (e.g., Alzheimer's disease). Likewise, his current cognitive and behavioral profile is not suggestive of any other form of neurodegenerative illness.  Recommendations: Optimal control of vascular risk factors (including safe cardiovascular exercise and adherence to dietary recommendations) is encouraged. Likewise, continued compliance with his CPAP machine will also be important in preventing future cognitive decline.  Continued participation in activities which provide mental stimulation and social interaction is also recommended.   To address problems with fluctuating attention, he may wish to  consider:   -Avoiding external distractions when needing to concentrate   -Writing down complicated information and using checklists   -Attempting and completing one task at a time (i.e., no multi-tasking)   -Reducing the amount of information considered at one time  Review of Records:   Scott [name] was seen by Dr. Merri Ray (Family Medicine) on 07/17/2018 for follow-up of several concerns, including fatigue, memory concerns, and depression, largely thought to be due to alcohol use. Scott [name] reported cutting back on alcohol consumption, consuming 3-4 beers during the previous week. Mood symptoms were said to have improved; however, his wife still expressed concerns regarding short-term memory. Subsequently, Scott [name] was referred for a comprehensive neuropsychological evaluation to characterize his thinking abilities and to assist with diagnostic clarity and future treatment planning.  Scott [name] was most recently seen by [facility] Ward Givens, NP) on 11/14/2018 for follow-up of obstructive sleep apnea and CPAP treatment. At that time, his CPAP download indicated 100% compliance, with average use of over 7 hours per night. Overall, Scott [name] reported doing well and that sleep symptoms had improved with CPAP assistance.  No neuroimaging was available for review.  Past Medical History:  Diagnosis Date  . Asperger's syndrome    Never formally diagnosed - potential traits of  . Herpes simplex type II infection   . Hyperlipidemia   . Malignant neoplasm of prostate ([place])   . Multiple lipomas   . Premature ejaculation 07/2004  . Pulmonary embolism ([place]) 04/2009  . Unspecified vitamin D deficiency     Past Surgical History:  Procedure Laterality Date  . CATARACT EXTRACTION, BILATERAL    . Hip repalcement  2012  . KNEE ARTHROSCOPY W/ MENISCAL REPAIR Left 12/2014  . SHOULDER OPEN ROTATOR CUFF REPAIR    . XI ROBOTIC ASSISTED SIMPLE PROSTATECTOMY   05/21/2009    Family History  Problem Relation Age of Onset  . Heart disease Other   . Hyperlipidemia Other   .  Diabetes Other   . Diabetes Mother   . Hypertension Mother   . Rheum arthritis Mother   . Cancer Father   . Heart failure Father        "heart trouble"     Current Outpatient Medications:  .  aspirin EC 81 MG tablet, Take 81 mg by mouth daily., Disp: , Rfl:  .  Cholecalciferol (VITAMIN D-1000 MAX ST) 25 MCG (1000 UT) tablet, Take by mouth., Disp: , Rfl:  .  EC-RX Testosterone 0.2 % CREA, Apply 1 application topically daily., Disp: , Rfl:  .  NATURE-THROID 65 MG tablet, Take 65 mg by mouth daily., Disp: , Rfl:  .  rosuvastatin (CRESTOR) 40 MG tablet, Take 1 tablet (40 mg total) by mouth daily., Disp: 90 tablet, Rfl: 3 .  sertraline (ZOLOFT) 100 MG tablet, TAKE 1 TABLET BY MOUTH DAILY, Disp: 90 tablet, Rfl: 3 .  tadalafil (CIALIS) 5 MG tablet, Take 1 tablet (5 mg total) by mouth daily as needed., Disp: 10 tablet, Rfl: 11 .  valACYclovir (VALTREX) 500 MG tablet, Take 1 tablet (500 mg total) by mouth daily. (Patient taking differently: Take 500 mg by mouth as needed. ), Disp: 30 tablet, Rfl: 11  Clinical Interview:   Cognitive Symptoms: Decreased short-term memory: Largely denied. Scott [name] stated that his wife has concerns regarding his short-term memory, but that his are minimal. He did acknowledge some trouble remembering the names of unfamiliar people, but stated that this was longstanding in nature. Overall, he generally attributed memory lapses to lapses in attention.  Decreased long-term memory: Denied. Decreased attention/concentration: Denied. Increased ease of distractibility: Endorsed. Examples were generally specific to his work environment, where he is often required to multi-task and occasionally gets distracted when a new problem or task arises. He noted commonly having several unfinished projects that he is attempting to work on simultaneously.  Reduced  processing speed: Denied. Difficulties with executive functions: Endorsed. In addition to mild difficulties with multi-tasking described above, he noted some trouble with organization (specifically while at work), as well as using good judgment. The latter was said to be related to him "butting heads" with coworkers and occasionally sending emails or conversing in a manner which might be viewed as terse or "snarky."  Difficulties with emotion regulation: Largely denied. However, he did acknowledge feeling aggravated at work at times due to interactions with his coworkers.  Difficulties with receptive language: Denied. Difficulties with word finding: Endorsed. Difficulties were said to include known words and he commonly experiences a tip-of-the-tongue phenomenon.  Decreased visuoperceptual ability: Denied. However, he did describe some trouble with long distance depth perception (e.g., judging where his golf ball has landed while golfing).  Trajectory of deficits: Deficits were largely described as longstanding and stable in nature. However, he did acknowledge the potential that short-term memory and word finding difficulties have worsening over recent years to a very mild degree.   Difficulties completing ADLs: Denied.  Additional Medical History: History of traumatic brain injury/concussion: Unclear. He reported being involved in sports while in high school and receiving several blows to the head (e.g., hitting his head on the ground while playing basketball). He denied any known instances in which he lost consciousness or experienced persisting post-concussion symptoms.  History of stroke: Denied. History of seizure activity: Denied. History of known exposure to toxins: Denied. Symptoms of chronic pain: Denied. Experience of frequent headaches/migraines: Denied. Frequent instances of dizziness/vertigo: Largely denied. Occasional and brief symptoms of dizziness were said to occur if he  stands up  too quickly from a previously seated position. A history of falls was denied.  Sensory changes: Denied. He reported having cataract surgery several years ago and described his current vision as good overall. No other sensory changes/difficulties (e.g., hearing, taste, or smell) were reported. Balance/coordination difficulties: Denied. Other motor difficulties: Denied.  Other medical conditions: He reported previous considerations of Asperger's syndrome, possibly due to perceived difficulties with social interactions at times. However, he reported that this condition was never formally assessed or diagnosed. He reported that his doctor has prescribed him Zoloft to help with symptoms of this syndrome, which was said to be beneficial.  Sleep History: Estimated hours obtained each night: 7 hours. Difficulties falling asleep: Denied. Difficulties staying asleep: Largely denied since using his CPAP machine. He reported waking up occasionally to use the restroom.   Feels rested and refreshed upon awakening: Endorsed.  History of snoring: Endorsed. History of waking up gasping for air: Endorsed. Witnessed breath cessation while asleep: Endorsed. As described above, Mr. Tiet was previously diagnosed with obstructive sleep apnea. He uses his CPAP machine nightly with positive effect.   History of vivid dreaming: Denied. Excessive movement while asleep: Denied. Instances of acting out his dreams: Endorsed. He reported instances in which he will "fight" in his sleep, depending on dream content. However, these instances were said to be rare in frequency.   Psychiatric/Behavioral Health History: Depression: Denied. Mr. Hsieh described his mood as "pretty good" and denied prior mental health concerns or diagnoses relating to depression. Current or remote suicidal ideation, intent, or plan was also denied. Anxiety: Denied. Mania: Denied. Trauma History: Denied. Visual/auditory hallucinations:  Denied. Delusional thoughts: Denied. Mental health treatment: Endorsed. He alluded to prior participation in psychotherapy, but ultimately did not find it beneficial.  Tobacco: Denied outside of smoking a cigar while golfing.  Alcohol: Currently, Mr. Deasy reported consuming 2-4 beers per week. This represents a notable decline from previous consumption rates, which included 1-2 shots of liquor per weekday, with larger amounts on the weekends. He denied past difficulties surrounding alcohol abuse or dependence, as well as any legal or social difficulties caused by alcohol consumption.  Recreational drugs: Denied. Caffeine: Endorsed. He reported consuming 1 cup of coffee in the morning, as well as a rare soda throughout the week.   Academic/Vocational History: Highest level of educational attainment: 18 years. Mr. Vong has a Master's degree in business administration. He described himself as an average (B/C) student during his undergraduate studies, and an improved (A/B) student during graduate studies.  History of developmental delay: Denied. History of grade repetition: Denied. History of class failures: Denied. Enrollment in special education courses: Denied. Longstanding strengths/weaknesses: Math was described as a longstanding relative weakness. History of diagnosed specific learning disability: Denied. History of ADHD: Denied.  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   Evaluation Results:   Behavioral Observations: Mr. Ludeke was unaccompanied, arrived to his appointment on time, and was appropriately dressed and groomed. Observed gait and station were within normal limits. Gross motor functioning appeared intact upon informal observation and no abnormal movements (e.g., tremors) were noted. His affect was generally relaxed and positive, but did range appropriately given the subject being discussed during the clinical  interview or the task at hand during testing procedures. Spontaneous speech was fluent and word finding difficulties were not observed during the clinical interview or testing procedures. Sustained attention was appropriate throughout. Thought processes were coherent, organized, and normal in content.  Task engagement was adequate and he persisted well when challenged. Overall, Mr. Galaz was cooperative with the clinical interview and subsequent testing procedures.   Adequacy of Effort: The validity of neuropsychological testing is limited by the extent to which the individual being tested may be assumed to have exerted adequate effort during testing. Mr. Berkes expressed his intention to perform to the best of his abilities and exhibited adequate task engagement and persistence. Scores across stand-alone and embedded performance validity measures were within expectation. As such, the results of the current evaluation are believed to be a valid representation of Mr. Briar' current cognitive functioning.  Test Results: Mr. Beseda was fully oriented at the time of the current evaluation.  Intellectual abilities based upon educational and vocational attainment were estimated to be in the average range. Premorbid abilities were estimated to be within the above average range based upon a single-word reading test (TOPF).   Processing speed was within normal limits. Basic attention was well above average. More complex attention (e.g., working memory) was above average. Performance across tasks assessing executive functions (e.g., cognitive flexibility, response inhibition, nonverbal abstract reasoning, analytical problem solving) were within normal limits.  Assessed receptive language abilities were within normal limits. Likewise, Mr. Vanderzwaag did not exhibit any difficulties comprehending task instructions and answered all questions asked of him appropriately. Assessed expressive language (e.g.,  verbal fluency and confrontation naming) was within normal limits.     Assessed visuospatial/visuoconstructional abilities were within normal limits.    Learning (i.e., encoding) of novel verbal and visual information was within normal limits. Spontaneous delayed recall (i.e., retrieval) of previously learned information was commensurate with performance across initial learning trials. Retention rates were appropriate across memory measures. Performance across recognition tasks was appropriate, suggesting evidence for appropriate information consolidation.   Results of emotional screening instruments suggested that recent symptoms of generalized anxiety were in the none to slight range, while symptoms of depression were within normal limits. A screening instrument assessing recent sleep quality suggested the presence of minimal sleep dysfunction.  Tables of Scores:   Note: This summary of test scores accompanies the interpretive report and should not be considered in isolation without reference to the appropriate sections in the text. Descriptors are based on appropriate normative data and may be adjusted based on clinical judgment. The terms "impaired" and "within normal limits (WNL)" are used when a more specific level of functioning cannot be determined.       Effort Testing:   DESCRIPTOR       ACS Word Choice: --- --- Within Expectation  CVLT-III Forced Choice Recognition: --- --- Within Expectation  BVMT-R Retention Percentage: --- --- Within Expectation       Orientation:      Raw Score Percentile   NAB Orientation, Form 1 29/29 72 Average       Intellectual Functioning:           Standard Score Percentile   Test of Premorbid Functioning (TOPF): 115 84 Above Average       Memory:          Wechsler Memory Scale (WMS-IV):                       Raw Score (Scaled Score) Percentile     Logical Memory I 32/50 (13) 84 Above Average    Logical Memory II 26/50 (12) 75 Above Average     Logical Memory Recognition 27/30 >75 Above Average       [place]  Verbal Learning Test (CVLT-III), Standard Form: Raw Score (Scaled/Standard Score) Percentile     Total Trials 1-5 59/80 (114) 82 Above Average    List B 6/16 (11) 63 Average    Short-Delay Free Recall 11/16 (11) 63 Average    Short-Delay Cued Recall 13/16 (11) 63 Average    Long Delay Free Recall 12/16 (11) 63 Average    Long Delay Cued Recall 13/16 (11) 63 Average      Recognition Hits 16/16 (13) 84 Above Average      False Positive Errors 1 (11) 63 Average       Brief Visuospatial Memory Test (BVMT-R), Form 1: Raw Score (T Score) Percentile     Total Trials 1-3 29/36 (62) 88 Above Average    Delayed Recall 12/12 (67) 96 Well Above Average    Recognition Discrimination Index 6 >16 Within Normal Limits      Recognition Hits 6/6 >16 Within Normal Limits      False Positive Errors 0 >16 Within Normal Limits        Attention/Executive Function:          Trail Making Test (TMT): Raw Score (T Score) Percentile     Part A 25 secs.,  1 error (49) 46 Average    Part B 54 secs., 0 errors (54) 66 Average        Scaled Score Percentile   WAIS-IV Coding: 8 25 Average       NAB Attention Module, Form 1: T Score Percentile     Digits Forward 67 96 Well Above Average    Digits Backwards 57 75 Above Average       D-KEFS Color-Word Interference Test: Raw Score (Scaled Score) Percentile     Color Naming 28 secs. (11) 63 Average    Word Reading 25 secs. (9) 37 Average    Inhibition 58 secs. (11) 63 Average      Total Errors 3 errors (8) 25 Average    Inhibition/Switching 76 secs. (9) 37 Average      Total Errors 2 errors (10) 50 Average       D-KEFS Verbal Fluency Test: Raw Score (Scaled Score) Percentile     Letter Total Correct 58 (16) 98 Exceptionally High    Category Total Correct 49 (14) 91 Above Average    Category Switching Total Correct 17 (15) 95 Well Above Average    Category Switching Accuracy 16 (14) 91  Above Average      Total Set Loss Errors 1 (11) 63 Average      Total Repetition Errors 5 (8) 25 Average       D-KEFS 20 Questions Test: Scaled Score Percentile     Total Weighted Achievement Score 14 91 Above Average    Initial Abstraction Score 7 16 Below Average       [place] Card Sorting Test [facility]): Raw Score Percentile     Categories (trials) 3 (64) >16 Within Normal Limits    Total Errors 13 46 Average    Perseverative Errors 5 46 Average    Non-Perseverative Errors 8 25 Average    Failure to Maintain Set 1 --- ---       Language:          NAB Language Module, Form 1: T Score Percentile     Auditory Comprehension 54 66 Average    Naming 52 58 Average       Visuospatial/Visuoconstruction:          NAB Spatial Module, Form  1: T Score Percentile     Figure Drawing Copy 47 38 Average    Figure Drawing Immediate Recall 59 82 Above Average        Scaled Score Percentile   WAIS-IV Matrix Reasoning: 12 75 Above Average  WAIS-IV Visual Puzzles: 7 16 Below Average       Mood and Personality:      Raw Score Percentile   Beck Depression Inventory - II: 5 --- Within Normal Limits  PROMIS Anxiety Questionnaire: 8 --- None to Slight       Additional Questionnaires:      Raw Score Percentile   PROMIS Sleep Disturbance Questionnaire: 12 --- None to Slight   Informed Consent and Coding/Compliance:   Mr. Doeden was provided with a verbal description of the nature and purpose of the present neuropsychological evaluation. Also reviewed were the foreseeable risks and/or discomforts and benefits of the procedure, limits of confidentiality, and mandatory reporting requirements of this provider. The patient was given the opportunity to ask questions and receive answers about the evaluation. Oral consent to participate was provided by the patient.   This evaluation was conducted by Christia Reading, Ph.D., licensed clinical neuropsychologist. Mr. Reymond completed a 30-minute clinical  interview, billed as one unit [phone], and 110 minutes of cognitive testing, billed as one unit [phone] and three additional units [phone]. Psychometrist Milana Kidney, B.S., assisted Dr. Melvyn Novas with test administration and scoring procedures. As a separate and discrete service, Dr. Melvyn Novas spent a total of 180 minutes in interpretation and report writing, billed as one unit [id number] and two units [id number].

## 2018-12-20 ENCOUNTER — Ambulatory Visit: Payer: BC Managed Care – PPO

## 2018-12-20 DIAGNOSIS — Z23 Encounter for immunization: Secondary | ICD-10-CM

## 2018-12-26 ENCOUNTER — Other Ambulatory Visit: Payer: Self-pay

## 2018-12-26 ENCOUNTER — Encounter: Payer: Self-pay | Admitting: Psychology

## 2018-12-26 ENCOUNTER — Ambulatory Visit (INDEPENDENT_AMBULATORY_CARE_PROVIDER_SITE_OTHER): Payer: BC Managed Care – PPO | Admitting: Psychology

## 2018-12-26 DIAGNOSIS — Z9989 Dependence on other enabling machines and devices: Secondary | ICD-10-CM

## 2018-12-26 DIAGNOSIS — G4733 Obstructive sleep apnea (adult) (pediatric): Secondary | ICD-10-CM

## 2018-12-26 NOTE — Progress Notes (Signed)
   NEUROPSYCHOLOGICAL EVALUATION - [place]back [place]. [place] Department of Neurology  Reason for Referral:   Reyes Kaster Hendricksis a 58 y.o. Caucasian male referred by Merri Ray, M.D.,to characterize hiscurrent cognitive functioning and assist with diagnostic clarity and treatment planning in the context of subjective cognitive decline.  Feedback:   Mr. Schiesser completed a comprehensive neuropsychological evaluation on 12/19/2018. Briefly, results suggested neuropsychological functioning within normal limits. A relative weakness was exhibited across a single task assessing spatial relationships and manipulation. However, other assessments of visuospatial functioning were within normal limits. Specific to memory, Mr. Konefal was able to learn novel verbal and visual information efficiently and retain this knowledge after lengthy delays. Overall, memory performance combined with intact performances across other areas of cognitive functioning is not suggestive of an underlying neurodegenerative condition affecting memory processes (e.g., Alzheimer's disease). Likewise, his current cognitive and behavioral profile is not suggestive of any other form of neurodegenerative illness. Recommendations included continued management of various health conditions and regular use of his CPAP machine.  Mr. Kiehn was accompanied by his wife. Content of the current session focused on the results of the current evaluation. Despite results within normal limits, Mr. Spolar' wife expressed continued concerns regarding her perception that his cognitive and physical functioning notably decline in the evenings. We discussed the effect that fatigue and variable attention can have on day-to-day cognitive functioning. Mr. Winberry and his wife were given the opportunity to ask questions and all their questions were answered. He was also encouraged to reach out should additional questions  arise. A copy of his report was provided at the conclusion of the visit.      A total of 20 minutes were spent with Mr. Madrigal and his wife during the current feedback session.

## 2018-12-28 DIAGNOSIS — L814 Other melanin hyperpigmentation: Secondary | ICD-10-CM | POA: Diagnosis not present

## 2018-12-28 DIAGNOSIS — D229 Melanocytic nevi, unspecified: Secondary | ICD-10-CM | POA: Diagnosis not present

## 2018-12-28 DIAGNOSIS — D485 Neoplasm of uncertain behavior of skin: Secondary | ICD-10-CM | POA: Diagnosis not present

## 2018-12-28 DIAGNOSIS — L819 Disorder of pigmentation, unspecified: Secondary | ICD-10-CM | POA: Diagnosis not present

## 2018-12-28 DIAGNOSIS — L820 Inflamed seborrheic keratosis: Secondary | ICD-10-CM | POA: Diagnosis not present

## 2018-12-28 DIAGNOSIS — L821 Other seborrheic keratosis: Secondary | ICD-10-CM | POA: Diagnosis not present

## 2019-01-22 DIAGNOSIS — L814 Other melanin hyperpigmentation: Secondary | ICD-10-CM | POA: Diagnosis not present

## 2019-01-22 DIAGNOSIS — D485 Neoplasm of uncertain behavior of skin: Secondary | ICD-10-CM | POA: Diagnosis not present

## 2019-01-22 DIAGNOSIS — L0291 Cutaneous abscess, unspecified: Secondary | ICD-10-CM | POA: Diagnosis not present

## 2019-02-02 DIAGNOSIS — E538 Deficiency of other specified B group vitamins: Secondary | ICD-10-CM | POA: Diagnosis not present

## 2019-02-02 DIAGNOSIS — R5383 Other fatigue: Secondary | ICD-10-CM | POA: Diagnosis not present

## 2019-02-02 DIAGNOSIS — N5089 Other specified disorders of the male genital organs: Secondary | ICD-10-CM | POA: Diagnosis not present

## 2019-02-02 DIAGNOSIS — E559 Vitamin D deficiency, unspecified: Secondary | ICD-10-CM | POA: Diagnosis not present

## 2019-02-02 DIAGNOSIS — E039 Hypothyroidism, unspecified: Secondary | ICD-10-CM | POA: Diagnosis not present

## 2019-02-15 DIAGNOSIS — R5383 Other fatigue: Secondary | ICD-10-CM | POA: Diagnosis not present

## 2019-02-15 DIAGNOSIS — Z8546 Personal history of malignant neoplasm of prostate: Secondary | ICD-10-CM | POA: Diagnosis not present

## 2019-02-15 DIAGNOSIS — E785 Hyperlipidemia, unspecified: Secondary | ICD-10-CM | POA: Diagnosis not present

## 2019-02-15 DIAGNOSIS — E039 Hypothyroidism, unspecified: Secondary | ICD-10-CM | POA: Diagnosis not present

## 2019-04-05 DIAGNOSIS — G4733 Obstructive sleep apnea (adult) (pediatric): Secondary | ICD-10-CM | POA: Diagnosis not present

## 2019-05-25 DIAGNOSIS — Z8546 Personal history of malignant neoplasm of prostate: Secondary | ICD-10-CM | POA: Diagnosis not present

## 2019-06-01 DIAGNOSIS — N5201 Erectile dysfunction due to arterial insufficiency: Secondary | ICD-10-CM | POA: Diagnosis not present

## 2019-06-01 DIAGNOSIS — Z8546 Personal history of malignant neoplasm of prostate: Secondary | ICD-10-CM | POA: Diagnosis not present

## 2019-06-07 DIAGNOSIS — M20012 Mallet finger of left finger(s): Secondary | ICD-10-CM | POA: Diagnosis not present

## 2019-06-14 DIAGNOSIS — M20012 Mallet finger of left finger(s): Secondary | ICD-10-CM | POA: Diagnosis not present

## 2019-06-26 DIAGNOSIS — M20012 Mallet finger of left finger(s): Secondary | ICD-10-CM | POA: Diagnosis not present

## 2019-07-05 DIAGNOSIS — M20012 Mallet finger of left finger(s): Secondary | ICD-10-CM | POA: Diagnosis not present

## 2019-07-10 DIAGNOSIS — M20012 Mallet finger of left finger(s): Secondary | ICD-10-CM | POA: Diagnosis not present

## 2019-07-24 DIAGNOSIS — M20012 Mallet finger of left finger(s): Secondary | ICD-10-CM | POA: Diagnosis not present

## 2019-08-03 DIAGNOSIS — G4733 Obstructive sleep apnea (adult) (pediatric): Secondary | ICD-10-CM | POA: Diagnosis not present

## 2019-08-07 DIAGNOSIS — M20012 Mallet finger of left finger(s): Secondary | ICD-10-CM | POA: Diagnosis not present

## 2019-08-14 DIAGNOSIS — L718 Other rosacea: Secondary | ICD-10-CM | POA: Diagnosis not present

## 2019-08-14 DIAGNOSIS — D225 Melanocytic nevi of trunk: Secondary | ICD-10-CM | POA: Diagnosis not present

## 2019-08-14 DIAGNOSIS — D1801 Hemangioma of skin and subcutaneous tissue: Secondary | ICD-10-CM | POA: Diagnosis not present

## 2019-08-16 DIAGNOSIS — M20012 Mallet finger of left finger(s): Secondary | ICD-10-CM | POA: Diagnosis not present

## 2019-08-21 DIAGNOSIS — M20012 Mallet finger of left finger(s): Secondary | ICD-10-CM | POA: Diagnosis not present

## 2019-08-28 DIAGNOSIS — M20012 Mallet finger of left finger(s): Secondary | ICD-10-CM | POA: Diagnosis not present

## 2019-09-18 DIAGNOSIS — M20012 Mallet finger of left finger(s): Secondary | ICD-10-CM | POA: Diagnosis not present

## 2019-10-09 DIAGNOSIS — M20012 Mallet finger of left finger(s): Secondary | ICD-10-CM | POA: Diagnosis not present

## 2019-11-05 DIAGNOSIS — G4733 Obstructive sleep apnea (adult) (pediatric): Secondary | ICD-10-CM | POA: Diagnosis not present

## 2019-11-06 DIAGNOSIS — M20012 Mallet finger of left finger(s): Secondary | ICD-10-CM | POA: Diagnosis not present

## 2019-11-15 ENCOUNTER — Ambulatory Visit: Payer: BC Managed Care – PPO | Admitting: Adult Health

## 2020-01-04 ENCOUNTER — Telehealth: Payer: Self-pay | Admitting: Family Medicine

## 2020-01-04 DIAGNOSIS — Z0000 Encounter for general adult medical examination without abnormal findings: Secondary | ICD-10-CM

## 2020-01-04 DIAGNOSIS — R5383 Other fatigue: Secondary | ICD-10-CM

## 2020-01-04 DIAGNOSIS — Z130 Encounter for screening for diseases of the blood and blood-forming organs and certain disorders involving the immune mechanism: Secondary | ICD-10-CM

## 2020-01-04 DIAGNOSIS — E785 Hyperlipidemia, unspecified: Secondary | ICD-10-CM

## 2020-01-04 NOTE — Telephone Encounter (Signed)
faxed

## 2020-01-04 NOTE — Telephone Encounter (Signed)
Pt called states his need the paper  [facility] for physical on nov Fax [phone] Please Advice

## 2020-01-16 ENCOUNTER — Other Ambulatory Visit: Payer: Self-pay

## 2020-01-16 DIAGNOSIS — E785 Hyperlipidemia, unspecified: Secondary | ICD-10-CM

## 2020-01-16 DIAGNOSIS — Z130 Encounter for screening for diseases of the blood and blood-forming organs and certain disorders involving the immune mechanism: Secondary | ICD-10-CM

## 2020-01-16 DIAGNOSIS — Z0000 Encounter for general adult medical examination without abnormal findings: Secondary | ICD-10-CM

## 2020-01-16 DIAGNOSIS — R5383 Other fatigue: Secondary | ICD-10-CM

## 2020-01-17 ENCOUNTER — Other Ambulatory Visit: Payer: BC Managed Care – PPO

## 2020-01-17 LAB — COMPREHENSIVE METABOLIC PANEL
ALT: 13 IU/L (ref 0–44)
AST: 16 IU/L (ref 0–40)
Albumin/Globulin Ratio: 2 (ref 1.2–2.2)
Albumin: 4.4 g/dL (ref 3.8–4.9)
Alkaline Phosphatase: 72 IU/L (ref 44–121)
BUN/Creatinine Ratio: 15 (ref 10–24)
BUN: 12 mg/dL (ref 8–27)
Bilirubin Total: 0.4 mg/dL (ref 0.0–1.2)
CO2: 24 mmol/L (ref 20–29)
Calcium: 9.3 mg/dL (ref 8.6–10.2)
Chloride: 102 mmol/L (ref 96–106)
Creatinine, Ser: 0.82 mg/dL (ref 0.76–1.27)
GFR calc Af Amer: 111 mL/min/{1.73_m2} (ref >59)
GFR calc non Af Amer: 96 mL/min/{1.73_m2} (ref >59)
Globulin, Total: 2.2 g/dL (ref 1.5–4.5)
Glucose: 100 mg/dL — ABNORMAL HIGH (ref 65–99)
Potassium: 4.6 mmol/L (ref 3.5–5.2)
Sodium: 137 mmol/L (ref 134–144)
Total Protein: 6.6 g/dL (ref 6.0–8.5)

## 2020-01-17 LAB — CBC WITH DIFFERENTIAL/PLATELET
Basophils Absolute: 0 10*3/uL (ref 0.0–0.2)
Basos: 0 %
EOS (ABSOLUTE): 0 10*3/uL (ref 0.0–0.4)
Eos: 1 %
Hematocrit: 43.5 % (ref 37.5–51.0)
Hemoglobin: 14.5 g/dL (ref 13.0–17.7)
Immature Grans (Abs): 0 10*3/uL (ref 0.0–0.1)
Immature Granulocytes: 0 %
Lymphocytes Absolute: 1.7 10*3/uL (ref 0.7–3.1)
Lymphs: 28 %
MCH: 32.7 pg (ref 26.6–33.0)
MCHC: 33.3 g/dL (ref 31.5–35.7)
MCV: 98 fL — ABNORMAL HIGH (ref 79–97)
Monocytes Absolute: 0.4 10*3/uL (ref 0.1–0.9)
Monocytes: 7 %
Neutrophils Absolute: 3.9 10*3/uL (ref 1.4–7.0)
Neutrophils: 64 %
Platelets: 151 10*3/uL (ref 150–450)
RBC: 4.44 x10E6/uL (ref 4.14–5.80)
RDW: 12.5 % (ref 11.6–15.4)
WBC: 6 10*3/uL (ref 3.4–10.8)

## 2020-01-17 LAB — HEMOGLOBIN A1C
Est. average glucose Bld gHb Est-mCnc: 108 mg/dL
Hgb A1c MFr Bld: 5.4 % (ref 4.8–5.6)

## 2020-01-17 LAB — LIPID PANEL
Chol/HDL Ratio: 8 ratio — ABNORMAL HIGH (ref 0.0–5.0)
Cholesterol, Total: 296 mg/dL — ABNORMAL HIGH (ref 100–199)
HDL: 37 mg/dL — ABNORMAL LOW (ref >39)
LDL Chol Calc (NIH): 230 mg/dL — ABNORMAL HIGH (ref 0–99)
Triglycerides: 152 mg/dL — ABNORMAL HIGH (ref 0–149)
VLDL Cholesterol Cal: 29 mg/dL (ref 5–40)

## 2020-02-03 NOTE — Progress Notes (Addendum)
PATIENT: Scott [name] DOB: December 26, 1960  REASON FOR VISIT: follow up HISTORY FROM: patient  HISTORY OF PRESENT ILLNESS: Today 02/03/20:  Scott [name] is a 59 year old male with a history of obstructive sleep apnea on CPAP.  His download indicates that he uses machine nightly for compliance of 100%.  He uses machine greater than 4 hours each night.  On average he uses his machine 7 hours and 22 minutes.  His residual AHI is 1.4 on 9 cm of water with EPR of 3.  Leak in the 95th percentile is 18.6 L/min.  Overall he feels that he is doing well.  He reports that sometimes he falls asleep in his chair.  He notes that when he wakes up sometimes he feels a little  confused and his wife has noticed his balance is off after the naps.  The patient states this is because his legs get stiff.  He does not use a CPAP when he naps.  He returns today for evaluation.  HISTORY 11/14/18:  Scott [name] is a 59 year old male with a history of obstructive sleep apnea on CPAP.  He returns today for follow-up.  His CPAP download indicates that he uses machine 30 out of 30 days for compliance of 100%.  He uses machine greater than 4 hours each night.  On average he uses his machine 7 hours and 20 minutes.  His residual AHI is 1.3 on 9 cm of water with EPR 3.  His leak in the 95th percentile is 25 L/min.  Overall he reports that he is doing well.  He denies any new symptoms.  He returns today for an evaluation.  REVIEW OF SYSTEMS: Out of a complete 14 system review of symptoms, the patient complains only of the following symptoms, and all other reviewed systems are negative.  FSS 19 ESS 3  ALLERGIES: No Known Allergies  HOME MEDICATIONS: Outpatient Medications Prior to Visit  Medication Sig Dispense Refill  . aspirin EC 81 MG tablet Take 81 mg by mouth daily.    . Cholecalciferol (VITAMIN D-1000 MAX ST) 25 MCG (1000 UT) tablet Take by mouth.    Marland Kitchen EC-RX Testosterone 0.2 % CREA Apply 1 application  topically daily.    Marland Kitchen NATURE-THROID 65 MG tablet Take 65 mg by mouth daily.    . rosuvastatin (CRESTOR) 40 MG tablet Take 1 tablet (40 mg total) by mouth daily. 90 tablet 3  . sertraline (ZOLOFT) 100 MG tablet TAKE 1 TABLET BY MOUTH DAILY 90 tablet 3  . tadalafil (CIALIS) 5 MG tablet Take 1 tablet (5 mg total) by mouth daily as needed. 10 tablet 11  . valACYclovir (VALTREX) 500 MG tablet Take 1 tablet (500 mg total) by mouth daily. (Patient taking differently: Take 500 mg by mouth as needed. ) 30 tablet 11   No facility-administered medications prior to visit.    PAST MEDICAL HISTORY: Past Medical History:  Diagnosis Date  . Asperger's syndrome    Never formally diagnosed - potential traits of  . Herpes simplex type II infection   . Hyperlipidemia   . Malignant neoplasm of prostate ([place])   . Multiple lipomas   . Premature ejaculation 07/2004  . Pulmonary embolism ([place]) 04/2009  . Unspecified vitamin D deficiency     PAST SURGICAL HISTORY: Past Surgical History:  Procedure Laterality Date  . CATARACT EXTRACTION, BILATERAL    . EYE SURGERY N/A    [facility] 02/01/2020  . Hip repalcement  2012  . JOINT REPLACEMENT N/A  [facility] 02/01/2020  . KNEE ARTHROSCOPY W/ MENISCAL REPAIR Left 12/2014  . PROSTATE SURGERY N/A    [facility] 02/01/2020  . SHOULDER OPEN ROTATOR CUFF REPAIR    . XI ROBOTIC ASSISTED SIMPLE PROSTATECTOMY  05/21/2009    FAMILY HISTORY: Family History  Problem Relation Age of Onset  . Heart disease Other   . Hyperlipidemia Other   . Diabetes Other   . Diabetes Mother   . Hypertension Mother   . Rheum arthritis Mother   . Cancer Father   . Heart failure Father        "heart trouble"    SOCIAL HISTORY: Social History   Socioeconomic History  . Marital status: Married    Spouse name: [name]  . Number of children: 2  . Years of education: 53  . Highest education level: Master's degree (e.g., MA, MS, MEng, MEd, MSW, MBA)  Occupational History  . Not  on file  Tobacco Use  . Smoking status: Light Tobacco Smoker    Last attempt to quit: 03/29/2002    Years since quitting: 17.8  . Smokeless tobacco: Never Used  . Tobacco comment: Smokes cigar when playing golf  Vaping Use  . Vaping Use: Never used  Substance and Sexual Activity  . Alcohol use: Yes    Alcohol/week: 2.0 - 4.0 standard drinks    Types: 2 - 4 Cans of beer per week  . Drug use: No  . Sexual activity: Yes  Other Topics Concern  . Not on file  Social History Narrative  . Not on file   Social Determinants of Health   Financial Resource Strain:   . Difficulty of Paying Living Expenses: Not on file  Food Insecurity:   . Worried About Charity fundraiser in the Last Year: Not on file  . Ran Out of Food in the Last Year: Not on file  Transportation Needs:   . Lack of Transportation (Medical): Not on file  . Lack of Transportation (Non-Medical): Not on file  Physical Activity:   . Days of Exercise per Week: Not on file  . Minutes of Exercise per Session: Not on file  Stress:   . Feeling of Stress : Not on file  Social Connections:   . Frequency of Communication with Friends and Family: Not on file  . Frequency of Social Gatherings with Friends and Family: Not on file  . Attends Religious Services: Not on file  . Active Member of Clubs or Organizations: Not on file  . Attends Archivist Meetings: Not on file  . Marital Status: Not on file  Intimate Partner Violence:   . Fear of Current or Ex-Partner: Not on file  . Emotionally Abused: Not on file  . Physically Abused: Not on file  . Sexually Abused: Not on file      PHYSICAL EXAM  Vitals:   02/04/20 0830  BP: 127/80  Pulse: 65  Weight: 272 lb 9.6 oz (123.7 kg)  Height: 6\' 3"  (1.905 m)   Body mass index is 34.07 kg/m.  Generalized: Well developed, in no acute distress  Chest: Lungs clear to auscultation bilaterally  Neurological examination  Mentation: Alert oriented to time, place,  history taking. Follows all commands speech and language fluent Cranial nerve II-XII: Extraocular movements were full, visual field were full on confrontational test Head turning and shoulder shrug  were normal and symmetric. Motor: The motor testing reveals 5 over 5 strength of all 4 extremities. Good symmetric motor tone is noted  throughout.  Sensory: Sensory testing is intact to soft touch on all 4 extremities. No evidence of extinction is noted.  Gait and station: Gait is normal.    DIAGNOSTIC DATA (LABS, IMAGING, TESTING) - I reviewed patient records, labs, notes, testing and imaging myself where available.  Lab Results  Component Value Date   WBC 6.0 01/16/2020   HGB 14.5 01/16/2020   HCT 43.5 01/16/2020   MCV 98 (H) 01/16/2020   PLT 151 01/16/2020      Component Value Date/Time   NA 137 01/16/2020 0818   K 4.6 01/16/2020 0818   CL 102 01/16/2020 0818   CO2 24 01/16/2020 0818   GLUCOSE 100 (H) 01/16/2020 0818   GLUCOSE 94 07/10/2015 1056   BUN 12 01/16/2020 0818   CREATININE 0.82 01/16/2020 0818   CREATININE 0.86 07/10/2015 1056   CALCIUM 9.3 01/16/2020 0818   PROT 6.6 01/16/2020 0818   ALBUMIN 4.4 01/16/2020 0818   AST 16 01/16/2020 0818   ALT 13 01/16/2020 0818   ALKPHOS 72 01/16/2020 0818   BILITOT 0.4 01/16/2020 0818   GFRNONAA 96 01/16/2020 0818   GFRNONAA >89 07/10/2015 1056   GFRAA 111 01/16/2020 0818   GFRAA >89 07/10/2015 1056   Lab Results  Component Value Date   CHOL 296 (H) 01/16/2020   HDL 37 (L) 01/16/2020   LDLCALC 230 (H) 01/16/2020   TRIG 152 (H) 01/16/2020   CHOLHDL 8.0 (H) 01/16/2020   Lab Results  Component Value Date   HGBA1C 5.4 01/16/2020   No results found for: [id number] Lab Results  Component Value Date   TSH 1.360 05/18/2018      ASSESSMENT AND PLAN 59 y.o. year old male  has a past medical history of Asperger's syndrome, Herpes simplex type II infection, Hyperlipidemia, Malignant neoplasm of prostate ([place]), Multiple  lipomas, Premature ejaculation (07/2004), Pulmonary embolism ([place]) (04/2009), and Unspecified vitamin D deficiency. here with:  1. OSA on CPAP  - CPAP compliance excellent - Good treatment of AHI  -Advised patient he can also use his CPAP during naps. - Encourage patient to use CPAP nightly and > 4 hours each night - F/U in 1 year or sooner if needed   I spent 25 minutes of face-to-face and non-face-to-face time with patient.  This included previsit chart review, lab review, study review, order entry, electronic health record documentation, patient education.  Ward Givens, MSN, NP-C 02/03/2020, 4:43 PM [facility] [address] [place], [place] [address] [phone]  I reviewed the above note and documentation by the Nurse Practitioner and agree with the history, exam, assessment and plan as outlined above. I was available for consultation. Star Age, MD, PhD [facility])

## 2020-02-04 ENCOUNTER — Ambulatory Visit (INDEPENDENT_AMBULATORY_CARE_PROVIDER_SITE_OTHER): Payer: BC Managed Care – PPO | Admitting: Family Medicine

## 2020-02-04 ENCOUNTER — Other Ambulatory Visit: Payer: Self-pay

## 2020-02-04 ENCOUNTER — Encounter: Payer: Self-pay | Admitting: Adult Health

## 2020-02-04 ENCOUNTER — Encounter: Payer: Self-pay | Admitting: Family Medicine

## 2020-02-04 ENCOUNTER — Ambulatory Visit: Payer: BC Managed Care – PPO | Admitting: Adult Health

## 2020-02-04 VITALS — BP 134/82 | HR 61 | Temp 98.0°F | Ht 75.0 in | Wt 271.4 lb

## 2020-02-04 VITALS — BP 127/80 | HR 65 | Ht 75.0 in | Wt 272.6 lb

## 2020-02-04 DIAGNOSIS — R7989 Other specified abnormal findings of blood chemistry: Secondary | ICD-10-CM

## 2020-02-04 DIAGNOSIS — G4733 Obstructive sleep apnea (adult) (pediatric): Secondary | ICD-10-CM | POA: Diagnosis not present

## 2020-02-04 DIAGNOSIS — Z9989 Dependence on other enabling machines and devices: Secondary | ICD-10-CM

## 2020-02-04 DIAGNOSIS — Z6833 Body mass index (BMI) 33.0-33.9, adult: Secondary | ICD-10-CM

## 2020-02-04 DIAGNOSIS — Z0000 Encounter for general adult medical examination without abnormal findings: Secondary | ICD-10-CM

## 2020-02-04 DIAGNOSIS — Z0001 Encounter for general adult medical examination with abnormal findings: Secondary | ICD-10-CM | POA: Diagnosis not present

## 2020-02-04 DIAGNOSIS — E785 Hyperlipidemia, unspecified: Secondary | ICD-10-CM

## 2020-02-04 DIAGNOSIS — Z1329 Encounter for screening for other suspected endocrine disorder: Secondary | ICD-10-CM | POA: Diagnosis not present

## 2020-02-04 DIAGNOSIS — N529 Male erectile dysfunction, unspecified: Secondary | ICD-10-CM | POA: Diagnosis not present

## 2020-02-04 DIAGNOSIS — M65311 Trigger thumb, right thumb: Secondary | ICD-10-CM

## 2020-02-04 DIAGNOSIS — Z23 Encounter for immunization: Secondary | ICD-10-CM | POA: Diagnosis not present

## 2020-02-04 DIAGNOSIS — H6121 Impacted cerumen, right ear: Secondary | ICD-10-CM

## 2020-02-04 DIAGNOSIS — E782 Mixed hyperlipidemia: Secondary | ICD-10-CM

## 2020-02-04 MED ORDER — TADALAFIL 5 MG PO TABS: 5.0000 mg | ORAL_TABLET | Freq: Every day | ORAL | 11 refills | Status: DC | PRN

## 2020-02-04 MED ORDER — ROSUVASTATIN CALCIUM 40 MG PO TABS: 40.0000 mg | ORAL_TABLET | Freq: Every day | ORAL | 3 refills | Status: DC

## 2020-02-04 NOTE — Progress Notes (Signed)
Subjective:  Patient ID: Scott [name], male    DOB: July 14, 1960  Age: 59 y.o. MRN: [id number]  CC:  Chief Complaint  Patient presents with  . Annual Exam    Pt reports as far as his general health he feels good. Pt did want to ask the provider about his R hand thumb and L hand index finger. Pt reports these fingers seem to cetch during movement and pt gets pain accross the top of the knuckles. pt reports this is worse in the mornings, but can happen all day.    HPI [name] presents for  Annual exam and finger issues above, med review.   OSA on CPAP, appointment today with sleep specialist. compliant with CPAP. Good treatment of AHI, CPAP during naps recommended.  1 year follow-up.  Feels rested during the day.   Depression: Treated with Zoloft 100 mg daily.  memory changes discussed in April.  Neurocognitive testing within normal limits in September 2020. Off zoloft (ran out) about a year ago. Feels ok. Mood doing ok.  Denies depressed mood/anhedonia.   Depression screen [facility] 2/9 02/04/2020 07/17/2018 05/18/2018 04/10/2018 11/08/2017  Decreased Interest 0 0 0 0 0  Down, Depressed, Hopeless 0 0 0 0 0  PHQ - 2 Score 0 0 0 0 0  Altered sleeping - - - - -  Tired, decreased energy - - - - -  Change in appetite - - - - -  Feeling bad or failure about yourself  - - - - -  Trouble concentrating - - - - -  Moving slowly or fidgety/restless - - - - -  Suicidal thoughts - - - - -  PHQ-9 Score - - - - -  Difficult doing work/chores - - - - -   Hyperlipidemia: Crestor 40mg  prior - off for about a year. No recent visit d/t pandemic.  No side effects on meds.  Lab Results  Component Value Date   CHOL 296 (H) 01/16/2020   HDL 37 (L) 01/16/2020   LDLCALC 230 (H) 01/16/2020   TRIG 152 (H) 01/16/2020   CHOLHDL 8.0 (H) 01/16/2020   Lab Results  Component Value Date   ALT 13 01/16/2020   AST 16 01/16/2020   ALKPHOS 72 01/16/2020   BILITOT 0.4 01/16/2020    Obesity: Body mass index is 33.92 kg/m. Trying intermittent fasting, trying better food choices.  Highest weight in January at 295- has lost weight since. 347-425 at home.  Goal in 230's Exercise: prior golf, walking, bike riding.  Under 150 min per week. Walking at work.  Wt Readings from Last 3 Encounters:  02/04/20 271 lb 6.4 oz (123.1 kg)  02/04/20 272 lb 9.6 oz (123.7 kg)  11/14/18 281 lb 12.8 oz (127.8 kg)   Vitamin D deficiency:  Prior on supplement - ?2000uints by [place], none in 4 months.   Also placed on nature thyroid, off for few months. Told thyroid was a little low? Or not as high as recommended.   Erectile dysfunction: cialis few days per week. Works well.  No hearing/vision changes, no chest pain.   R ear feels clogged: Past few months. Some drops otc - no relief.  No pain, or ringing.   R thumb: Catching, locking in R thumb and left index finger past 5-6 weeks. Intermittent, not persistent. Playing golf - no new activities. Index finger ok now, trigger this am. Saw Dr. Maggie Font in past.   Cancer screening: Colon - 04/13/10 Prostatectomy  10 years ago, has ongoing care prior with urology. Undetectable PSA - last visit earlier this year.   Immunization History  Administered Date(s) Administered  . Influenza,inj,Quad PF,6+ Mos 12/20/2018  . Influenza-Unspecified 03/29/2010, 12/27/2013, 12/28/2015, 12/31/2016, 12/22/2017, 12/12/2019  . Moderna SARS-COVID-2 Vaccination 06/08/2019, 07/06/2019  . Td 10/03/2006  . Tdap 12/21/2016  . Zoster Recombinat (Shingrix) 02/04/2020  plans on covid booster. Shingles: today   Dental: every 6 months.   History Patient Active Problem List   Diagnosis Date Noted  . OSA on CPAP 03/11/2017  . Obesity 07/10/2015  . Hyperlipidemia   . Unspecified vitamin D deficiency   . Herpes simplex type II infection    Past Medical History:  Diagnosis Date  . Asperger's syndrome    Never formally diagnosed -  potential traits of  . Herpes simplex type II infection   . Hyperlipidemia   . Malignant neoplasm of prostate ([place])   . Multiple lipomas   . Premature ejaculation 07/2004  . Pulmonary embolism ([place]) 04/2009  . Unspecified vitamin D deficiency    Past Surgical History:  Procedure Laterality Date  . CATARACT EXTRACTION, BILATERAL    . EYE SURGERY N/A    [facility]reesia 02/01/2020  . Hip repalcement  2012  . JOINT REPLACEMENT N/A    [facility]reesia 02/01/2020  . KNEE ARTHROSCOPY W/ MENISCAL REPAIR Left 12/2014  . PROSTATE SURGERY N/A    [facility]reesia 02/01/2020  . SHOULDER OPEN ROTATOR CUFF REPAIR    . XI ROBOTIC ASSISTED SIMPLE PROSTATECTOMY  05/21/2009   No Known Allergies Prior to Admission medications   Medication Sig Start Date End Date Taking? Authorizing Provider  aspirin EC 81 MG tablet Take 81 mg by mouth daily.   Yes [provider]  Cholecalciferol (VITAMIN D-1000 MAX ST) 25 MCG (1000 UT) tablet Take by mouth.   Yes [provider]  EC-RX Testosterone 0.2 % CREA Apply 1 application topically daily.   Yes [provider]  NATURE-THROID 65 MG tablet Take 65 mg by mouth daily. 08/30/18  Yes [provider]  rosuvastatin (CRESTOR) 40 MG tablet Take 1 tablet (40 mg total) by mouth daily. 05/18/18  Yes [name], MD  sertraline (ZOLOFT) 100 MG tablet TAKE 1 TABLET BY MOUTH DAILY 05/18/18  Yes [name], MD  tadalafil (CIALIS) 5 MG tablet Take 1 tablet (5 mg total) by mouth daily as needed. 03/05/14  Yes Jaynee Eagles, PA-C  valACYclovir (VALTREX) 500 MG tablet Take 1 tablet (500 mg total) by mouth daily. Patient taking differently: Take 500 mg by mouth as needed.  06/17/16  Yes [name], MD   Social History   Socioeconomic History  . Marital status: Married    Spouse name: [name]  . Number of children: 2  . Years of education: 48  . Highest education level: Master's degree (e.g., MA, MS, MEng, MEd, MSW, MBA)  Occupational History  . Not  on file  Tobacco Use  . Smoking status: Light Tobacco Smoker    Last attempt to quit: 03/29/2002    Years since quitting: 17.8  . Smokeless tobacco: Never Used  . Tobacco comment: Smokes cigar when playing golf  Vaping Use  . Vaping Use: Never used  Substance and Sexual Activity  . Alcohol use: Yes    Alcohol/week: 2.0 - 4.0 standard drinks    Types: 2 - 4 Cans of beer per week  . Drug use: No  . Sexual activity: Yes  Other Topics Concern  . Not  on file  Social History Narrative  . Not on file   Social Determinants of Health   Financial Resource Strain:   . Difficulty of Paying Living Expenses: Not on file  Food Insecurity:   . Worried About Charity fundraiser in the Last Year: Not on file  . Ran Out of Food in the Last Year: Not on file  Transportation Needs:   . Lack of Transportation (Medical): Not on file  . Lack of Transportation (Non-Medical): Not on file  Physical Activity:   . Days of Exercise per Week: Not on file  . Minutes of Exercise per Session: Not on file  Stress:   . Feeling of Stress : Not on file  Social Connections:   . Frequency of Communication with Friends and Family: Not on file  . Frequency of Social Gatherings with Friends and Family: Not on file  . Attends Religious Services: Not on file  . Active Member of Clubs or Organizations: Not on file  . Attends Archivist Meetings: Not on file  . Marital Status: Not on file  Intimate Partner Violence:   . Fear of Current or Ex-Partner: Not on file  . Emotionally Abused: Not on file  . Physically Abused: Not on file  . Sexually Abused: Not on file    Review of Systems 13 point review of systems per patient health survey noted.  Negative other than as indicated above or in HPI.    Objective:   Vitals:   02/04/20 0959  BP: 134/82  Pulse: 61  Temp: 98 F (36.7 C)  TempSrc: Temporal  SpO2: 97%  Weight: 271 lb 6.4 oz (123.1 kg)  Height: 6\' 3"  (1.905 m)     Physical Exam Vitals  reviewed.  Constitutional:      Appearance: He is well-developed.  HENT:     Head: Normocephalic and atraumatic.     Right Ear: External ear normal. There is impacted cerumen.     Left Ear: External ear normal.  Eyes:     Conjunctiva/sclera: Conjunctivae normal.     Pupils: Pupils are equal, round, and reactive to light.  Neck:     Thyroid: No thyromegaly.  Cardiovascular:     Rate and Rhythm: Normal rate and regular rhythm.     Heart sounds: Normal heart sounds.  Pulmonary:     Effort: Pulmonary effort is normal. No respiratory distress.     Breath sounds: Normal breath sounds. No wheezing.  Abdominal:     General: There is no distension.     Palpations: Abdomen is soft.     Tenderness: There is no abdominal tenderness.  Musculoskeletal:        General: No tenderness. Normal range of motion.     Cervical back: Normal range of motion and neck supple.     Comments: R thumb triggering at IP.  not painful.  Left hand - no triggering, nontender at index finger currently   Lymphadenopathy:     Cervical: No cervical adenopathy.  Skin:    General: Skin is warm and dry.  Neurological:     Mental Status: He is alert and oriented to person, place, and time.     Deep Tendon Reflexes: Reflexes are normal and symmetric.  Psychiatric:        Behavior: Behavior normal.        Assessment & Plan:  RIDWAN BONDY is a 59 y.o. male . Annual physical exam  - -anticipatory guidance as below in  AVS, screening labs above. Health maintenance items as above in HPI discussed/recommended as applicable.   Mixed hyperlipidemia - Plan: Lipid panel, Comprehensive metabolic panel Hyperlipidemia, unspecified hyperlipidemia type - Plan: rosuvastatin (CRESTOR) 40 MG tablet  -restart Crestor, check labs in 6 weeks.   Erectile dysfunction, unspecified erectile dysfunction type - Plan: tadalafil (CIALIS) 5 MG tablet  - cialis Rx given - use lowest effective dose. Side effects discussed (including  but not limited to headache/flushing, blue discoloration of vision, possible vascular steal and risk of cardiac effects if underlying unknown coronary artery disease, and permanent sensorineural hearing loss). Understanding expressed.  Screening for hypothyroidism - Plan: TSH  Low serum vitamin D - Plan: Vitamin D, 25-hydroxy  -Over-the-counter supplementation, check levels  Right ear impacted cerumen - Plan: Ear wax removal  -Resolved with lavage, no complications.  RTC precautions  Trigger thumb of right hand - Plan: Ambulatory referral to Hand Surgery  -Appears to have trigger thumb, referred to hand specialist for evaluation, can also evaluate left index finger but asymptomatic at present  BMI 33.0-33.9,adult  -Diet/exercise discussed for weight loss.  Commended on previous improvements.  Need for shingles vaccine - Plan: Varicella-zoster vaccine IM  -Initial injection given, second in 2 to 6 months.  Meds ordered this encounter  Medications  . rosuvastatin (CRESTOR) 40 MG tablet    Sig: Take 1 tablet (40 mg total) by mouth daily.    Dispense:  90 tablet    Refill:  3  . tadalafil (CIALIS) 5 MG tablet    Sig: Take 1 tablet (5 mg total) by mouth daily as needed.    Dispense:  10 tablet    Refill:  11   Patient Instructions   If return of depression symptoms, let me know and I can restart zoloft at lower dose initially.   Restart Crestor, then return for bloodwork in 6 weeks at a lab only visit (or through work)  Exercise 150 minutes per week. Keep up the good work with weight loss.   Over the counter vitamin D 1000 units per day for now. I will check this with upcoming bloodwork.   2nd shingles vaccine in 2-6 months.   I will refer you to hand specialist.   Keeping you healthy  Get these tests  Blood pressure- Have your blood pressure checked once a year by your healthcare provider.  Normal blood pressure is 120/80  Weight- Have your body mass index (BMI)  calculated to screen for obesity.  BMI is a measure of body fat based on height and weight. You can also calculate your own BMI at ViewBanking.si.  Cholesterol- Have your cholesterol checked every year.  Diabetes- Have your blood sugar checked regularly if you have high blood pressure, high cholesterol, have a family history of diabetes or if you are overweight.  Screening for Colon Cancer- Colonoscopy starting at age 37.  Screening may begin sooner depending on your family history and other health conditions. Follow up colonoscopy as directed by your Gastroenterologist.  Screening for Prostate Cancer- Both blood work (PSA) and a rectal exam help screen for Prostate Cancer.  Screening begins at age 45 with African-American men and at age 29 with Caucasian men.  Screening may begin sooner depending on your family history.  Take these medicines  Aspirin- One aspirin daily can help prevent Heart disease and Stroke.  Flu shot- Every fall.  Tetanus- Every 10 years.  Zostavax- Once after the age of 71 to prevent Shingles.  Pneumonia shot- Once  after the age of 60; if you are younger than 40, ask your healthcare provider if you need a Pneumonia shot.  Take these steps  Don't smoke- If you do smoke, talk to your doctor about quitting.  For tips on how to quit, go to www.smokefree.gov or call 1-800-QUIT-NOW.  Be physically active- Exercise 5 days a week for at least 30 minutes.  If you are not already physically active start slow and gradually work up to 30 minutes of moderate physical activity.  Examples of moderate activity include walking briskly, mowing the yard, dancing, swimming, bicycling, etc.  Eat a healthy diet- Eat a variety of healthy food such as fruits, vegetables, low fat milk, low fat cheese, yogurt, lean meant, poultry, fish, beans, tofu, etc. For more information go to www.thenutritionsource.org  Drink alcohol in moderation- Limit alcohol intake to less than two  drinks a day. Never drink and drive.  Dentist- Brush and floss twice daily; visit your dentist twice a year.  Depression- Your emotional health is as important as your physical health. If you're feeling down, or losing interest in things you would normally enjoy please talk to your healthcare provider.  Eye exam- Visit your eye doctor every year.  Safe sex- If you may be exposed to a sexually transmitted infection, use a condom.  Seat belts- Seat belts can save your life; always wear one.  Smoke/Carbon Monoxide detectors- These detectors need to be installed on the appropriate level of your home.  Replace batteries at least once a year.  Skin cancer- When out in the sun, cover up and use sunscreen 15 SPF or higher.  Violence- If anyone is threatening you, please tell your healthcare provider.  Living Will/ Health care power of attorney- Speak with your healthcare provider and family.  If you have lab work done today you will be contacted with your lab results within the next 2 weeks.  If you have not heard from [place] then please contact us. The fastest way to get your results is to register for My Chart.   IF you received an x-ray today, you will receive an invoice from [facility]. Please contact [facility] at [phone] with questions or concerns regarding your invoice.   IF you received labwork today, you will receive an invoice from [facility]. Please contact [facility] at [phone] with questions or concerns regarding your invoice.   Our billing staff will not be able to assist you with questions regarding bills from these companies.  You will be contacted with the lab results as soon as they are available. The fastest way to get your results is to activate your My Chart account. Instructions are located on the last page of this paperwork. If you have not heard from [place] regarding the results in 2 weeks, please contact this office.         Signed, Merri Ray, MD [facility]

## 2020-02-04 NOTE — Patient Instructions (Addendum)
If return of depression symptoms, let me know and I can restart zoloft at lower dose initially.   Restart Crestor, then return for bloodwork in 6 weeks at a lab only visit (or through work)  Exercise 150 minutes per week. Keep up the good work with weight loss.   Over the counter vitamin D 1000 units per day for now. I will check this with upcoming bloodwork.   2nd shingles vaccine in 2-6 months.   I will refer you to hand specialist.   Keeping you healthy  Get these tests  Blood pressure- Have your blood pressure checked once a year by your healthcare provider.  Normal blood pressure is 120/80  Weight- Have your body mass index (BMI) calculated to screen for obesity.  BMI is a measure of body fat based on height and weight. You can also calculate your own BMI at ViewBanking.si.  Cholesterol- Have your cholesterol checked every year.  Diabetes- Have your blood sugar checked regularly if you have high blood pressure, high cholesterol, have a family history of diabetes or if you are overweight.  Screening for Colon Cancer- Colonoscopy starting at age 47.  Screening may begin sooner depending on your family history and other health conditions. Follow up colonoscopy as directed by your Gastroenterologist.  Screening for Prostate Cancer- Both blood work (PSA) and a rectal exam help screen for Prostate Cancer.  Screening begins at age 39 with African-American men and at age 39 with Caucasian men.  Screening may begin sooner depending on your family history.  Take these medicines  Aspirin- One aspirin daily can help prevent Heart disease and Stroke.  Flu shot- Every fall.  Tetanus- Every 10 years.  Zostavax- Once after the age of 24 to prevent Shingles.  Pneumonia shot- Once after the age of 23; if you are younger than 43, ask your healthcare provider if you need a Pneumonia shot.  Take these steps  Don't smoke- If you do smoke, talk to your doctor about quitting.  For  tips on how to quit, go to www.smokefree.gov or call 1-800-QUIT-NOW.  Be physically active- Exercise 5 days a week for at least 30 minutes.  If you are not already physically active start slow and gradually work up to 30 minutes of moderate physical activity.  Examples of moderate activity include walking briskly, mowing the yard, dancing, swimming, bicycling, etc.  Eat a healthy diet- Eat a variety of healthy food such as fruits, vegetables, low fat milk, low fat cheese, yogurt, lean meant, poultry, fish, beans, tofu, etc. For more information go to www.thenutritionsource.org  Drink alcohol in moderation- Limit alcohol intake to less than two drinks a day. Never drink and drive.  Dentist- Brush and floss twice daily; visit your dentist twice a year.  Depression- Your emotional health is as important as your physical health. If you're feeling down, or losing interest in things you would normally enjoy please talk to your healthcare provider.  Eye exam- Visit your eye doctor every year.  Safe sex- If you may be exposed to a sexually transmitted infection, use a condom.  Seat belts- Seat belts can save your life; always wear one.  Smoke/Carbon Monoxide detectors- These detectors need to be installed on the appropriate level of your home.  Replace batteries at least once a year.  Skin cancer- When out in the sun, cover up and use sunscreen 15 SPF or higher.  Violence- If anyone is threatening you, please tell your healthcare provider.  Living Will/ Health care  power of attorney- Speak with your healthcare provider and family.  If you have lab work done today you will be contacted with your lab results within the next 2 weeks.  If you have not heard from [place] then please contact us. The fastest way to get your results is to register for My Chart.   IF you received an x-ray today, you will receive an invoice from [facility]. Please contact [facility] at [phone] with  questions or concerns regarding your invoice.   IF you received labwork today, you will receive an invoice from [place]. Please contact [facility] at [phone] with questions or concerns regarding your invoice.   Our billing staff will not be able to assist you with questions regarding bills from these companies.  You will be contacted with the lab results as soon as they are available. The fastest way to get your results is to activate your My Chart account. Instructions are located on the last page of this paperwork. If you have not heard from [place] regarding the results in 2 weeks, please contact this office.

## 2020-02-05 ENCOUNTER — Encounter: Payer: Self-pay | Admitting: Family Medicine

## 2020-02-10 ENCOUNTER — Encounter: Payer: Self-pay | Admitting: Family Medicine

## 2020-02-10 DIAGNOSIS — N529 Male erectile dysfunction, unspecified: Secondary | ICD-10-CM

## 2020-02-14 DIAGNOSIS — M65311 Trigger thumb, right thumb: Secondary | ICD-10-CM | POA: Diagnosis not present

## 2020-02-14 DIAGNOSIS — M65322 Trigger finger, left index finger: Secondary | ICD-10-CM | POA: Diagnosis not present

## 2020-02-25 MED ORDER — TADALAFIL 5 MG PO TABS: 5.0000 mg | ORAL_TABLET | Freq: Every day | ORAL | 11 refills | Status: DC | PRN

## 2020-02-25 NOTE — Telephone Encounter (Signed)
Pt sent another inquiry about why he wasn't able to have 30 tablets Viagra I am unaware for reasoning for or against if you could enlighten me.

## 2020-02-26 MED ORDER — TADALAFIL 5 MG PO TABS: 5.0000 mg | ORAL_TABLET | Freq: Every day | ORAL | 11 refills | Status: DC | PRN

## 2020-02-26 NOTE — Addendum Note (Signed)
Addended by: Patrcia Dolly on: 02/26/2020 05:37 PM   Modules accepted: Orders

## 2020-03-03 DIAGNOSIS — G4733 Obstructive sleep apnea (adult) (pediatric): Secondary | ICD-10-CM | POA: Diagnosis not present

## 2020-03-12 ENCOUNTER — Other Ambulatory Visit: Payer: Self-pay

## 2020-03-13 ENCOUNTER — Other Ambulatory Visit: Payer: Self-pay

## 2020-03-13 ENCOUNTER — Other Ambulatory Visit: Payer: BC Managed Care – PPO

## 2020-03-13 DIAGNOSIS — R7989 Other specified abnormal findings of blood chemistry: Secondary | ICD-10-CM

## 2020-03-13 DIAGNOSIS — Z1329 Encounter for screening for other suspected endocrine disorder: Secondary | ICD-10-CM

## 2020-03-13 DIAGNOSIS — E782 Mixed hyperlipidemia: Secondary | ICD-10-CM

## 2020-03-14 LAB — COMPREHENSIVE METABOLIC PANEL
ALT: 26 IU/L (ref 0–44)
AST: 25 IU/L (ref 0–40)
Albumin/Globulin Ratio: 1.7 (ref 1.2–2.2)
Albumin: 4.4 g/dL (ref 3.8–4.9)
Alkaline Phosphatase: 95 IU/L (ref 44–121)
BUN/Creatinine Ratio: 10 (ref 10–24)
BUN: 10 mg/dL (ref 8–27)
Bilirubin Total: 0.7 mg/dL (ref 0.0–1.2)
CO2: 26 mmol/L (ref 20–29)
Calcium: 9.4 mg/dL (ref 8.6–10.2)
Chloride: 101 mmol/L (ref 96–106)
Creatinine, Ser: 0.96 mg/dL (ref 0.76–1.27)
GFR calc Af Amer: 99 mL/min/{1.73_m2} (ref >59)
GFR calc non Af Amer: 86 mL/min/{1.73_m2} (ref >59)
Globulin, Total: 2.6 g/dL (ref 1.5–4.5)
Glucose: 108 mg/dL — ABNORMAL HIGH (ref 65–99)
Potassium: 4.5 mmol/L (ref 3.5–5.2)
Sodium: 140 mmol/L (ref 134–144)
Total Protein: 7 g/dL (ref 6.0–8.5)

## 2020-03-14 LAB — VITAMIN D 25 HYDROXY (VIT D DEFICIENCY, FRACTURES): Vit D, 25-Hydroxy: 26.8 ng/mL — ABNORMAL LOW (ref 30.0–100.0)

## 2020-03-14 LAB — LIPID PANEL
Chol/HDL Ratio: 5.2 ratio — ABNORMAL HIGH (ref 0.0–5.0)
Cholesterol, Total: 203 mg/dL — ABNORMAL HIGH (ref 100–199)
HDL: 39 mg/dL — ABNORMAL LOW (ref >39)
LDL Chol Calc (NIH): 148 mg/dL — ABNORMAL HIGH (ref 0–99)
Triglycerides: 86 mg/dL (ref 0–149)
VLDL Cholesterol Cal: 16 mg/dL (ref 5–40)

## 2020-03-14 LAB — TSH: TSH: 3.27 u[IU]/mL (ref 0.450–4.500)

## 2020-03-18 DIAGNOSIS — M65322 Trigger finger, left index finger: Secondary | ICD-10-CM | POA: Diagnosis not present

## 2020-03-18 DIAGNOSIS — M65311 Trigger thumb, right thumb: Secondary | ICD-10-CM | POA: Diagnosis not present

## 2020-04-17 DIAGNOSIS — M1612 Unilateral primary osteoarthritis, left hip: Secondary | ICD-10-CM | POA: Diagnosis not present

## 2020-04-21 ENCOUNTER — Telehealth: Payer: Self-pay

## 2020-04-21 NOTE — Telephone Encounter (Signed)
Reviewed chard and forwarded to scheduling pool to get a pre-op exam scheduled.

## 2020-04-21 NOTE — Telephone Encounter (Signed)
Called pt and sch him for 1/28

## 2020-04-25 ENCOUNTER — Ambulatory Visit (INDEPENDENT_AMBULATORY_CARE_PROVIDER_SITE_OTHER): Payer: BC Managed Care – PPO

## 2020-04-25 ENCOUNTER — Other Ambulatory Visit: Payer: Self-pay

## 2020-04-25 ENCOUNTER — Ambulatory Visit: Payer: BC Managed Care – PPO | Admitting: Family Medicine

## 2020-04-25 ENCOUNTER — Encounter: Payer: Self-pay | Admitting: Family Medicine

## 2020-04-25 VITALS — BP 138/80 | HR 68 | Temp 98.0°F | Ht 75.0 in | Wt 276.2 lb

## 2020-04-25 DIAGNOSIS — Z01818 Encounter for other preprocedural examination: Secondary | ICD-10-CM

## 2020-04-25 DIAGNOSIS — Z136 Encounter for screening for cardiovascular disorders: Secondary | ICD-10-CM

## 2020-04-25 DIAGNOSIS — R739 Hyperglycemia, unspecified: Secondary | ICD-10-CM

## 2020-04-25 NOTE — Progress Notes (Signed)
Subjective:  Patient ID: Scott [name], male    DOB: 11-23-1960  Age: 60 y.o. MRN: [id number]  CC:  Chief Complaint  Patient presents with  . surgery clearance    Left hip replacement on scheduled 05/16/20    HPI Scott [name]   Preoperative clearance Left hip replacement scheduled [name] 05/16/2020, Dr. Mayer [name]. Form provided with requested labs today. R knee surgery few years, no complications with anesthesia. Plan [name] possible spinal anesthesia, sedation.   He does have a history of hyperlipidemia, obstructive sleep apnea on CPAP.  Compliant with CPAP.  No known history of TIA, CKD, CVA, CHF. No CP with exertion or known hx of CAD. Bike riding without CP/dyspnea. No difficulty with flights of stairs - only hip limiting.  Over 6 mets without issue.  Mild hyperglycemia in October and December.  Renal function normal in December, and previously. Last CBC with normal hemoglobin in October 2021. Last EKG in 2018.     History Patient Active Problem List   Diagnosis Date Noted  . OSA on CPAP 03/11/2017  . Obesity 07/10/2015  . Hyperlipidemia   . Unspecified vitamin D deficiency   . Herpes simplex type II infection    Past Medical History:  Diagnosis Date  . Asperger's syndrome    Never formally diagnosed - potential traits of  . Herpes simplex type II infection   . Hyperlipidemia   . Malignant neoplasm of prostate ([place])   . Multiple lipomas   . Premature ejaculation 07/2004  . Pulmonary embolism ([place]) 04/2009  . Unspecified vitamin D deficiency    Past Surgical History:  Procedure Laterality Date  . CATARACT EXTRACTION, BILATERAL    . EYE SURGERY N/A    [facility]rees[facility] 02/01/2020  . Hip repalcement  2012  . JOINT REPLACEMENT N/A    [facility] 02/01/2020  . KNEE ARTHROSCOPY W/ MENISCAL REPAIR Left 12/2014  . PROSTATE SURGERY N/A    [facility] 02/01/2020  . SHOULDER OPEN ROTATOR CUFF REPAIR    . XI ROBOTIC ASSISTED SIMPLE PROSTATECTOMY  05/21/2009   No  Known Allergies Prior to Admission medications   Medication Sig Start Date End Date Taking? Authorizing Provider  Ascorbic Acid (VITAMIN C) 1000 MG tablet Take 1,000 mg by mouth daily.   Yes [provider]  Cholecalciferol 25 MCG (1000 UT) tablet Take by mouth.   Yes [provider]  rosuvastatin (CRESTOR) 40 MG tablet Take 1 tablet (40 mg total) by mouth daily. 02/04/20  Yes Scott Agreste, MD  tadalafil (CIALIS) 5 MG tablet Take 1 tablet (5 mg total) by mouth daily as needed. 02/26/20  Yes Scott Agreste, MD  valACYclovir (VALTREX) 500 MG tablet Take 1 tablet (500 mg total) by mouth daily. Patient taking differently: Take 500 mg by mouth as needed. 06/17/16  Yes Scott Agreste, MD  vitamin k 100 MCG tablet Take 100 mcg by mouth daily.   Yes [provider]  zinc sulfate 220 (50 Zn) MG capsule Take 220 mg by mouth daily.   Yes [provider]  aspirin EC 81 MG tablet Take 81 mg by mouth daily. Patient not taking: Reported on 04/25/2020    [provider]   Social History   Socioeconomic History  . Marital status: Married    Spouse name: Scott [name]  . Number of children: 2  . Years of education: 36  . Highest education level: Master's degree (e.g., MA, MS, MEng, MEd, MSW, MBA)  Occupational History  .  Not on file  Tobacco Use  . Smoking status: Light Tobacco Smoker    Last attempt to quit: 03/29/2002    Years since quitting: 18.0  . Smokeless tobacco: Never Used  . Tobacco comment: Smokes cigar when playing golf  Vaping Use  . Vaping Use: Never used  Substance and Sexual Activity  . Alcohol use: Yes    Alcohol/week: 2.0 - 4.0 standard drinks    Types: 2 - 4 Cans of beer per week  . Drug use: No  . Sexual activity: Yes  Other Topics Concern  . Not on file  Social History Narrative  . Not on file   Social Determinants of Health   Financial Resource Strain: Not on file  Food Insecurity: Not on file  Transportation Needs: Not on  file  Physical Activity: Not on file  Stress: Not on file  Social Connections: Not on file  Intimate Partner Violence: Not on file    Review of Systems Per HPI.   Objective:   Vitals:   04/25/20 0947 04/25/20 0950  BP: (!) 147/78 138/80  Pulse: 68   Temp: 98 F (36.7 C)   TempSrc: Temporal   SpO2: 95%   Weight: 276 lb 3.2 oz (125.3 kg)   Height: _0  (1.905 m)      Physical Exam Vitals reviewed.  Constitutional:      Appearance: He is well-developed and well-nourished.  HENT:     Head: Normocephalic and atraumatic.  Eyes:     Extraocular Movements: EOM normal.     Pupils: Pupils are equal, round, and reactive to light.  Neck:     Vascular: No carotid bruit or JVD.  Cardiovascular:     Rate and Rhythm: Normal rate and regular rhythm.     Heart sounds: Normal heart sounds. No murmur heard. No gallop.   Pulmonary:     Effort: Pulmonary effort is normal.     Breath sounds: No stridor. No wheezing, rhonchi or rales.  Musculoskeletal:        General: No edema.  Skin:    General: Skin is warm and dry.  Neurological:     Mental Status: He is alert and oriented to person, place, and time.  Psychiatric:        Mood and Affect: Mood and affect and mood normal.        Behavior: Behavior normal.    EKG: Sinus rhythm, rate 67, no apparent acute ST/T wave changes.  No appreciable changes from comparison EKG 06/17/16.    Assessment & Plan:  Scott [name] is a 60 y.o. male . Preoperative clearance - Plan: CBC with Differential/Platelet, Hemoglobin A1c, Protime-INR, CMP14+EGFR, DG Chest 2 View, EKG 12-Lead  Hyperglycemia - Plan: Hemoglobin A1c  Screening [name] cardiovascular condition - Plan: EKG 12-Lead  Appears to be acceptable risk [name] planned surgery, no apparent increased risk of major adverse cardiac event.  Labs obtained as above.  X-ray pending.  We will complete paperwork [name] surgeon.  No orders of the defined types were placed in this  encounter.  Patient Instructions   If plan [name] overnight recovery, bring your CPAP.  I will check labs and complete paperwork [name] your surgeon.    If you have lab work done today you will be contacted with your lab results within the next 2 weeks.  If you have not heard from [place] then please contact us. The fastest way to get your results is to register [name] My Chart.  IF you received an x-ray today, you will receive an invoice from [facility]. Please contact [facility]iology at [phone] with questions or concerns regarding your invoice.   IF you received labwork today, you will receive an invoice from [place]. Please contact [facility] at [phone] with questions or concerns regarding your invoice.   Our billing staff will not be able to assist you with questions regarding bills from these companies.  You will be contacted with the lab results as soon as they are available. The fastest way to get your results is to activate your My Chart account. Instructions are located on the last page of this paperwork. If you have not heard from [place] regarding the results in 2 weeks, please contact this office.          Signed, Merri Ray, MD [facility]

## 2020-04-25 NOTE — Patient Instructions (Addendum)
If plan for overnight recovery, bring your CPAP.  I will check labs and complete paperwork for your surgeon.    If you have lab work done today you will be contacted with your lab results within the next 2 weeks.  If you have not heard from [place] then please contact us. The fastest way to get your results is to register for My Chart.   IF you received an x-ray today, you will receive an invoice from [facility]. Please contact [facility] at [phone] with questions or concerns regarding your invoice.   IF you received labwork today, you will receive an invoice from [place]. Please contact [facility] at [phone] with questions or concerns regarding your invoice.   Our billing staff will not be able to assist you with questions regarding bills from these companies.  You will be contacted with the lab results as soon as they are available. The fastest way to get your results is to activate your My Chart account. Instructions are located on the last page of this paperwork. If you have not heard from [place] regarding the results in 2 weeks, please contact this office.

## 2020-04-26 LAB — CBC WITH DIFFERENTIAL/PLATELET
Basophils Absolute: 0 10*3/uL (ref 0.0–0.2)
Basos: 0 %
EOS (ABSOLUTE): 0 10*3/uL (ref 0.0–0.4)
Eos: 1 %
Hematocrit: 43.9 % (ref 37.5–51.0)
Hemoglobin: 14.7 g/dL (ref 13.0–17.7)
Immature Grans (Abs): 0 10*3/uL (ref 0.0–0.1)
Immature Granulocytes: 0 %
Lymphocytes Absolute: 1.6 10*3/uL (ref 0.7–3.1)
Lymphs: 28 %
MCH: 31.8 pg (ref 26.6–33.0)
MCHC: 33.5 g/dL (ref 31.5–35.7)
MCV: 95 fL (ref 79–97)
Monocytes Absolute: 0.4 10*3/uL (ref 0.1–0.9)
Monocytes: 7 %
Neutrophils Absolute: 3.7 10*3/uL (ref 1.4–7.0)
Neutrophils: 64 %
Platelets: 165 10*3/uL (ref 150–450)
RBC: 4.62 x10E6/uL (ref 4.14–5.80)
RDW: 12.1 % (ref 11.6–15.4)
WBC: 5.8 10*3/uL (ref 3.4–10.8)

## 2020-04-26 LAB — CMP14+EGFR
ALT: 29 IU/L (ref 0–44)
AST: 21 IU/L (ref 0–40)
Albumin/Globulin Ratio: 1.9 (ref 1.2–2.2)
Albumin: 4.6 g/dL (ref 3.8–4.9)
Alkaline Phosphatase: 87 IU/L (ref 44–121)
BUN/Creatinine Ratio: 10 (ref 10–24)
BUN: 9 mg/dL (ref 8–27)
Bilirubin Total: 0.6 mg/dL (ref 0.0–1.2)
CO2: 23 mmol/L (ref 20–29)
Calcium: 9.5 mg/dL (ref 8.6–10.2)
Chloride: 102 mmol/L (ref 96–106)
Creatinine, Ser: 0.93 mg/dL (ref 0.76–1.27)
GFR calc Af Amer: 103 mL/min/{1.73_m2} (ref >59)
GFR calc non Af Amer: 89 mL/min/{1.73_m2} (ref >59)
Globulin, Total: 2.4 g/dL (ref 1.5–4.5)
Glucose: 96 mg/dL (ref 65–99)
Potassium: 4.4 mmol/L (ref 3.5–5.2)
Sodium: 140 mmol/L (ref 134–144)
Total Protein: 7 g/dL (ref 6.0–8.5)

## 2020-04-26 LAB — HEMOGLOBIN A1C
Est. average glucose Bld gHb Est-mCnc: 111 mg/dL
Hgb A1c MFr Bld: 5.5 % (ref 4.8–5.6)

## 2020-04-26 LAB — PROTIME-INR
INR: 1.1 (ref 0.9–1.2)
Prothrombin Time: 11 s (ref 9.1–12.0)

## 2020-05-07 DIAGNOSIS — M1612 Unilateral primary osteoarthritis, left hip: Secondary | ICD-10-CM | POA: Diagnosis not present

## 2020-05-16 DIAGNOSIS — M1612 Unilateral primary osteoarthritis, left hip: Secondary | ICD-10-CM | POA: Diagnosis not present

## 2020-06-02 DIAGNOSIS — G4733 Obstructive sleep apnea (adult) (pediatric): Secondary | ICD-10-CM | POA: Diagnosis not present

## 2020-07-17 DIAGNOSIS — H26499 Other secondary cataract, unspecified eye: Secondary | ICD-10-CM | POA: Diagnosis not present

## 2020-07-17 DIAGNOSIS — Z961 Presence of intraocular lens: Secondary | ICD-10-CM | POA: Diagnosis not present

## 2020-07-30 DIAGNOSIS — M1990 Unspecified osteoarthritis, unspecified site: Secondary | ICD-10-CM | POA: Diagnosis not present

## 2020-07-30 DIAGNOSIS — Z1211 Encounter for screening for malignant neoplasm of colon: Secondary | ICD-10-CM | POA: Diagnosis not present

## 2020-07-30 DIAGNOSIS — E782 Mixed hyperlipidemia: Secondary | ICD-10-CM | POA: Diagnosis not present

## 2020-08-04 ENCOUNTER — Ambulatory Visit (INDEPENDENT_AMBULATORY_CARE_PROVIDER_SITE_OTHER): Payer: BC Managed Care – PPO | Admitting: Family Medicine

## 2020-08-04 ENCOUNTER — Other Ambulatory Visit: Payer: Self-pay

## 2020-08-04 ENCOUNTER — Encounter: Payer: Self-pay | Admitting: Family Medicine

## 2020-08-04 VITALS — BP 132/82 | HR 65 | Temp 98.2°F | Resp 16 | Ht 75.0 in | Wt 279.4 lb

## 2020-08-04 DIAGNOSIS — N529 Male erectile dysfunction, unspecified: Secondary | ICD-10-CM

## 2020-08-04 DIAGNOSIS — Z8619 Personal history of other infectious and parasitic diseases: Secondary | ICD-10-CM

## 2020-08-04 DIAGNOSIS — Z23 Encounter for immunization: Secondary | ICD-10-CM

## 2020-08-04 DIAGNOSIS — E785 Hyperlipidemia, unspecified: Secondary | ICD-10-CM | POA: Diagnosis not present

## 2020-08-04 DIAGNOSIS — R739 Hyperglycemia, unspecified: Secondary | ICD-10-CM

## 2020-08-04 DIAGNOSIS — R7989 Other specified abnormal findings of blood chemistry: Secondary | ICD-10-CM | POA: Diagnosis not present

## 2020-08-04 DIAGNOSIS — Z8546 Personal history of malignant neoplasm of prostate: Secondary | ICD-10-CM | POA: Diagnosis not present

## 2020-08-04 LAB — COMPREHENSIVE METABOLIC PANEL
ALT: 16 U/L (ref 0–53)
AST: 16 U/L (ref 0–37)
Albumin: 4.3 g/dL (ref 3.5–5.2)
Alkaline Phosphatase: 74 U/L (ref 39–117)
BUN: 11 mg/dL (ref 6–23)
CO2: 28 mEq/L (ref 19–32)
Calcium: 8.9 mg/dL (ref 8.4–10.5)
Chloride: 104 mEq/L (ref 96–112)
Creatinine, Ser: 0.82 mg/dL (ref 0.40–1.50)
GFR: 95.19 mL/min (ref >60.00)
Glucose, Bld: 97 mg/dL (ref 70–99)
Potassium: 4.3 mEq/L (ref 3.5–5.1)
Sodium: 139 mEq/L (ref 135–145)
Total Bilirubin: 0.8 mg/dL (ref 0.2–1.2)
Total Protein: 6.5 g/dL (ref 6.0–8.3)

## 2020-08-04 LAB — LIPID PANEL
Cholesterol: 178 mg/dL (ref 0–200)
HDL: 40.7 mg/dL (ref >39.00)
LDL Cholesterol: 119 mg/dL — ABNORMAL HIGH (ref 0–99)
NonHDL: 137.03
Total CHOL/HDL Ratio: 4
Triglycerides: 92 mg/dL (ref 0.0–149.0)
VLDL: 18.4 mg/dL (ref 0.0–40.0)

## 2020-08-04 LAB — HEMOGLOBIN A1C: Hgb A1c MFr Bld: 5.6 % (ref 4.6–6.5)

## 2020-08-04 LAB — VITAMIN D 25 HYDROXY (VIT D DEFICIENCY, FRACTURES): VITD: 28.13 ng/mL — ABNORMAL LOW (ref 30.00–100.00)

## 2020-08-04 MED ORDER — VALACYCLOVIR HCL 500 MG PO TABS: 500.0000 mg | ORAL_TABLET | Freq: Two times a day (BID) | ORAL | 3 refills | Status: DC

## 2020-08-04 NOTE — Patient Instructions (Signed)
Valtrex 2 times per day for 3 days with flare.  Continue Crestor same dose. Watch diet and exercise for weight management and cholesterol/blood sugar.  Vitamin D 2000units per day.  Thanks for coming in today.

## 2020-08-04 NOTE — Progress Notes (Signed)
Subjective:  Patient ID: Scott [name], male    DOB: 07/23/1960  Age: 60 y.o. MRN: [id number]  CC:  Chief Complaint  Patient presents with  . Diabetes    Pt reports doing well no concerns   . Hyperlipidemia    Pt reports has restarted Crestor, pt notes has only had 1 cup black coffee this morning and is willing to do lab work     HPI [employer] presents for   Hyperlipidemia: Prior Crestor - off meds at Nov 2021 visit. Restarted prior dose 40mg  qd.  Prior hyperglycemia but not diabetes, normal A1c in January.  No new myalgias or side effects with meds.  Fasting today.   Lab Results  Component Value Date   HGBA1C 5.5 04/25/2020    Lab Results  Component Value Date   CHOL 203 (H) 03/13/2020   HDL 39 (L) 03/13/2020   LDLCALC 148 (H) 03/13/2020   TRIG 86 03/13/2020   CHOLHDL 5.2 (H) 03/13/2020   Lab Results  Component Value Date   ALT 29 04/25/2020   AST 21 04/25/2020   ALKPHOS 87 04/25/2020   BILITOT 0.6 04/25/2020   Vitamin D deficiency Taking otc vit D supplement - few doses per week. Unknown dose.   Lab Results  Component Value Date   VD25OH 26.8 (L) 03/13/2020   Erectile dysfunction Treated with Cialis 5 mg daily.. Working well - taking 3 times per week.  No color change to vision/hearing changes. No chest pain or dyspnea with exertion.   eye procedure in few weeks, colonoscopy this month as well.  Plans to follow up with hand re: trigger finger.  walking more now that he has improved from hip replacement.   Genital HSV: Last flare 2 weeks ago. Prior flare over 6 months prior. Takes med QD until resolves.   Prostate CAncer Prior pt of Dr. Alinda Money, over 10 years since radical prostatectomy. Released by urology for follow up PSA with me. Undetectable PSA in 05/2019.   History Patient Active Problem List   Diagnosis Date Noted  . OSA on CPAP 03/11/2017  . Obesity 07/10/2015  . Hyperlipidemia   . Unspecified vitamin D deficiency   .  Herpes simplex type II infection    Past Medical History:  Diagnosis Date  . Asperger's syndrome    Never formally diagnosed - potential traits of  . Herpes simplex type II infection   . Hyperlipidemia   . Malignant neoplasm of prostate ([place])   . Multiple lipomas   . Premature ejaculation 07/2004  . Pulmonary embolism ([place]) 04/2009  . Unspecified vitamin D deficiency    Past Surgical History:  Procedure Laterality Date  . CATARACT EXTRACTION, BILATERAL    . EYE SURGERY N/A    [facility] 02/01/2020  . Hip repalcement  2012  . JOINT REPLACEMENT N/A    [facility] 02/01/2020  . KNEE ARTHROSCOPY W/ MENISCAL REPAIR Left 12/2014  . PROSTATE SURGERY N/A    [facility] 02/01/2020  . SHOULDER OPEN ROTATOR CUFF REPAIR    . XI ROBOTIC ASSISTED SIMPLE PROSTATECTOMY  05/21/2009   No Known Allergies Prior to Admission medications   Medication Sig Start Date End Date Taking? Authorizing Provider  Ascorbic Acid (VITAMIN C) 1000 MG tablet Take 1,000 mg by mouth daily.   Yes [provider]  Cholecalciferol 25 MCG (1000 UT) tablet Take by mouth.   Yes [provider]  rosuvastatin (CRESTOR) 40 MG tablet Take 1 tablet (40 mg total) by mouth daily.  02/04/20  Yes [name], MD  tadalafil (CIALIS) 5 MG tablet Take 1 tablet (5 mg total) by mouth daily as needed. 02/26/20  Yes [name], MD  valACYclovir (VALTREX) 500 MG tablet Take 1 tablet (500 mg total) by mouth daily. Patient taking differently: Take 500 mg by mouth as needed. 06/17/16  Yes [name], MD  vitamin k 100 MCG tablet Take 100 mcg by mouth daily.   Yes [provider]  zinc sulfate 220 (50 Zn) MG capsule Take 220 mg by mouth daily.   Yes [provider]   Social History   Socioeconomic History  . Marital status: Married    Spouse name: [name]  . Number of children: 2  . Years of education: 46  . Highest education level: Master's degree (e.g., MA, MS, MEng, MEd, MSW, MBA)   Occupational History  . Not on file  Tobacco Use  . Smoking status: Light Tobacco Smoker    Last attempt to quit: 03/29/2002    Years since quitting: 18.3  . Smokeless tobacco: Never Used  . Tobacco comment: Smokes cigar when playing golf  Vaping Use  . Vaping Use: Never used  Substance and Sexual Activity  . Alcohol use: Yes    Alcohol/week: 2.0 - 4.0 standard drinks    Types: 2 - 4 Cans of beer per week  . Drug use: No  . Sexual activity: Yes  Other Topics Concern  . Not on file  Social History Narrative  . Not on file   Social Determinants of Health   Financial Resource Strain: Not on file  Food Insecurity: Not on file  Transportation Needs: Not on file  Physical Activity: Not on file  Stress: Not on file  Social Connections: Not on file  Intimate Partner Violence: Not on file    Review of Systems  Constitutional: Negative for fatigue and unexpected weight change.  Eyes: Negative for visual disturbance (some blurring - having procedure with optho. ).  Respiratory: Negative for cough, chest tightness and shortness of breath.   Cardiovascular: Negative for chest pain, palpitations and leg swelling.  Gastrointestinal: Negative for abdominal pain and blood in stool.  Neurological: Negative for dizziness, light-headedness and headaches.     Objective:   Vitals:   08/04/20 0812  BP: 132/82  Pulse: 65  Resp: 16  Temp: 98.2 F (36.8 C)  TempSrc: Temporal  SpO2: 97%  Weight: 279 lb 6.4 oz (126.7 kg)  Height: 6\' 3"  (1.905 m)     Physical Exam Vitals reviewed.  Constitutional:      Appearance: He is well-developed.  HENT:     Head: Normocephalic and atraumatic.  Eyes:     Pupils: Pupils are equal, round, and reactive to light.  Neck:     Vascular: No carotid bruit or JVD.  Cardiovascular:     Rate and Rhythm: Normal rate and regular rhythm.     Heart sounds: Normal heart sounds. No murmur heard.   Pulmonary:     Effort: Pulmonary effort is normal.      Breath sounds: Normal breath sounds. No rales.  Skin:    General: Skin is warm and dry.  Neurological:     Mental Status: He is alert and oriented to person, place, and time.        Assessment & Plan:  Scott [name] is a 60 y.o. male . Hyperglycemia - Plan: Hemoglobin A1c  - prior elevated reading but A1c ok. Repeat A1c.  History of herpes genitalis - Plan: Varicella-zoster vaccine IM, valACYclovir (VALTREX) 500 MG tablet  - uncontrolled with recent flare. refilled valtrex for prn use. Daily use as option if more frequent flares.   Hyperlipidemia, unspecified hyperlipidemia type - Plan: Lipid panel, Comprehensive metabolic panel  - check labs, continue Crestor same dose.   Low serum vitamin D - Plan: Vitamin D (25 hydroxy)  - otc supplement discussed.   - check labs.   Erectile dysfunction, unspecified erectile dysfunction type  - stable with Cialis. Continue same.   Personal history of prostate cancer - Plan: PSA  - s/p prostatectomy. Undetectable PSA - released form urology for ongoing monitoring here - check PSA.   Meds ordered this encounter  Medications  . valACYclovir (VALTREX) 500 MG tablet    Sig: Take 1 tablet (500 mg total) by mouth 2 (two) times daily. For 3 days at time of flair.    Dispense:  12 tablet    Refill:  3   Patient Instructions  Valtrex 2 times per day for 3 days with flare.  Continue Crestor same dose. Watch diet and exercise for weight management and cholesterol/blood sugar.  Vitamin D 2000units per day.  Thanks for coming in today.      Signed, Merri Ray, MD [facility]

## 2020-08-05 LAB — PSA: PSA: 0 ng/mL — ABNORMAL LOW (ref 0.10–4.00)

## 2020-08-13 DIAGNOSIS — Z85820 Personal history of malignant melanoma of skin: Secondary | ICD-10-CM | POA: Diagnosis not present

## 2020-08-13 DIAGNOSIS — L821 Other seborrheic keratosis: Secondary | ICD-10-CM | POA: Diagnosis not present

## 2020-08-13 DIAGNOSIS — L814 Other melanin hyperpigmentation: Secondary | ICD-10-CM | POA: Diagnosis not present

## 2020-08-19 DIAGNOSIS — D123 Benign neoplasm of transverse colon: Secondary | ICD-10-CM | POA: Diagnosis not present

## 2020-08-19 DIAGNOSIS — D122 Benign neoplasm of ascending colon: Secondary | ICD-10-CM | POA: Diagnosis not present

## 2020-08-19 DIAGNOSIS — Z1211 Encounter for screening for malignant neoplasm of colon: Secondary | ICD-10-CM | POA: Diagnosis not present

## 2020-08-19 DIAGNOSIS — K635 Polyp of colon: Secondary | ICD-10-CM | POA: Diagnosis not present

## 2020-08-19 DIAGNOSIS — K5730 Diverticulosis of large intestine without perforation or abscess without bleeding: Secondary | ICD-10-CM | POA: Diagnosis not present

## 2020-08-21 DIAGNOSIS — H18413 Arcus senilis, bilateral: Secondary | ICD-10-CM | POA: Diagnosis not present

## 2020-08-21 DIAGNOSIS — H26491 Other secondary cataract, right eye: Secondary | ICD-10-CM | POA: Diagnosis not present

## 2020-08-21 DIAGNOSIS — H02831 Dermatochalasis of right upper eyelid: Secondary | ICD-10-CM | POA: Diagnosis not present

## 2020-08-21 DIAGNOSIS — Z961 Presence of intraocular lens: Secondary | ICD-10-CM | POA: Diagnosis not present

## 2020-09-02 DIAGNOSIS — M65311 Trigger thumb, right thumb: Secondary | ICD-10-CM | POA: Diagnosis not present

## 2020-09-02 DIAGNOSIS — M65322 Trigger finger, left index finger: Secondary | ICD-10-CM | POA: Diagnosis not present

## 2020-09-08 DIAGNOSIS — L089 Local infection of the skin and subcutaneous tissue, unspecified: Secondary | ICD-10-CM | POA: Diagnosis not present

## 2020-09-08 DIAGNOSIS — L720 Epidermal cyst: Secondary | ICD-10-CM | POA: Diagnosis not present

## 2020-09-12 ENCOUNTER — Ambulatory Visit: Payer: BC Managed Care – PPO | Admitting: Nurse Practitioner

## 2020-09-12 ENCOUNTER — Encounter: Payer: Self-pay | Admitting: Nurse Practitioner

## 2020-09-12 ENCOUNTER — Other Ambulatory Visit: Payer: Self-pay

## 2020-09-12 VITALS — BP 134/84 | HR 72 | Temp 99.1°F | Resp 16

## 2020-09-12 DIAGNOSIS — Z4800 Encounter for change or removal of nonsurgical wound dressing: Secondary | ICD-10-CM

## 2020-09-12 NOTE — Progress Notes (Signed)
   Subjective:    Patient ID: Scott [name], male    DOB: 1960/06/01, 60 y.o.   MRN: [id number]  HPI  60 year old male presenting to clinic today for dressing change to right upper back.   He had a cyst removed by his plastic surgeon that is pending culture.  His wife has been changing his dressing at home but she is traveling today  Denies systemic symptoms/ no fever  Today's Vitals   09/12/20 0815  BP: 134/84  Pulse: 72  Resp: 16  Temp: 99.1 F (37.3 C)  TempSrc: Tympanic  SpO2: 96%   There is no height or weight on file to calculate BMI.    Review of Systems  Constitutional: Negative.   HENT: Negative.    Respiratory: Negative.    Skin:  Positive for wound.   Current Outpatient Medications  Medication Instructions   Cholecalciferol 25 MCG (1000 UT) tablet Oral   rosuvastatin (CRESTOR) 40 mg, Oral, Daily   tadalafil (CIALIS) 5 mg, Oral, Daily PRN   valACYclovir (VALTREX) 500 mg, Oral, 2 times daily, For 3 days at time of flair.   vitamin C 1,000 mg, Oral, Daily   vitamin k 100 mcg, Oral, Daily   zinc sulfate 220 mg, Oral, Daily        Objective:   Physical Exam Skin:    Findings: Wound present.          Comments: Surgical wound to right upper back as highlighted above  oval shaped wound, clear defines boarders. Wound bed without signs of infection, granulation present. Surrounding skin slightly irritated from tape without signs of infection. Skin temperature uniform no active drainage.    Neurological:     Mental Status: He is alert.        Assessment & Plan:  Wound bed cleaned with saline and packed with wet gauze. Secured with paper tape  RTC as needed.

## 2020-09-19 DIAGNOSIS — M65322 Trigger finger, left index finger: Secondary | ICD-10-CM | POA: Diagnosis not present

## 2020-10-06 DIAGNOSIS — D481 Neoplasm of uncertain behavior of connective and other soft tissue: Secondary | ICD-10-CM | POA: Diagnosis not present

## 2020-11-13 DIAGNOSIS — Z96642 Presence of left artificial hip joint: Secondary | ICD-10-CM | POA: Diagnosis not present

## 2020-12-17 ENCOUNTER — Other Ambulatory Visit: Payer: Self-pay

## 2020-12-17 ENCOUNTER — Ambulatory Visit: Payer: BC Managed Care – PPO

## 2020-12-17 DIAGNOSIS — Z23 Encounter for immunization: Secondary | ICD-10-CM

## 2020-12-22 DIAGNOSIS — M7632 Iliotibial band syndrome, left leg: Secondary | ICD-10-CM | POA: Diagnosis not present

## 2020-12-24 DIAGNOSIS — M7632 Iliotibial band syndrome, left leg: Secondary | ICD-10-CM | POA: Diagnosis not present

## 2020-12-26 DIAGNOSIS — G4733 Obstructive sleep apnea (adult) (pediatric): Secondary | ICD-10-CM | POA: Diagnosis not present

## 2020-12-29 DIAGNOSIS — M7632 Iliotibial band syndrome, left leg: Secondary | ICD-10-CM | POA: Diagnosis not present

## 2020-12-31 DIAGNOSIS — M7632 Iliotibial band syndrome, left leg: Secondary | ICD-10-CM | POA: Diagnosis not present

## 2021-01-19 DIAGNOSIS — M7632 Iliotibial band syndrome, left leg: Secondary | ICD-10-CM | POA: Diagnosis not present

## 2021-01-25 DIAGNOSIS — G4733 Obstructive sleep apnea (adult) (pediatric): Secondary | ICD-10-CM | POA: Diagnosis not present

## 2021-02-05 ENCOUNTER — Encounter: Payer: Self-pay | Admitting: Family Medicine

## 2021-02-05 ENCOUNTER — Ambulatory Visit (INDEPENDENT_AMBULATORY_CARE_PROVIDER_SITE_OTHER): Payer: BC Managed Care – PPO | Admitting: Family Medicine

## 2021-02-05 VITALS — BP 114/78 | HR 60 | Temp 98.3°F | Resp 16 | Ht 75.0 in | Wt 276.4 lb

## 2021-02-05 DIAGNOSIS — Z0000 Encounter for general adult medical examination without abnormal findings: Secondary | ICD-10-CM

## 2021-02-05 DIAGNOSIS — Z125 Encounter for screening for malignant neoplasm of prostate: Secondary | ICD-10-CM

## 2021-02-05 DIAGNOSIS — E785 Hyperlipidemia, unspecified: Secondary | ICD-10-CM | POA: Diagnosis not present

## 2021-02-05 DIAGNOSIS — E559 Vitamin D deficiency, unspecified: Secondary | ICD-10-CM

## 2021-02-05 DIAGNOSIS — Z130 Encounter for screening for diseases of the blood and blood-forming organs and certain disorders involving the immune mechanism: Secondary | ICD-10-CM

## 2021-02-05 DIAGNOSIS — N529 Male erectile dysfunction, unspecified: Secondary | ICD-10-CM | POA: Diagnosis not present

## 2021-02-05 DIAGNOSIS — R739 Hyperglycemia, unspecified: Secondary | ICD-10-CM | POA: Diagnosis not present

## 2021-02-05 LAB — LIPID PANEL
Cholesterol: 211 mg/dL — ABNORMAL HIGH (ref 0–200)
HDL: 43.5 mg/dL (ref >39.00)
LDL Cholesterol: 156 mg/dL — ABNORMAL HIGH (ref 0–99)
NonHDL: 167.3
Total CHOL/HDL Ratio: 5
Triglycerides: 55 mg/dL (ref 0.0–149.0)
VLDL: 11 mg/dL (ref 0.0–40.0)

## 2021-02-05 LAB — CBC WITH DIFFERENTIAL/PLATELET
Basophils Absolute: 0 10*3/uL (ref 0.0–0.1)
Basophils Relative: 0.5 % (ref 0.0–3.0)
Eosinophils Absolute: 0 10*3/uL (ref 0.0–0.7)
Eosinophils Relative: 0.7 % (ref 0.0–5.0)
HCT: 42.4 % (ref 39.0–52.0)
Hemoglobin: 14.3 g/dL (ref 13.0–17.0)
Lymphocytes Relative: 25.8 % (ref 12.0–46.0)
Lymphs Abs: 1.2 10*3/uL (ref 0.7–4.0)
MCHC: 33.7 g/dL (ref 30.0–36.0)
MCV: 93.3 fl (ref 78.0–100.0)
Monocytes Absolute: 0.4 10*3/uL (ref 0.1–1.0)
Monocytes Relative: 8.3 % (ref 3.0–12.0)
Neutro Abs: 2.9 10*3/uL (ref 1.4–7.7)
Neutrophils Relative %: 64.7 % (ref 43.0–77.0)
Platelets: 151 10*3/uL (ref 150.0–400.0)
RBC: 4.54 Mil/uL (ref 4.22–5.81)
RDW: 13.5 % (ref 11.5–15.5)
WBC: 4.5 10*3/uL (ref 4.0–10.5)

## 2021-02-05 LAB — COMPREHENSIVE METABOLIC PANEL
ALT: 15 U/L (ref 0–53)
AST: 16 U/L (ref 0–37)
Albumin: 4.5 g/dL (ref 3.5–5.2)
Alkaline Phosphatase: 65 U/L (ref 39–117)
BUN: 15 mg/dL (ref 6–23)
CO2: 26 mEq/L (ref 19–32)
Calcium: 9.3 mg/dL (ref 8.4–10.5)
Chloride: 104 mEq/L (ref 96–112)
Creatinine, Ser: 0.88 mg/dL (ref 0.40–1.50)
GFR: 92.85 mL/min (ref >60.00)
Glucose, Bld: 98 mg/dL (ref 70–99)
Potassium: 4.7 mEq/L (ref 3.5–5.1)
Sodium: 139 mEq/L (ref 135–145)
Total Bilirubin: 0.6 mg/dL (ref 0.2–1.2)
Total Protein: 6.9 g/dL (ref 6.0–8.3)

## 2021-02-05 LAB — HEMOGLOBIN A1C: Hgb A1c MFr Bld: 5.7 % (ref 4.6–6.5)

## 2021-02-05 LAB — VITAMIN D 25 HYDROXY (VIT D DEFICIENCY, FRACTURES): VITD: 29.08 ng/mL — ABNORMAL LOW (ref 30.00–100.00)

## 2021-02-05 MED ORDER — TADALAFIL 5 MG PO TABS: 5.0000 mg | ORAL_TABLET | Freq: Every day | ORAL | 11 refills | Status: DC | PRN

## 2021-02-05 MED ORDER — ROSUVASTATIN CALCIUM 40 MG PO TABS: 40.0000 mg | ORAL_TABLET | Freq: Every day | ORAL | 3 refills | Status: DC

## 2021-02-05 NOTE — Patient Instructions (Addendum)
[name] is now offering the bivalent Covid booster vaccine. To schedule an appointment or to find more information, visit [email or web address] You can also call [phone], Monday through Friday, 7 a.m. to 7 p.m.  Keep up the good work with walking, exercise.  Some form of breakfast each morning may help with metabolism and weight loss.  Continue to watch diet, food choices and portions.  Thanks for coming in today.  Recheck in 6 months but let me know if there are questions sooner.  Preventive Care 62-60 Years Old, Male Preventive care refers to lifestyle choices and visits with your health care provider that can promote health and wellness. Preventive care visits are also called wellness exams. What can I expect for my preventive care visit? Counseling During your preventive care visit, your health care provider may ask about your: Medical history, including: Past medical problems. Family medical history. Current health, including: Emotional well-being. Home life and relationship well-being. Sexual activity. Lifestyle, including: Alcohol, nicotine or tobacco, and drug use. Access to firearms. Diet, exercise, and sleep habits. Safety issues such as seatbelt and bike helmet use. Sunscreen use. Work and work Statistician. Physical exam Your health care provider will check your: Height and weight. These may be used to calculate your BMI (body mass index). BMI is a measurement that tells if you are at a healthy weight. Waist circumference. This measures the distance around your waistline. This measurement also tells if you are at a healthy weight and may help predict your risk of certain diseases, such as type 2 diabetes and high blood pressure. Heart rate and blood pressure. Body temperature. Skin for abnormal spots. What immunizations do I need? Vaccines are usually given at various ages, according to a schedule. Your health care provider will recommend vaccines for you based on  your age, medical history, and lifestyle or other factors, such as travel or where you work. What tests do I need? Screening Your health care provider may recommend screening tests for certain conditions. This may include: Lipid and cholesterol levels. Diabetes screening. This is done by checking your blood sugar (glucose) after you have not eaten for a while (fasting). Hepatitis B test. Hepatitis C test. HIV (human immunodeficiency virus) test. STI (sexually transmitted infection) testing, if you are at risk. Lung cancer screening. Prostate cancer screening. Colorectal cancer screening. Talk with your health care provider about your test results, treatment options, and if necessary, the need for more tests. Follow these instructions at home: Eating and drinking  Eat a diet that includes fresh fruits and vegetables, whole grains, lean protein, and low-fat dairy products. Take vitamin and mineral supplements as recommended by your health care provider. Do not drink alcohol if your health care provider tells you not to drink. If you drink alcohol: Limit how much you have to 0-2 drinks a day. Know how much alcohol is in your drink. In the [place] one drink equals one 12 oz bottle of beer (355 mL), one 5 oz glass of wine (148 mL), or one 1 oz glass of hard liquor (44 mL). Lifestyle Brush your teeth every morning and night with fluoride toothpaste. Floss one time each day. Exercise for at least 30 minutes 5 or more days each week. Do not use any products that contain nicotine or tobacco. These products include cigarettes, chewing tobacco, and vaping devices, such as e-cigarettes. If you need help quitting, ask your health care provider. Do not use drugs. If you are sexually active, practice safe sex. Use a condom  or other form of protection to prevent STIs. Take aspirin only as told by your health care provider. Make sure that you understand how much to take and what form to take. Work with  your health care provider to find out whether it is safe and beneficial for you to take aspirin daily. Find healthy ways to manage stress, such as: Meditation, yoga, or listening to music. Journaling. Talking to a trusted person. Spending time with friends and family. Minimize exposure to UV radiation to reduce your risk of skin cancer. Safety Always wear your seat belt while driving or riding in a vehicle. Do not drive: If you have been drinking alcohol. Do not ride with someone who has been drinking. When you are tired or distracted. While texting. If you have been using any mind-altering substances or drugs. Wear a helmet and other protective equipment during sports activities. If you have firearms in your house, make sure you follow all gun safety procedures. What's next? Go to your health care provider once a year for an annual wellness visit. Ask your health care provider how often you should have your eyes and teeth checked. Stay up to date on all vaccines. This information is not intended to replace advice given to you by your health care provider. Make sure you discuss any questions you have with your health care provider. Document Revised: 09/10/2020 Document Reviewed: 09/10/2020 Elsevier Patient Education  Scott [name].

## 2021-02-05 NOTE — Progress Notes (Signed)
Subjective:  Patient ID: Scott [name], male    DOB: 07-Apr-1960  Age: 60 y.o. MRN: [id number]  CC:  Chief Complaint  Patient presents with   Annual Exam    Pt here for annual exam, pt notes he is in need of refills no concerns with either no side effects     HPI Scott [name] presents for   Annual exam  Hyperlipidemia with obesity. Crestor 5mg  qd.  Prior hyperglycemia with normal A1c.  Weight decreased 3 pounds since last visit. No fast food, rare fried food.  Body mass index is 34.55 kg/m.  Wt Readings from Last 3 Encounters:  02/05/21 276 lb 6.4 oz (125.4 kg)  08/04/20 279 lb 6.4 oz (126.7 kg)  04/25/20 276 lb 3.2 oz (125.3 kg)    Lab Results  Component Value Date   CHOL 178 08/04/2020   HDL 40.70 08/04/2020   LDLCALC 119 (H) 08/04/2020   TRIG 92.0 08/04/2020   CHOLHDL 4 08/04/2020   Lab Results  Component Value Date   ALT 16 08/04/2020   AST 16 08/04/2020   ALKPHOS 74 08/04/2020   BILITOT 0.8 08/04/2020   OSA on CPAP Compliant with use, appt next week with sleep specialist.   Vitamin D deficiency Over-the-counter vitamin D supplement.  Recommended 2000 units/day  - with a few additional doses per week as borderline levels in May. Taking every few days.  Last vitamin D Lab Results  Component Value Date   VD25OH 28.13 (L) 08/04/2020   Erectile dysfunction Cialis 5 mg has been effective, 3-4 days per week, working well.  no vision/hearing changes or chest pain/dyspnea with exertion.  History of genital HSV Last flare last weekend. 34 flares per year.  Treats with Valtrex during flares - few days. Working well.   Cancer screening Colon: Gastroenterology Dr. Benson Norway, colonoscopy in May. Multiple polyps, repeat 3 yrs.  Prostate cancer, status post radical prostatectomy, over 10 years ago, Dr. Alinda Money.  Released by urology for PSA monitoring with me.  Undetectable when last checked in May. Lab Results  Component Value Date   PSA 0.00 Repeated and  verified X2. (L) 08/04/2020  Derm: Has been seen in June, July by Dr. Harlow Mares with infected epidermoid cyst.   Immunization History  Administered Date(s) Administered   Influenza,inj,Quad PF,6+ Mos 12/20/2018, 12/17/2020   Influenza-Unspecified 03/29/2010, 12/27/2013, 12/28/2015, 12/31/2016, 12/22/2017, 12/12/2019   Moderna Sars-Covid-2 Vaccination 06/08/2019, 07/06/2019   PFIZER(Purple Top)SARS-COV-2 Vaccination 03/11/2020   Td 10/03/2006   Tdap 12/21/2016   Zoster Recombinat (Shingrix) 02/04/2020, 08/04/2020  COVID vaccine booster: had 1 booster 02/2020. Recommended bivalent booster.   Vision Screening   Right eye Left eye Both eyes  Without correction 20/20 20/20 20/20   With correction     Reading glasses, prior cataract surgery 7-8 yrs ago. Optometry yearly.   Dental: Every 6 months.   Alcohol Occasional - 1-2 per week  Tobacco Occasional cigar with golf - less than once per week.   Exercise Walking was improving at his May visit since hip replacement. Now walking 4-5 days per week, 30-45. No resistance exercise. Some golf.   History Patient Active Problem List   Diagnosis Date Noted   OSA on CPAP 03/11/2017   Obesity 07/10/2015   Hyperlipidemia    Unspecified vitamin D deficiency    Herpes simplex type II infection    Past Medical History:  Diagnosis Date   Asperger's syndrome    Never formally diagnosed - potential traits of   Herpes  simplex type II infection    Hyperlipidemia    Malignant neoplasm of prostate ([place])    Multiple lipomas    Premature ejaculation 07/2004   Pulmonary embolism ([place]) 04/2009   Unspecified vitamin D deficiency    Past Surgical History:  Procedure Laterality Date   CATARACT EXTRACTION, BILATERAL     EYE SURGERY N/A    [facility] 02/01/2020   Hip repalcement  2012   JOINT REPLACEMENT N/A    [facility] 02/01/2020   KNEE ARTHROSCOPY W/ MENISCAL REPAIR Left 12/2014   PROSTATE SURGERY N/A    [facility] 02/01/2020   replacment      Lt hip Feb 2022   SHOULDER OPEN ROTATOR CUFF REPAIR     XI ROBOTIC ASSISTED SIMPLE PROSTATECTOMY  05/21/2009   No Known Allergies Prior to Admission medications   Medication Sig Start Date End Date Taking? Authorizing Provider  Ascorbic Acid (VITAMIN C) 1000 MG tablet Take 1,000 mg by mouth daily.   Yes [provider]  Cholecalciferol 25 MCG (1000 UT) tablet Take by mouth.   Yes [provider]  rosuvastatin (CRESTOR) 40 MG tablet Take 1 tablet (40 mg total) by mouth daily. 02/04/20  Yes [name], MD  tadalafil (CIALIS) 5 MG tablet Take 1 tablet (5 mg total) by mouth daily as needed. 02/26/20  Yes [name], MD  valACYclovir (VALTREX) 500 MG tablet Take 1 tablet (500 mg total) by mouth 2 (two) times daily. For 3 days at time of flair. 08/04/20  Yes [name], MD  vitamin k 100 MCG tablet Take 100 mcg by mouth daily.   Yes [provider]  zinc sulfate 220 (50 Zn) MG capsule Take 220 mg by mouth daily.   Yes [provider]   Social History   Socioeconomic History   Marital status: Married    Spouse name: Vaunda   Number of children: 2   Years of education: 18   Highest education level: Master's degree (e.g., MA, MS, MEng, MEd, MSW, MBA)  Occupational History   Not on file  Tobacco Use   Smoking status: Light Smoker    Types: Cigars   Smokeless tobacco: Never   Tobacco comments:    Smokes cigar when playing golf quit smoking cigarettes in 2004   Vaping Use   Vaping Use: Never used  Substance and Sexual Activity   Alcohol use: Yes    Alcohol/week: 1.0 - 2.0 standard drink    Types: 1 - 2 Cans of beer per week   Drug use: No   Sexual activity: Yes  Other Topics Concern   Not on file  Social History Narrative   Not on file   Social Determinants of Health   Financial Resource Strain: Not on file  Food Insecurity: Not on file  Transportation Needs: Not on file  Physical Activity: Not on file  Stress: Not on file   Social Connections: Not on file  Intimate Partner Violence: Not on file    Review of Systems 13 point review of systems per patient health survey noted.  Negative other than as indicated above or in HPI.    Objective:   Vitals:   02/05/21 0800  BP: 114/78  Pulse: 60  Resp: 16  Temp: 98.3 F (36.8 C)  TempSrc: Temporal  SpO2: 97%  Weight: 276 lb 6.4 oz (125.4 kg)  Height: 6\' 3"  (1.905 m)     Physical Exam Vitals reviewed.  Constitutional:      Appearance:  He is well-developed.  HENT:     Head: Normocephalic and atraumatic.  Neck:     Vascular: No carotid bruit or JVD.  Cardiovascular:     Rate and Rhythm: Normal rate and regular rhythm.     Heart sounds: Normal heart sounds. No murmur heard. Pulmonary:     Effort: Pulmonary effort is normal.     Breath sounds: Normal breath sounds. No rales.  Musculoskeletal:     Right lower leg: No edema.     Left lower leg: No edema.  Skin:    General: Skin is warm and dry.  Neurological:     Mental Status: He is alert and oriented to person, place, and time.  Psychiatric:        Mood and Affect: Mood normal.       Assessment & Plan:  Scott [name] is a 60 y.o. male . Annual physical exam - Plan: CBC with Differential/Platelet, Lipid panel, Hemoglobin A1c, rosuvastatin (CRESTOR) 40 MG tablet, tadalafil (CIALIS) 5 MG tablet, Comprehensive metabolic panel  - -anticipatory guidance as below in AVS, screening labs above. Health maintenance items as above in HPI discussed/recommended as applicable.   -Discussed breakfast each day, watching food portions, continue exercise for obesity.  Hyperlipidemia, unspecified hyperlipidemia type - Plan: Lipid panel, rosuvastatin (CRESTOR) 40 MG tablet, Comprehensive metabolic panel  -  Stable, tolerating current regimen. Medications refilled. Labs pending as above.   Erectile dysfunction, unspecified erectile dysfunction type - Plan: tadalafil (CIALIS) 5 MG tablet  -  Rx given -  use lowest effective dose. Side effects discussed (including but not limited to headache/flushing, blue discoloration of vision, possible vascular steal and risk of cardiac effects if underlying unknown coronary artery disease, and permanent sensorineural hearing loss). Understanding expressed.  Hyperglycemia - Plan: Hemoglobin A1c, Comprehensive metabolic panel  Screening, anemia, deficiency, iron - Plan: CBC with Differential/Platelet  Vitamin D deficiency - Plan: Vitamin D (25 hydroxy) Check levels, may need to increase dosing     Meds ordered this encounter  Medications   rosuvastatin (CRESTOR) 40 MG tablet    Sig: Take 1 tablet (40 mg total) by mouth daily.    Dispense:  90 tablet    Refill:  3   tadalafil (CIALIS) 5 MG tablet    Sig: Take 1 tablet (5 mg total) by mouth daily as needed.    Dispense:  30 tablet    Refill:  11   Patient Instructions  [facility] is now offering the bivalent Covid booster vaccine. To schedule an appointment or to find more information, visit [email or web address] You can also call [phone], Monday through Friday, 7 a.m. to 7 p.m.  Keep up the good work with walking, exercise.  Some form of breakfast each morning may help with metabolism and weight loss.  Continue to watch diet, food choices and portions.  Thanks for coming in today.  Recheck in 6 months but let me know if there are questions sooner.  Preventive Care 73-83 Years Old, Male Preventive care refers to lifestyle choices and visits with your health care provider that can promote health and wellness. Preventive care visits are also called wellness exams. What can I expect for my preventive care visit? Counseling During your preventive care visit, your health care provider may ask about your: Medical history, including: Past medical problems. Family medical history. Current health, including: Emotional well-being. Home life and relationship well-being. Sexual  activity. Lifestyle, including: Alcohol, nicotine or tobacco, and drug use. Access to firearms. Diet,  exercise, and sleep habits. Safety issues such as seatbelt and bike helmet use. Sunscreen use. Work and work Statistician. Physical exam Your health care provider will check your: Height and weight. These may be used to calculate your BMI (body mass index). BMI is a measurement that tells if you are at a healthy weight. Waist circumference. This measures the distance around your waistline. This measurement also tells if you are at a healthy weight and may help predict your risk of certain diseases, such as type 2 diabetes and high blood pressure. Heart rate and blood pressure. Body temperature. Skin for abnormal spots. What immunizations do I need? Vaccines are usually given at various ages, according to a schedule. Your health care provider will recommend vaccines for you based on your age, medical history, and lifestyle or other factors, such as travel or where you work. What tests do I need? Screening Your health care provider may recommend screening tests for certain conditions. This may include: Lipid and cholesterol levels. Diabetes screening. This is done by checking your blood sugar (glucose) after you have not eaten for a while (fasting). Hepatitis B test. Hepatitis C test. HIV (human immunodeficiency virus) test. STI (sexually transmitted infection) testing, if you are at risk. Lung cancer screening. Prostate cancer screening. Colorectal cancer screening. Talk with your health care provider about your test results, treatment options, and if necessary, the need for more tests. Follow these instructions at home: Eating and drinking  Eat a diet that includes fresh fruits and vegetables, whole grains, lean protein, and low-fat dairy products. Take vitamin and mineral supplements as recommended by your health care provider. Do not drink alcohol if your health care provider  tells you not to drink. If you drink alcohol: Limit how much you have to 0-2 drinks a day. Know how much alcohol is in your drink. In the [place] one drink equals one 12 oz bottle of beer (355 mL), one 5 oz glass of wine (148 mL), or one 1 oz glass of hard liquor (44 mL). Lifestyle Brush your teeth every morning and night with fluoride toothpaste. Floss one time each day. Exercise for at least 30 minutes 5 or more days each week. Do not use any products that contain nicotine or tobacco. These products include cigarettes, chewing tobacco, and vaping devices, such as e-cigarettes. If you need help quitting, ask your health care provider. Do not use drugs. If you are sexually active, practice safe sex. Use a condom or other form of protection to prevent STIs. Take aspirin only as told by your health care provider. Make sure that you understand how much to take and what form to take. Work with your health care provider to find out whether it is safe and beneficial for you to take aspirin daily. Find healthy ways to manage stress, such as: Meditation, yoga, or listening to music. Journaling. Talking to a trusted person. Spending time with friends and family. Minimize exposure to UV radiation to reduce your risk of skin cancer. Safety Always wear your seat belt while driving or riding in a vehicle. Do not drive: If you have been drinking alcohol. Do not ride with someone who has been drinking. When you are tired or distracted. While texting. If you have been using any mind-altering substances or drugs. Wear a helmet and other protective equipment during sports activities. If you have firearms in your house, make sure you follow all gun safety procedures. What's next? Go to your health care provider once a year for  an annual wellness visit. Ask your health care provider how often you should have your eyes and teeth checked. Stay up to date on all vaccines. This information is not intended to  replace advice given to you by your health care provider. Make sure you discuss any questions you have with your health care provider. Document Revised: 09/10/2020 Document Reviewed: 09/10/2020 Elsevier Patient Education  2022 [place],   Merri Ray, MD [place] 02/05/21 8:49 AM

## 2021-02-05 NOTE — Progress Notes (Signed)
PATIENT: PRAJWAL FELLNER DOB: [birth year]  REASON FOR VISIT: follow up HISTORY FROM: patient  HISTORY OF PRESENT ILLNESS: Today 02/09/21:  Mr. Wegner is a 60 year old male with a history of obstructive sleep apnea on CPAP.  He returns today for follow-up.  He reports that the CPAP is working well.  He denies any new issues.  He returns today for an evaluation.    02/04/20: Mr. Wagster is a 60 year old male with a history of obstructive sleep apnea on CPAP.  His download indicates that he uses machine nightly for compliance of 100%.  He uses machine greater than 4 hours each night.  On average he uses his machine 7 hours and 22 minutes.  His residual AHI is 1.4 on 9 cm of water with EPR of 3.  Leak in the 95th percentile is 18.6 L/min.  Overall he feels that he is doing well.  He reports that sometimes he falls asleep in his chair.  He notes that when he wakes up sometimes he feels a little  confused and his wife has noticed his balance is off after the naps.  The patient states this is because his legs get stiff.  He does not use a CPAP when he naps.  He returns today for evaluation.  HISTORY 11/14/18:   Mr. Davitt is a 60 year old male with a history of obstructive sleep apnea on CPAP.  He returns today for follow-up.  His CPAP download indicates that he uses machine 30 out of 30 days for compliance of 100%.  He uses machine greater than 4 hours each night.  On average he uses his machine 7 hours and 20 minutes.  His residual AHI is 1.3 on 9 cm of water with EPR 3.  His leak in the 95th percentile is 25 L/min.  Overall he reports that he is doing well.  He denies any new symptoms.  He returns today for an evaluation.  REVIEW OF SYSTEMS: Out of a complete 14 system review of symptoms, the patient complains only of the following symptoms, and all other reviewed systems are negative.   ESS 3  ALLERGIES: No Known Allergies  HOME MEDICATIONS: Outpatient Medications Prior to  Visit  Medication Sig Dispense Refill   Ascorbic Acid (VITAMIN C) 1000 MG tablet Take 1,000 mg by mouth daily.     Cholecalciferol 25 MCG (1000 UT) tablet Take by mouth.     rosuvastatin (CRESTOR) 40 MG tablet Take 1 tablet (40 mg total) by mouth daily. 90 tablet 3   tadalafil (CIALIS) 5 MG tablet Take 1 tablet (5 mg total) by mouth daily as needed. 30 tablet 11   valACYclovir (VALTREX) 500 MG tablet Take 1 tablet (500 mg total) by mouth 2 (two) times daily. For 3 days at time of flair. 12 tablet 3   vitamin k 100 MCG tablet Take 100 mcg by mouth daily.     zinc sulfate 220 (50 Zn) MG capsule Take 220 mg by mouth daily.     No facility-administered medications prior to visit.    PAST MEDICAL HISTORY: Past Medical History:  Diagnosis Date   Asperger's syndrome    Never formally diagnosed - potential traits of   Herpes simplex type II infection    Hyperlipidemia    Malignant neoplasm of prostate ([place])    Multiple lipomas    Premature ejaculation 07/2004   Pulmonary embolism ([place]) 04/2009   Unspecified vitamin D deficiency     PAST SURGICAL HISTORY: Past Surgical History:  Procedure Laterality Date   CATARACT EXTRACTION, BILATERAL     EYE SURGERY N/A    [facility] 02/01/2020   Hip repalcement  2012   JOINT REPLACEMENT N/A    [facility] 02/01/2020   KNEE ARTHROSCOPY W/ MENISCAL REPAIR Left 12/2014   PROSTATE SURGERY N/A    [facility] 02/01/2020   replacment     Lt hip Feb 2022   SHOULDER OPEN ROTATOR CUFF REPAIR     XI ROBOTIC ASSISTED SIMPLE PROSTATECTOMY  05/21/2009    FAMILY HISTORY: Family History  Problem Relation Age of Onset   Diabetes Mother    Hypertension Mother    Rheum arthritis Mother    Cancer Father    Heart failure Father        "heart trouble"   Heart disease Other    Hyperlipidemia Other    Diabetes Other    Sleep apnea Neg Hx     SOCIAL HISTORY: Social History   Socioeconomic History   Marital status: Married    Spouse name: Engineer, site   Number  of children: 2   Years of education: 18   Highest education level: Master's degree (e.g., MA, MS, MEng, MEd, MSW, MBA)  Occupational History   Not on file  Tobacco Use   Smoking status: Light Smoker    Types: Cigars   Smokeless tobacco: Never   Tobacco comments:    Smokes cigar when playing golf quit smoking cigarettes in 2004   Vaping Use   Vaping Use: Never used  Substance and Sexual Activity   Alcohol use: Yes    Alcohol/week: 1.0 - 2.0 standard drink    Types: 1 - 2 Cans of beer per week   Drug use: No   Sexual activity: Yes  Other Topics Concern   Not on file  Social History Narrative   Not on file   Social Determinants of Health   Financial Resource Strain: Not on file  Food Insecurity: Not on file  Transportation Needs: Not on file  Physical Activity: Not on file  Stress: Not on file  Social Connections: Not on file  Intimate Partner Violence: Not on file      PHYSICAL EXAM  Vitals:   02/09/21 0821  BP: 123/78  Pulse: 66  Weight: 281 lb 6.4 oz (127.6 kg)  Height: 6\' 3"  (1.905 m)   Body mass index is 35.17 kg/m.  Generalized: Well developed, in no acute distress  Chest: Lungs clear to auscultation bilaterally  Neurological examination  Mentation: Alert oriented to time, place, history taking. Follows all commands speech and language fluent Cranial nerve II-XII: Extraocular movements were full, visual field were full on confrontational test Head turning and shoulder shrug  were normal and symmetric. Motor: The motor testing reveals 5 over 5 strength of all 4 extremities. Good symmetric motor tone is noted throughout.  Sensory: Sensory testing is intact to soft touch on all 4 extremities. No evidence of extinction is noted.  Gait and station: Gait is normal.    DIAGNOSTIC DATA (LABS, IMAGING, TESTING) - I reviewed patient records, labs, notes, testing and imaging myself where available.  Lab Results  Component Value Date   WBC 4.5 02/05/2021   HGB  14.3 02/05/2021   HCT 42.4 02/05/2021   MCV 93.3 02/05/2021   PLT 151.0 02/05/2021      Component Value Date/Time   NA 139 02/05/2021 0856   NA 140 04/25/2020 1156   K 4.7 02/05/2021 0856   CL 104 02/05/2021 0856  CO2 26 02/05/2021 0856   GLUCOSE 98 02/05/2021 0856   BUN 15 02/05/2021 0856   BUN 9 04/25/2020 1156   CREATININE 0.88 02/05/2021 0856   CREATININE 0.86 07/10/2015 1056   CALCIUM 9.3 02/05/2021 0856   PROT 6.9 02/05/2021 0856   PROT 7.0 04/25/2020 1156   ALBUMIN 4.5 02/05/2021 0856   ALBUMIN 4.6 04/25/2020 1156   AST 16 02/05/2021 0856   ALT 15 02/05/2021 0856   ALKPHOS 65 02/05/2021 0856   BILITOT 0.6 02/05/2021 0856   BILITOT 0.6 04/25/2020 1156   GFRNONAA 89 04/25/2020 1156   GFRNONAA >89 07/10/2015 1056   GFRAA 103 04/25/2020 1156   GFRAA >89 07/10/2015 1056   Lab Results  Component Value Date   CHOL 211 (H) 02/05/2021   HDL 43.50 02/05/2021   LDLCALC 156 (H) 02/05/2021   TRIG 55.0 02/05/2021   CHOLHDL 5 02/05/2021   Lab Results  Component Value Date   HGBA1C 5.7 02/05/2021   No results found for: [id number] Lab Results  Component Value Date   TSH 3.270 03/13/2020      ASSESSMENT AND PLAN 60 y.o. year old male  has a past medical history of Asperger's syndrome, Herpes simplex type II infection, Hyperlipidemia, Malignant neoplasm of prostate ([place]), Multiple lipomas, Premature ejaculation (07/2004), Pulmonary embolism ([place]) (04/2009), and Unspecified vitamin D deficiency. here with:  OSA on CPAP  - CPAP compliance excellent - Good treatment of AHI  -Advised patient he can also use his CPAP during naps. - Encourage patient to use CPAP nightly and > 4 hours each night - F/U in 1 year or sooner if needed   Ward Givens, MSN, NP-C 02/09/2021, 8:36 AM [facility] [address] [place], [place] [phone]

## 2021-02-09 ENCOUNTER — Ambulatory Visit: Payer: BC Managed Care – PPO | Admitting: Adult Health

## 2021-02-09 ENCOUNTER — Encounter: Payer: Self-pay | Admitting: Adult Health

## 2021-02-09 VITALS — BP 123/78 | HR 66 | Ht 75.0 in | Wt 281.4 lb

## 2021-02-09 DIAGNOSIS — G4733 Obstructive sleep apnea (adult) (pediatric): Secondary | ICD-10-CM | POA: Diagnosis not present

## 2021-02-09 DIAGNOSIS — Z9989 Dependence on other enabling machines and devices: Secondary | ICD-10-CM

## 2021-02-09 NOTE — Patient Instructions (Signed)
Continue using CPAP nightly and greater than 4 hours each night °If your symptoms worsen or you develop new symptoms please let us know.  ° °

## 2021-02-25 DIAGNOSIS — G4733 Obstructive sleep apnea (adult) (pediatric): Secondary | ICD-10-CM | POA: Diagnosis not present

## 2021-03-11 ENCOUNTER — Other Ambulatory Visit: Payer: Self-pay | Admitting: Plastic Surgery

## 2021-03-11 DIAGNOSIS — L720 Epidermal cyst: Secondary | ICD-10-CM | POA: Diagnosis not present

## 2021-03-11 DIAGNOSIS — D481 Neoplasm of uncertain behavior of connective and other soft tissue: Secondary | ICD-10-CM | POA: Diagnosis not present

## 2021-03-18 ENCOUNTER — Other Ambulatory Visit: Payer: Self-pay | Admitting: Plastic Surgery

## 2021-03-18 DIAGNOSIS — D1721 Benign lipomatous neoplasm of skin and subcutaneous tissue of right arm: Secondary | ICD-10-CM | POA: Diagnosis not present

## 2021-03-18 DIAGNOSIS — D481 Neoplasm of uncertain behavior of connective and other soft tissue: Secondary | ICD-10-CM | POA: Diagnosis not present

## 2021-07-13 ENCOUNTER — Ambulatory Visit: Payer: BC Managed Care – PPO | Admitting: Medical

## 2021-07-13 ENCOUNTER — Encounter: Payer: Self-pay | Admitting: Medical

## 2021-07-13 VITALS — BP 130/82 | HR 72 | Temp 98.6°F | Resp 16

## 2021-07-13 DIAGNOSIS — S90121A Contusion of right lesser toe(s) without damage to nail, initial encounter: Secondary | ICD-10-CM

## 2021-07-13 DIAGNOSIS — K047 Periapical abscess without sinus: Secondary | ICD-10-CM

## 2021-07-13 MED ORDER — AMOXICILLIN-POT CLAVULANATE 875-125 MG PO TABS: 1.0000 | ORAL_TABLET | Freq: Two times a day (BID) | ORAL | 0 refills | Status: DC

## 2021-07-13 NOTE — Progress Notes (Addendum)
? ?  Subjective:  ? ? Patient ID: Scott [name], male    DOB: July 22, 1960, 61 y.o.   MRN: [id number] ? ?HPI ?61 yo male in non acute distress presents today with swelling to left side of face, noticed while he was shaving. ? ?He had upper left tooth molar bridge repaired by dentist last week. ? ?Blood pressure 130/82, pulse 72, temperature 98.6 ?F (37 ?C), temperature source Tympanic, resp. rate 16, SpO2 96 %. ? ?No Known Allergies ? ? ?Review of Systems  ?Constitutional:  Negative for chills and fever.  ?HENT:  Positive for dental problem.   ? ?   ?Objective:  ? Physical Exam ? ?Slightly tender on the lingual side of the left lower  back molar.No obvious abscess  or gumline swelling noted. No erythema to the face. ? ?5th right toe with swelling and bruising, mild discomfort with walking. Buddty taped toe to  4th toe. Return in one week if pain is not improving.FROM. ? ?   ?Assessment & Plan:  ?Abscess left lower side molar ?Contusion to  5th right toe. ?Meds ordered this encounter  ?Medications  ? amoxicillin-clavulanate (AUGMENTIN) 875-125 MG tablet  ?  Sig: Take 1 tablet by mouth 2 (two) times daily. Take with food.  ?  Dispense:  20 tablet  ?  Refill:  0  ? Return in if worsening. ?Patient verbalizes understanding and has no questions at discharge. ?

## 2021-07-13 NOTE — Patient Instructions (Signed)
Dental Abscess  A dental abscess is an area of pus in or around a tooth. It comes from an infection. It can cause pain and other symptoms. Treatment will help with symptoms and prevent the infection from spreading. What are the causes? This condition is caused by an infection in or around the tooth. This can be from: Very bad tooth decay (cavities). A bad injury to the tooth, such as a broken or chipped tooth. What increases the risk? The risk to get an abscess is higher in males. It is also more likely in people who: Have dental decay. Have very bad gum disease. Eat sugary snacks between meals. Use tobacco. Have diabetes. Have a weak disease-fighting system (immune system). Do not brush their teeth regularly. What are the signs or symptoms? Some mild symptoms are: Tenderness. Bad breath. Fever. A sharp, sour taste in the mouth. Pain in and around the infected tooth. Worse symptoms of this condition include: Swollen neck glands. Chills. Pus draining around the tooth. Swelling and redness around the tooth, the mouth, or the face. Very bad pain in and around the tooth. The worst symptoms can include: Difficulty swallowing. Difficulty opening your mouth. Feeling like you may vomit or vomiting. How is this treated? This is treated by getting rid of the infection. Your dentist will discuss ways to do this, including: Antibiotic medicines. Antibacterial mouth rinse. An incision in the abscess to drain out the pus. A root canal. Removing the tooth. Follow these instructions at home: Medicines Take over-the-counter and prescription medicines only as told by your dentist. If you were prescribed an antibiotic medicine, take it as told by your dentist. Do not stop taking it even if you start to feel better. If you were prescribed a gel that has numbing medicine in it, use it exactly as told. Ask your dentist if you should avoid driving or using machines while you are taking your  medicine. General instructions Rinse your mouth often with salt water. To make salt water, dissolve -1 tsp (3-6 g) of salt in 1 cup (237 mL) of warm water. Eat a soft diet while your mouth is healing. Drink enough fluid to keep your pee (urine) pale yellow. Do not apply heat to the outside of your mouth. Do not smoke or use any products that contain nicotine or tobacco. If you need help quitting, ask your dentist. Keep all follow-up visits. Prevent an abscess Brush your teeth every morning and every night. Use fluoride toothpaste. Floss your teeth each day. Get dental cleanings as often as told by your dentist. Think about getting dental sealant put on teeth that have deep holes (decay). Drink water that has fluoride in it. Most tap water has fluoride. Check the label on bottled water to see if it has fluoride in it. Drink water instead of sugary drinks. Eat healthy meals and snacks. Wear a mouth guard or face shield when you play sports. Contact a doctor if: Your pain is worse and medicine does not help. Get help right away if: You have a fever or chills. Your symptoms suddenly get worse. You have a very bad headache. You have problems breathing or swallowing. You have trouble opening your mouth. You have swelling in your neck or close to your eye. These symptoms may be an emergency. Get help right away. Call your local emergency services (911 in the [place] Do not wait to see if the symptoms will go away. Do not drive yourself to the hospital. Summary A dental abscess   is an area of pus in or around a tooth. It is caused by an infection. Treatment will help with symptoms and prevent the infection from spreading. Take over-the-counter and prescription medicines only as told by your dentist. To prevent an abscess, take good care of your teeth. Brush your teeth every morning and night. Use floss every day. Get dental cleanings as often as told by your dentist. This information is  not intended to replace advice given to you by your health care provider. Make sure you discuss any questions you have with your health care provider. Document Revised: 05/22/2020 Document Reviewed: 05/22/2020 Elsevier Patient Education  2023 Elsevier Inc.  

## 2021-08-06 ENCOUNTER — Ambulatory Visit: Payer: BC Managed Care – PPO | Admitting: Family Medicine

## 2021-08-06 VITALS — BP 126/80 | HR 66 | Temp 98.2°F | Resp 17 | Ht 75.0 in | Wt 276.6 lb

## 2021-08-06 DIAGNOSIS — N529 Male erectile dysfunction, unspecified: Secondary | ICD-10-CM | POA: Diagnosis not present

## 2021-08-06 DIAGNOSIS — E782 Mixed hyperlipidemia: Secondary | ICD-10-CM | POA: Diagnosis not present

## 2021-08-06 DIAGNOSIS — L989 Disorder of the skin and subcutaneous tissue, unspecified: Secondary | ICD-10-CM | POA: Diagnosis not present

## 2021-08-06 DIAGNOSIS — E559 Vitamin D deficiency, unspecified: Secondary | ICD-10-CM | POA: Diagnosis not present

## 2021-08-06 LAB — COMPREHENSIVE METABOLIC PANEL
ALT: 15 U/L (ref 0–53)
AST: 16 U/L (ref 0–37)
Albumin: 4.3 g/dL (ref 3.5–5.2)
Alkaline Phosphatase: 63 U/L (ref 39–117)
BUN: 16 mg/dL (ref 6–23)
CO2: 27 mEq/L (ref 19–32)
Calcium: 9 mg/dL (ref 8.4–10.5)
Chloride: 104 mEq/L (ref 96–112)
Creatinine, Ser: 0.83 mg/dL (ref 0.40–1.50)
GFR: 94.17 mL/min (ref >60.00)
Glucose, Bld: 97 mg/dL (ref 70–99)
Potassium: 4.5 mEq/L (ref 3.5–5.1)
Sodium: 138 mEq/L (ref 135–145)
Total Bilirubin: 0.7 mg/dL (ref 0.2–1.2)
Total Protein: 6.6 g/dL (ref 6.0–8.3)

## 2021-08-06 LAB — LIPID PANEL
Cholesterol: 210 mg/dL — ABNORMAL HIGH (ref 0–200)
HDL: 45.7 mg/dL (ref >39.00)
LDL Cholesterol: 145 mg/dL — ABNORMAL HIGH (ref 0–99)
NonHDL: 164.08
Total CHOL/HDL Ratio: 5
Triglycerides: 93 mg/dL (ref 0.0–149.0)
VLDL: 18.6 mg/dL (ref 0.0–40.0)

## 2021-08-06 LAB — VITAMIN D 25 HYDROXY (VIT D DEFICIENCY, FRACTURES): VITD: 33.31 ng/mL (ref 30.00–100.00)

## 2021-08-06 NOTE — Progress Notes (Signed)
? ?Subjective:  ?Patient ID: Scott [name], male    DOB: [birth year]  Age: 61 y.o. MRN: [id number] ? ?CC:  ?Chief Complaint  ?Patient presents with  ? Hyperlipidemia  ?  Pt due for recheck on labs, no concerns  ? Erectile Dysfunction  ?  Pt here for recheck no concerns, no refills needed at this time   ? vitamin D  ?  Due for recheck   ? ? ?HPI ?Scott [name] presents for  ? ?Hyperlipidemia: ?Crestor 40 mg daily, no new side effects or myalgias. Some missed doses prior to last readings. No recent missed doses.   ?Lab Results  ?Component Value Date  ? CHOL 211 (H) 02/05/2021  ? HDL 43.50 02/05/2021  ? LDLCALC 156 (H) 02/05/2021  ? TRIG 55.0 02/05/2021  ? CHOLHDL 5 02/05/2021  ? ?Lab Results  ?Component Value Date  ? ALT 15 02/05/2021  ? AST 16 02/05/2021  ? ALKPHOS 65 02/05/2021  ? BILITOT 0.6 02/05/2021  ? ?Erectile dysfunction ?Treated with Cialis 5 mg - 3 times per week. Effective.  ?No hearing/vision changes or CP with exertion. No back pain.  ? ?Vitamin D deficiency ?Last vitamin D ?Lab Results  ?Component Value Date  ? VD25OH 29.08 (L) 02/05/2021  ?Daily supplement recommended at his last labs in November.  Currently taking otc vit D few times per week - unknown dose.  ? ?Things are going well. Busy at work - May is fiscal year end. Busy time. [place] and [place] trips panned this summer.  ? ?Dent/crevice on scalp ?Noted by barber, no pain. Not mentioned prior. Has appt with derm next week.  ? ?History ?Patient Active Problem List  ? Diagnosis Date Noted  ? OSA on CPAP 03/11/2017  ? Obesity 07/10/2015  ? Hyperlipidemia   ? Unspecified vitamin D deficiency   ? Herpes simplex type II infection   ? ?Past Medical History:  ?Diagnosis Date  ? Asperger's syndrome   ? Never formally diagnosed - potential traits of  ? Herpes simplex type II infection   ? Hyperlipidemia   ? Malignant neoplasm of prostate ([place])   ? Multiple lipomas   ? Premature ejaculation 07/2004  ? Pulmonary embolism ([place])  04/2009  ? Unspecified vitamin D deficiency   ? ?Past Surgical History:  ?Procedure Laterality Date  ? CATARACT EXTRACTION, BILATERAL    ? EYE SURGERY N/A   ? [facility] 02/01/2020  ? Hip repalcement  2012  ? JOINT REPLACEMENT N/A   ? [facility] 02/01/2020  ? KNEE ARTHROSCOPY W/ MENISCAL REPAIR Left 12/2014  ? PROSTATE SURGERY N/A   ? [facility] 02/01/2020  ? replacment    ? Lt hip Feb 2022  ? SHOULDER OPEN ROTATOR CUFF REPAIR    ? XI ROBOTIC ASSISTED SIMPLE PROSTATECTOMY  05/21/2009  ? ?No Known Allergies ?Prior to Admission medications   ?Medication Sig Start Date End Date Taking? Authorizing Provider  ?Ascorbic Acid (VITAMIN C) 1000 MG tablet Take 1,000 mg by mouth daily.   Yes [provider]  ?Cholecalciferol 25 MCG (1000 UT) tablet Take by mouth.   Yes [provider]  ?rosuvastatin (CRESTOR) 40 MG tablet Take 1 tablet (40 mg total) by mouth daily. 02/05/21  Yes [name], MD  ?tadalafil (CIALIS) 5 MG tablet Take 1 tablet (5 mg total) by mouth daily as needed. 02/05/21  Yes [name], MD  ?valACYclovir (VALTREX) 500 MG tablet Take 1 tablet (500 mg total) by mouth 2 (two) times  daily. For 3 days at time of flair. 08/04/20  Yes [name], MD  ?vitamin k 100 MCG tablet Take 100 mcg by mouth daily.   Yes [provider]  ?zinc sulfate 220 (50 Zn) MG capsule Take 220 mg by mouth daily.   Yes [provider]  ?amoxicillin-clavulanate (AUGMENTIN) 875-125 MG tablet Take 1 tablet by mouth 2 (two) times daily. Take with food. ?Patient not taking: Reported on 08/06/2021 07/13/21   Talmage Nap, PA-C  ? ?Social History  ? ?Socioeconomic History  ? Marital status: Married  ?  Spouse name: [name]  ? Number of children: 2  ? Years of education: 29  ? Highest education level: Master's degree (e.g., MA, MS, MEng, MEd, MSW, MBA)  ?Occupational History  ? Not on file  ?Tobacco Use  ? Smoking status: Light Smoker  ?  Types: Cigars  ? Smokeless tobacco: Never  ? Tobacco  comments:  ?  Smokes cigar when playing golf quit smoking cigarettes in 2004   ?Vaping Use  ? Vaping Use: Never used  ?Substance and Sexual Activity  ? Alcohol use: Yes  ?  Alcohol/week: 1.0 - 2.0 standard drink  ?  Types: 1 - 2 Cans of beer per week  ? Drug use: No  ? Sexual activity: Yes  ?Other Topics Concern  ? Not on file  ?Social History Narrative  ? Not on file  ? ?Social Determinants of Health  ? ?Financial Resource Strain: Not on file  ?Food Insecurity: Not on file  ?Transportation Needs: Not on file  ?Physical Activity: Not on file  ?Stress: Not on file  ?Social Connections: Not on file  ?Intimate Partner Violence: Not on file  ? ?Review of Systems  ?Constitutional:  Negative for fatigue and unexpected weight change.  ?Eyes:  Negative for visual disturbance.  ?Respiratory:  Negative for cough, chest tightness and shortness of breath.   ?Cardiovascular:  Negative for chest pain, palpitations and leg swelling.  ?Gastrointestinal:  Negative for abdominal pain and blood in stool.  ?Neurological:  Negative for dizziness, light-headedness and headaches.  ? ? ?Objective:  ? ?Vitals:  ? 08/06/21 0847  ?BP: 126/80  ?Pulse: 66  ?Resp: 17  ?Temp: 98.2 ?F (36.8 ?C)  ?TempSrc: Temporal  ?SpO2: 96%  ?Weight: 276 lb 9.6 oz (125.5 kg)  ?Height: '6\' 3"'$  (1.905 m)  ? ? ? ?Physical Exam ?Vitals reviewed.  ?Constitutional:   ?   Appearance: He is well-developed.  ?HENT:  ?   Head: Normocephalic and atraumatic.  ?Neck:  ?   Vascular: No carotid bruit or JVD.  ?Cardiovascular:  ?   Rate and Rhythm: Normal rate and regular rhythm.  ?   Heart sounds: Normal heart sounds. No murmur heard. ?Pulmonary:  ?   Effort: Pulmonary effort is normal.  ?   Breath sounds: Normal breath sounds. No rales.  ?Musculoskeletal:  ?   Right lower leg: No edema.  ?   Left lower leg: No edema.  ?Skin: ?   General: Skin is warm and dry.  ?   Comments: Sulcus on R temporal scalp. No rash, no wounds.   ?Neurological:  ?   Mental Status: He is alert and  oriented to person, place, and time.  ?Psychiatric:     ?   Mood and Affect: Mood normal.  ?   ? ? ? ? ?Assessment & Plan:  ?Scott [name] is a 61 y.o. male . ?Vitamin D deficiency - Plan: Comprehensive metabolic  panel, VITAMIN D 25 Hydroxy (Vit-D Deficiency, Fractures) ? -Over-the-counter supplementation recommended, check labs. ? ?Mixed hyperlipidemia - Plan: Comprehensive metabolic panel, Lipid panel ? -Tolerating Crestor, last labs may have been elevated with intermittent dosing, missed doses but denies recent missed doses of meds.  Updated labs ordered.  No change in Crestor dose for now ? ?Erectile dysfunction, unspecified erectile dysfunction type ? -Stable with intermittent Cialis.  Dosing options discussed.  Potential side effects discussed. ? ?Skin abnormality ? -Sulcus/ridge on right temporal scalp.  No wounds.  On further discussion does not member any specific injury to that area.  Does have upcoming appointment with dermatology in the next week.  Asked that he discuss that area at that visit. ? ?No orders of the defined types were placed in this encounter. ? ?Patient Instructions  ?Take 1000-2000 units vitamin D per day. I will check levels and other labs. No med changes today. Ask your dermatologist about the ridge on your scalp at upcoming appointment.  ?Enjoy your vacations this summer.  ?Physical in 6 months.  ? ? ? ? ? ? ? ?Signed,  ? ?Merri Ray, MD ?[place] ?[facility] ?08/06/21 ?9:27 AM ? ? ?

## 2021-08-06 NOTE — Patient Instructions (Addendum)
Take 1000-2000 units vitamin D per day. I will check levels and other labs. No med changes today. Ask your dermatologist about the ridge on your scalp at upcoming appointment.  ?Enjoy your vacations this summer.  ?Physical in 6 months.  ? ? ? ? ?

## 2021-08-13 DIAGNOSIS — L821 Other seborrheic keratosis: Secondary | ICD-10-CM | POA: Diagnosis not present

## 2021-08-13 DIAGNOSIS — D225 Melanocytic nevi of trunk: Secondary | ICD-10-CM | POA: Diagnosis not present

## 2021-08-13 DIAGNOSIS — L814 Other melanin hyperpigmentation: Secondary | ICD-10-CM | POA: Diagnosis not present

## 2021-08-13 DIAGNOSIS — L820 Inflamed seborrheic keratosis: Secondary | ICD-10-CM | POA: Diagnosis not present

## 2021-08-13 DIAGNOSIS — L989 Disorder of the skin and subcutaneous tissue, unspecified: Secondary | ICD-10-CM | POA: Diagnosis not present

## 2021-08-13 DIAGNOSIS — D485 Neoplasm of uncertain behavior of skin: Secondary | ICD-10-CM | POA: Diagnosis not present

## 2021-11-30 ENCOUNTER — Encounter: Payer: Self-pay | Admitting: Physician Assistant

## 2021-11-30 ENCOUNTER — Ambulatory Visit (INDEPENDENT_AMBULATORY_CARE_PROVIDER_SITE_OTHER): Payer: Self-pay | Admitting: Physician Assistant

## 2021-11-30 VITALS — BP 138/90 | HR 66 | Temp 98.3°F | Ht 75.0 in | Wt 276.2 lb

## 2021-11-30 DIAGNOSIS — H6121 Impacted cerumen, right ear: Secondary | ICD-10-CM

## 2021-11-30 NOTE — Progress Notes (Signed)
[facility] [address], [place] [address]   Office Visit Note  Patient Name: Scott [name] Date of Birth [id number]  Medical Record number [id number]  Date of Service: 11/30/2021  Chief Complaint  Patient presents with   Ear Fullness    Right ear wax. Tried OTC drops and didn't do anything. Feels clogged and hard to hear sometimes, does not hurt. x1 month.      61 y/o M presents to the clinic for c/o R ear "fullness" x 1 month. Pt denies any ear pain, drainage, nausea, vomiting, dizziness. Pt does have h/o cerumen impaction and ear lavage has helped in the past. No recent air travel. Denies any allergies/URI symptoms.   Ear Fullness  Pertinent negatives include no ear discharge or sore throat.   Pt is here for a sick visit.   Current Medication:  Outpatient Encounter Medications as of 11/30/2021  Medication Sig   Ascorbic Acid (VITAMIN C) 1000 MG tablet Take 1,000 mg by mouth daily.   Cholecalciferol 25 MCG (1000 UT) tablet Take by mouth.   rosuvastatin (CRESTOR) 40 MG tablet Take 1 tablet (40 mg total) by mouth daily.   tadalafil (CIALIS) 5 MG tablet Take 1 tablet (5 mg total) by mouth daily as needed.   valACYclovir (VALTREX) 500 MG tablet Take 1 tablet (500 mg total) by mouth 2 (two) times daily. For 3 days at time of flair.   [DISCONTINUED] amoxicillin-clavulanate (AUGMENTIN) 875-125 MG tablet Take 1 tablet by mouth 2 (two) times daily. Take with food. (Patient not taking: Reported on 08/06/2021)   [DISCONTINUED] vitamin k 100 MCG tablet Take 100 mcg by mouth daily. (Patient not taking: Reported on 11/30/2021)   [DISCONTINUED] zinc sulfate 220 (50 Zn) MG capsule Take 220 mg by mouth daily. (Patient not taking: Reported on 11/30/2021)   No facility-administered encounter medications on file as of 11/30/2021.      Medical History: Past Medical History:  Diagnosis Date   Asperger's syndrome    Never formally diagnosed - potential traits of   Herpes  simplex type II infection    Hyperlipidemia    Malignant neoplasm of prostate ([place])    Multiple lipomas    Premature ejaculation 07/2004   Pulmonary embolism ([place]) 04/2009   Unspecified vitamin D deficiency      Vital Signs: BP (!) 138/90 (BP Location: Left Arm, Patient Position: Sitting, Cuff Size: Normal)   Pulse 66   Temp 98.3 F (36.8 C) (Tympanic)   Ht '6\' 3"'$  (1.905 m)   Wt 276 lb 3.2 oz (125.3 kg)   SpO2 98%   BMI 34.52 kg/m    Review of Systems  Constitutional: Negative.   HENT:  Negative for congestion, ear discharge, ear pain, postnasal drip, sinus pressure, sinus pain, sore throat and voice change.   Respiratory: Negative.    Cardiovascular: Negative.   Allergic/Immunologic: Negative.   Psychiatric/Behavioral: Negative.      Physical Exam Constitutional:      Appearance: Normal appearance. He is obese.  HENT:     Head: Normocephalic and atraumatic.     Right Ear: Ear canal and external ear normal. There is impacted cerumen (+excessive cerumen in R ear canal).     Left Ear: Tympanic membrane, ear canal and external ear normal.     Ears:     Comments: Partially occluded R ear. Able to partially visualize R TM and appears normal.   Post ear lavage of R ear canal: R TM appears  normal without sings of infection.    Nose: No congestion.     Mouth/Throat:     Mouth: Mucous membranes are moist.  Eyes:     Extraocular Movements: Extraocular movements intact.  Cardiovascular:     Rate and Rhythm: Normal rate and regular rhythm.  Pulmonary:     Effort: Pulmonary effort is normal. No respiratory distress.     Breath sounds: Normal breath sounds.  Neurological:     Mental Status: He is alert and oriented to person, place, and time.  Psychiatric:        Mood and Affect: Mood normal.       Assessment/Plan: Excessive cerumen in ear canal, right - Plan: EAR CERUMEN REMOVAL  Discussed clinical findings.  Will remove excess cerumen with ear lavage.  If symptoms  don't improve then consider taking otc oral decongestant  ie Sudafed. May consider nasal decongestant ie Afrin for max. Of 3 consecutive days.  Continue to watch for worsening symptoms. RTC if symptoms worsen prior to leaving for your trip.  Pt verbalized understanding and in agreement.     General Counseling: Scott [name] understanding of the findings of todays visit and agrees with plan of treatment. I have discussed any further diagnostic evaluation that may be needed or ordered today. We also reviewed his medications today. he has been encouraged to call the office with any questions or concerns that should arise related to todays visit.   Orders Placed This Encounter  Procedures   EAR CERUMEN REMOVAL       Time spent:20 Minutes    Johnna Acosta, [place] Physician Assistant

## 2022-02-09 ENCOUNTER — Ambulatory Visit: Payer: BC Managed Care – PPO | Admitting: Adult Health

## 2022-02-11 ENCOUNTER — Ambulatory Visit (INDEPENDENT_AMBULATORY_CARE_PROVIDER_SITE_OTHER): Payer: BC Managed Care – PPO | Admitting: Family Medicine

## 2022-02-11 ENCOUNTER — Encounter: Payer: Self-pay | Admitting: Family Medicine

## 2022-02-11 VITALS — BP 128/76 | HR 63 | Temp 98.2°F | Ht 75.5 in | Wt 278.8 lb

## 2022-02-11 DIAGNOSIS — Z8546 Personal history of malignant neoplasm of prostate: Secondary | ICD-10-CM

## 2022-02-11 DIAGNOSIS — Z0000 Encounter for general adult medical examination without abnormal findings: Secondary | ICD-10-CM | POA: Diagnosis not present

## 2022-02-11 DIAGNOSIS — E785 Hyperlipidemia, unspecified: Secondary | ICD-10-CM

## 2022-02-11 DIAGNOSIS — R739 Hyperglycemia, unspecified: Secondary | ICD-10-CM | POA: Diagnosis not present

## 2022-02-11 DIAGNOSIS — N529 Male erectile dysfunction, unspecified: Secondary | ICD-10-CM

## 2022-02-11 DIAGNOSIS — R0981 Nasal congestion: Secondary | ICD-10-CM

## 2022-02-11 LAB — COMPREHENSIVE METABOLIC PANEL
ALT: 24 U/L (ref 0–53)
AST: 21 U/L (ref 0–37)
Albumin: 4.4 g/dL (ref 3.5–5.2)
Alkaline Phosphatase: 65 U/L (ref 39–117)
BUN: 16 mg/dL (ref 6–23)
CO2: 30 mEq/L (ref 19–32)
Calcium: 9.5 mg/dL (ref 8.4–10.5)
Chloride: 102 mEq/L (ref 96–112)
Creatinine, Ser: 0.9 mg/dL (ref 0.40–1.50)
GFR: 91.57 mL/min (ref >60.00)
Glucose, Bld: 98 mg/dL (ref 70–99)
Potassium: 5.3 mEq/L — ABNORMAL HIGH (ref 3.5–5.1)
Sodium: 138 mEq/L (ref 135–145)
Total Bilirubin: 0.6 mg/dL (ref 0.2–1.2)
Total Protein: 6.9 g/dL (ref 6.0–8.3)

## 2022-02-11 LAB — HEMOGLOBIN A1C: Hgb A1c MFr Bld: 5.8 % (ref 4.6–6.5)

## 2022-02-11 LAB — LIPID PANEL
Cholesterol: 195 mg/dL (ref 0–200)
HDL: 45.4 mg/dL (ref >39.00)
LDL Cholesterol: 131 mg/dL — ABNORMAL HIGH (ref 0–99)
NonHDL: 149.15
Total CHOL/HDL Ratio: 4
Triglycerides: 90 mg/dL (ref 0.0–149.0)
VLDL: 18 mg/dL (ref 0.0–40.0)

## 2022-02-11 LAB — PSA: PSA: 0 ng/mL — ABNORMAL LOW (ref 0.10–4.00)

## 2022-02-11 MED ORDER — TADALAFIL 5 MG PO TABS: 5.0000 mg | ORAL_TABLET | Freq: Every day | ORAL | 11 refills | Status: DC | PRN

## 2022-02-11 MED ORDER — ROSUVASTATIN CALCIUM 40 MG PO TABS: 40.0000 mg | ORAL_TABLET | Freq: Every day | ORAL | 3 refills | Status: DC

## 2022-02-11 MED ORDER — IPRATROPIUM BROMIDE 0.06 % NA SOLN: 1.0000 | Freq: Four times a day (QID) | NASAL | 5 refills | Status: DC

## 2022-02-11 NOTE — Patient Instructions (Addendum)
Try otc flonase for the congestion at bedtime. If not effective, I prescribed a different nasal spray. Follow up if not improving.  No other med changes today. I will let you know if there are concerns on labs.   Preventive Care 62-62 Years Old, Male Preventive care refers to lifestyle choices and visits with your health care provider that can promote health and wellness. Preventive care visits are also called wellness exams. What can I expect for my preventive care visit? Counseling During your preventive care visit, your health care provider may ask about your: Medical history, including: Past medical problems. Family medical history. Current health, including: Emotional well-being. Home life and relationship well-being. Sexual activity. Lifestyle, including: Alcohol, nicotine or tobacco, and drug use. Access to firearms. Diet, exercise, and sleep habits. Safety issues such as seatbelt and bike helmet use. Sunscreen use. Work and work Statistician. Physical exam Your health care provider will check your: Height and weight. These may be used to calculate your BMI (body mass index). BMI is a measurement that tells if you are at a healthy weight. Waist circumference. This measures the distance around your waistline. This measurement also tells if you are at a healthy weight and may help predict your risk of certain diseases, such as type 2 diabetes and high blood pressure. Heart rate and blood pressure. Body temperature. Skin for abnormal spots. What immunizations do I need?  Vaccines are usually given at various ages, according to a schedule. Your health care provider will recommend vaccines for you based on your age, medical history, and lifestyle or other factors, such as travel or where you work. What tests do I need? Screening Your health care provider may recommend screening tests for certain conditions. This may include: Lipid and cholesterol levels. Diabetes screening. This  is done by checking your blood sugar (glucose) after you have not eaten for a while (fasting). Hepatitis B test. Hepatitis C test. HIV (human immunodeficiency virus) test. STI (sexually transmitted infection) testing, if you are at risk. Lung cancer screening. Prostate cancer screening. Colorectal cancer screening. Talk with your health care provider about your test results, treatment options, and if necessary, the need for more tests. Follow these instructions at home: Eating and drinking  Eat a diet that includes fresh fruits and vegetables, whole grains, lean protein, and low-fat dairy products. Take vitamin and mineral supplements as recommended by your health care provider. Do not drink alcohol if your health care provider tells you not to drink. If you drink alcohol: Limit how much you have to 0-2 drinks a day. Know how much alcohol is in your drink. In the [place] one drink equals one 12 oz bottle of beer (355 mL), one 5 oz glass of wine (148 mL), or one 1 oz glass of hard liquor (44 mL). Lifestyle Brush your teeth every morning and night with fluoride toothpaste. Floss one time each day. Exercise for at least 30 minutes 5 or more days each week. Do not use any products that contain nicotine or tobacco. These products include cigarettes, chewing tobacco, and vaping devices, such as e-cigarettes. If you need help quitting, ask your health care provider. Do not use drugs. If you are sexually active, practice safe sex. Use a condom or other form of protection to prevent STIs. Take aspirin only as told by your health care provider. Make sure that you understand how much to take and what form to take. Work with your health care provider to find out whether it is safe  and beneficial for you to take aspirin daily. Find healthy ways to manage stress, such as: Meditation, yoga, or listening to music. Journaling. Talking to a trusted person. Spending time with friends and family. Minimize  exposure to UV radiation to reduce your risk of skin cancer. Safety Always wear your seat belt while driving or riding in a vehicle. Do not drive: If you have been drinking alcohol. Do not ride with someone who has been drinking. When you are tired or distracted. While texting. If you have been using any mind-altering substances or drugs. Wear a helmet and other protective equipment during sports activities. If you have firearms in your house, make sure you follow all gun safety procedures. What's next? Go to your health care provider once a year for an annual wellness visit. Ask your health care provider how often you should have your eyes and teeth checked. Stay up to date on all vaccines. This information is not intended to replace advice given to you by your health care provider. Make sure you discuss any questions you have with your health care provider. Document Revised: 09/10/2020 Document Reviewed: 09/10/2020 Elsevier Patient Education  [place].

## 2022-02-11 NOTE — Progress Notes (Signed)
Subjective:  Patient ID: Scott [name], male    DOB: Aug 26, 1960  Age: 61 y.o. MRN: [id number]  CC:  Chief Complaint  Patient presents with   Annual Exam   Nasal Congestion    Pt states he feels like his nasal passages are closed up     HPI Scott [name] presents for Annual Exam  Doing well.   Hyperlipidemia: Crestor '40mg'$  qd. No new side effects/myalgias.   Lab Results  Component Value Date   CHOL 210 (H) 08/06/2021   HDL 45.70 08/06/2021   LDLCALC 145 (H) 08/06/2021   TRIG 93.0 08/06/2021   CHOLHDL 5 08/06/2021   Lab Results  Component Value Date   ALT 15 08/06/2021   AST 16 08/06/2021   ALKPHOS 63 08/06/2021   BILITOT 0.7 08/06/2021   Erectile dysfunction: Treated with Cialis 5 mg few times per week, no new headache, flushing, back pain, vision or hearing changes, no chest pain with exertion.  Vitamin D deficiency Improved at 33.3 in May, up from 29.08 in November 2022.  Was taking over-the-counter supplement at that time. Vit D supplement daily.   Nasal congestion Past few weeks. Passages feel blocked, not much congestion. Worse at bedtime. Tx: none. Cold with cough, sore throat about a month ago, but that improved. No recent allergy meds. No fever, dyspnea, getting slightly better.      02/11/2022    8:03 AM 02/11/2022    8:02 AM 08/06/2021    8:49 AM 02/05/2021    8:05 AM 08/04/2020    8:15 AM  Depression screen PHQ 2/9  Decreased Interest 0 0 0 0 0  Down, Depressed, Hopeless 0 0 0 0 0  PHQ - 2 Score 0 0 0 0 0  Altered sleeping 0 0     Tired, decreased energy 0 0     Change in appetite 0 0     Feeling bad or failure about yourself  0 0     Trouble concentrating 0 0     Moving slowly or fidgety/restless 0 0     Suicidal thoughts 0      PHQ-9 Score 0 0      Using CPAP for OSA, has appt with specialist soon.   Health Maintenance  Topic Date Due   COVID-19 Vaccine (4 - Moderna series) 02/27/2022 (Originally 05/06/2020)   TETANUS/TDAP   12/22/2026   COLONOSCOPY (Pts 45-28yr Insurance coverage will need to be confirmed)  09/10/2029   INFLUENZA VACCINE  Completed   Hepatitis C Screening  Completed   HIV Screening  Completed   Zoster Vaccines- Shingrix  Completed   HPV VACCINES  Aged Out  Colonoscopy 08/2019.  Prostate:  - hx of prostate CA, hx radical prostatectomy, last urology eval in 2021 - over 10 years at that time since dz, released to me for annual PSA.  Lab Results  Component Value Date   PSA 0.00 Repeated and verified X2. (L) 08/04/2020  Derm - 615mo yr visits.    Immunization History  Administered Date(s) Administered   Influenza Inj Mdck Quad Pf 01/26/2022   Influenza,inj,Quad PF,6+ Mos 12/20/2018, 12/17/2020   Influenza-Unspecified 03/29/2010, 12/27/2013, 12/28/2015, 12/31/2016, 12/22/2017, 12/12/2019   Moderna Sars-Covid-2 Vaccination 06/08/2019, 07/06/2019   PFIZER(Purple Top)SARS-COV-2 Vaccination 03/11/2020   Td 10/03/2006   Tdap 12/21/2016   Zoster Recombinat (Shingrix) 02/04/2020, 08/04/2020  Covid booster - declines.   No results found. Optho - in few weeks.   Dental:every 6 months.   Alcohol: 2 per  week  Tobacco: cigar with golf only.   Exercise/obesity: Walking during the day - 30-46mn most days.  Wt Readings from Last 3 Encounters:  02/11/22 278 lb 12.8 oz (126.5 kg)  11/30/21 276 lb 3.2 oz (125.3 kg)  08/06/21 276 lb 9.6 oz (125.5 kg)   Body mass index is 34.39 kg/m. FITT principle discussed with exercise.  Lab Results  Component Value Date   HGBA1C 5.7 02/05/2021     History Patient Active Problem List   Diagnosis Date Noted   OSA on CPAP 03/11/2017   Obesity 07/10/2015   Hyperlipidemia    Unspecified vitamin D deficiency    Herpes simplex type II infection    Past Medical History:  Diagnosis Date   Asperger's syndrome    Never formally diagnosed - potential traits of   Herpes simplex type II infection    Hyperlipidemia    [place]     Multiple lipomas    Premature ejaculation 07/2004   [place] 04[place]   Unspecified vitamin D deficiency    Past Surgical History:  Procedure Laterality Date   CATARACT EXTRACTION, BILATERAL     EYE SURGERY N/A    [facility] 02/01/2020   Hip repalcement  2012   JOINT REPLACEMENT N/A    [employer] 02/01/2020   KNEE ARTHROSCOPY W/ MENISCAL REPAIR Left 12/2014   PROSTATE SURGERY N/A    [employer] 02/01/2020   replacment     Lt hip Feb 2022   SHOULDER OPEN ROTATOR CUFF REPAIR     XI ROBOTIC ASSISTED SIMPLE PROSTATECTOMY  05/21/2009   No Known Allergies Prior to Admission medications   Medication Sig Start Date End Date Taking? Authorizing Provider  Cholecalciferol 25 MCG (1000 UT) tablet Take by mouth.   Yes [provider]  rosuvastatin (CRESTOR) 40 MG tablet Take 1 tablet (40 mg total) by mouth daily. 02/05/21  Yes GWendie Agreste MD  tadalafil (CIALIS) 5 MG tablet Take 1 tablet (5 mg total) by mouth daily as needed. 02/05/21  Yes GWendie Agreste MD  valACYclovir (VALTREX) 500 MG tablet Take 1 tablet (500 mg total) by mouth 2 (two) times daily. For 3 days at time of flair. 08/04/20  Yes GWendie Agreste MD  Ascorbic Acid (VITAMIN C) 1000 MG tablet Take 1,000 mg by mouth daily. Patient not taking: Reported on 02/11/2022    [provider]   Social History   Socioeconomic History   Marital status: Married    Spouse name: VRoxanne Gates  Number of children: 2   Years of education: 18   Highest education level: Master's degree (e.g., MA, MS, MEng, MEd, MSW, MBA)  Occupational History   Not on file  Tobacco Use   Smoking status: Light Smoker    Types: Cigars   Smokeless tobacco: Never   Tobacco comments:    Smokes cigar when playing golf quit smoking cigarettes in 2004   Vaping Use   Vaping Use: Never used  Substance and Sexual Activity   Alcohol use: Yes    Alcohol/week: 1.0 - 2.0 standard drink of alcohol    Types: 1 - 2 Cans of beer per  week   Drug use: No   Sexual activity: Yes  Other Topics Concern   Not on file  Social History Narrative   Not on file   Social Determinants of Health   Financial Resource Strain: Not on file  Food Insecurity: Not on file  Transportation Needs: Not on file  Physical Activity: Not on file  Stress: Not on file  Social Connections: Not on file  Intimate Partner Violence: Not on file    Review of Systems 13 point review of systems per patient health survey noted.  Negative other than as indicated above or in HPI.    Objective:   Vitals:   02/11/22 0806  BP: 128/76  Pulse: 63  Temp: 98.2 F (36.8 C)  SpO2: 97%  Weight: 278 lb 12.8 oz (126.5 kg)  Height: 6' 3.5" (1.918 m)     Physical Exam Vitals reviewed.  Constitutional:      Appearance: He is well-developed.  HENT:     Head: Normocephalic and atraumatic.     Right Ear: External ear normal.     Left Ear: External ear normal.  Eyes:     Conjunctiva/sclera: Conjunctivae normal.     Pupils: Pupils are equal, round, and reactive to light.  Neck:     Thyroid: No thyromegaly.  Cardiovascular:     Rate and Rhythm: Normal rate and regular rhythm.     Heart sounds: Normal heart sounds.  Pulmonary:     Effort: Pulmonary effort is normal. No respiratory distress.     Breath sounds: Normal breath sounds. No wheezing.  Abdominal:     General: There is no distension.     Palpations: Abdomen is soft.     Tenderness: There is no abdominal tenderness.  Musculoskeletal:        General: No tenderness. Normal range of motion.     Cervical back: Normal range of motion and neck supple.  Lymphadenopathy:     Cervical: No cervical adenopathy.  Skin:    General: Skin is warm and dry.  Neurological:     Mental Status: He is alert and oriented to person, place, and time.     Deep Tendon Reflexes: Reflexes are normal and symmetric.  Psychiatric:        Behavior: Behavior normal.     Assessment & Plan:  Scott [name]  is a 61 y.o. male . Annual physical exam - Plan: rosuvastatin (CRESTOR) 40 MG tablet, tadalafil (CIALIS) 5 MG tablet  - -anticipatory guidance as below in AVS, screening labs above. Health maintenance items as above in HPI discussed/recommended as applicable.   Hyperlipidemia, unspecified hyperlipidemia type - Plan: Comprehensive metabolic panel, Lipid panel, rosuvastatin (CRESTOR) 40 MG tablet  -  Stable, tolerating current regimen. Medications refilled. Labs pending as above.   Nasal congestion - Plan: ipratropium (ATROVENT) 0.06 % nasal spray  - ? Allergy vs residual congestion from prior URI. Trial of flonase, or atrovent and return if not improving.   Hyperglycemia - Plan: Hemoglobin A1c  - diet, exercise discussed above, check A1C  Personal history of prostate cancer - Plan: PSA  Erectile dysfunction, unspecified erectile dysfunction type - Plan: tadalafil (CIALIS) 5 MG tablet  -  Stable, tolerating current regimen. Medications refilled.  Meds ordered this encounter  Medications   rosuvastatin (CRESTOR) 40 MG tablet    Sig: Take 1 tablet (40 mg total) by mouth daily.    Dispense:  90 tablet    Refill:  3   ipratropium (ATROVENT) 0.06 % nasal spray    Sig: Place 1-2 sprays into both nostrils 4 (four) times daily. As needed for nasal congestion    Dispense:  15 mL    Refill:  5   tadalafil (CIALIS) 5 MG tablet    Sig: Take 1 tablet (5 mg total) by mouth daily  as needed.    Dispense:  30 tablet    Refill:  11   Patient Instructions  Try otc flonase for the congestion at bedtime. If not effective, I prescribed a different nasal spray. Follow up if not improving.  No other med changes today. I will let you know if there are concerns on labs.   Preventive Care 18-59 Years Old, Male Preventive care refers to lifestyle choices and visits with your health care provider that can promote health and wellness. Preventive care visits are also called wellness exams. What can I expect  for my preventive care visit? Counseling During your preventive care visit, your health care provider may ask about your: Medical history, including: Past medical problems. Family medical history. Current health, including: Emotional well-being. Home life and relationship well-being. Sexual activity. Lifestyle, including: Alcohol, nicotine or tobacco, and drug use. Access to firearms. Diet, exercise, and sleep habits. Safety issues such as seatbelt and bike helmet use. Sunscreen use. Work and work Statistician. Physical exam Your health care provider will check your: Height and weight. These may be used to calculate your BMI (body mass index). BMI is a measurement that tells if you are at a healthy weight. Waist circumference. This measures the distance around your waistline. This measurement also tells if you are at a healthy weight and may help predict your risk of certain diseases, such as type 2 diabetes and high blood pressure. Heart rate and blood pressure. Body temperature. Skin for abnormal spots. What immunizations do I need?  Vaccines are usually given at various ages, according to a schedule. Your health care provider will recommend vaccines for you based on your age, medical history, and lifestyle or other factors, such as travel or where you work. What tests do I need? Screening Your health care provider may recommend screening tests for certain conditions. This may include: Lipid and cholesterol levels. Diabetes screening. This is done by checking your blood sugar (glucose) after you have not eaten for a while (fasting). Hepatitis B test. Hepatitis C test. HIV (human immunodeficiency virus) test. STI (sexually transmitted infection) testing, if you are at risk. Lung cancer screening. Prostate cancer screening. Colorectal cancer screening. Talk with your health care provider about your test results, treatment options, and if necessary, the need for more  tests. Follow these instructions at home: Eating and drinking  Eat a diet that includes fresh fruits and vegetables, whole grains, lean protein, and low-fat dairy products. Take vitamin and mineral supplements as recommended by your health care provider. Do not drink alcohol if your health care provider tells you not to drink. If you drink alcohol: Limit how much you have to 0-2 drinks a day. Know how much alcohol is in your drink. In the [place] one drink equals one 12 oz bottle of beer (355 mL), one 5 oz glass of wine (148 mL), or one 1 oz glass of hard liquor (44 mL). Lifestyle Brush your teeth every morning and night with fluoride toothpaste. Floss one time each day. Exercise for at least 30 minutes 5 or more days each week. Do not use any products that contain nicotine or tobacco. These products include cigarettes, chewing tobacco, and vaping devices, such as e-cigarettes. If you need help quitting, ask your health care provider. Do not use drugs. If you are sexually active, practice safe sex. Use a condom or other form of protection to prevent STIs. Take aspirin only as told by your health care provider. Make sure that you understand how  much to take and what form to take. Work with your health care provider to find out whether it is safe and beneficial for you to take aspirin daily. Find healthy ways to manage stress, such as: Meditation, yoga, or listening to music. Journaling. Talking to a trusted person. Spending time with friends and family. Minimize exposure to UV radiation to reduce your risk of skin cancer. Safety Always wear your seat belt while driving or riding in a vehicle. Do not drive: If you have been drinking alcohol. Do not ride with someone who has been drinking. When you are tired or distracted. While texting. If you have been using any mind-altering substances or drugs. Wear a helmet and other protective equipment during sports activities. If you have firearms  in your house, make sure you follow all gun safety procedures. What's next? Go to your health care provider once a year for an annual wellness visit. Ask your health care provider how often you should have your eyes and teeth checked. Stay up to date on all vaccines. This information is not intended to replace advice given to you by your health care provider. Make sure you discuss any questions you have with your health care provider. Document Revised: 09/10/2020 Document Reviewed: 09/10/2020 Elsevier Patient Education  [place],   Merri Ray, MD [facility] 02/11/22 8:38 AM

## 2022-02-15 ENCOUNTER — Ambulatory Visit: Payer: BC Managed Care – PPO | Admitting: Neurology

## 2022-02-15 ENCOUNTER — Encounter: Payer: Self-pay | Admitting: Neurology

## 2022-02-15 VITALS — BP 133/82 | HR 68 | Ht 75.0 in | Wt 281.0 lb

## 2022-02-15 DIAGNOSIS — G4733 Obstructive sleep apnea (adult) (pediatric): Secondary | ICD-10-CM

## 2022-02-15 NOTE — Patient Instructions (Signed)
Please continue using your CPAP regularly. While your insurance requires that you use CPAP at least 4 hours each night on 70% of the nights, I recommend, that you not skip any nights and use it throughout the night if you can. Getting used to CPAP and staying with the treatment long term does take time and patience and discipline. Untreated obstructive sleep apnea when it is moderate to severe can have an adverse impact on cardiovascular health and raise her risk for heart disease, arrhythmias, hypertension, congestive heart failure, stroke and diabetes. Untreated obstructive sleep apnea causes sleep disruption, nonrestorative sleep, and sleep deprivation. This can have an impact on your day to day functioning and cause daytime sleepiness and impairment of cognitive function, memory loss, mood disturbance, and problems focussing. Using CPAP regularly can improve these symptoms. We can see you in 1 year, you can see Ward Givens again.  If you like, we can offer you a virtual visit through [facility].

## 2022-02-15 NOTE — Progress Notes (Signed)
Subjective:    Patient ID: Scott [name] is a 61 y.o. male.  HPI    Interim history:   Mr. Hatlestad is a 61 year old right-handed gentleman with an underlying medical history of hyperlipidemia, prostate cancer status post prostatectomy, mood disorder, vitamin D deficiency, arthritis with status post knee surgery, rotator cuff surgery, hip replacement, and status post bilateral cataract extractions, history of melanoma and obesity, who presents for follow-up consultation of his obstructive sleep apnea, well-established on CPAP therapy.  The patient is unaccompanied today.  I last saw him on 05/04/2017, at which time he was compliant with his CPAP and doing well.  He saw Ward Givens, NP, in the interim on 11/08/2017, at which time he had good apnea control and was fully compliant with his CPAP of 9 cm.  He was advised to follow-up in 1 year.    He saw Ward Givens, NP on 11/14/2018, at which time he had no problems with his CPAP and continued to be fully compliant.  He saw Ward Givens, NP on 02/04/2020, at which time he reported doing well with his CPAP but at times he was confused upon awakening.  He saw Ward Givens, NP on 02/09/2021, at which time he was doing well and denied any new issues.   Today, 02/15/22: I reviewed his CPAP compliance data from 01/12/2022 through 02/10/2022, which is a total of 30 days, during which time he used his machine 28 days with percent use days greater than 4 hours at 87%, indicating very good compliance with an average usage of 6 hours and 43 minutes, residual AHI at goal at 0.6/h, leak acceptable with some fluctuation, 95th percentile at 18.9 L/min on a pressure of 9 cm with EPR of 3.  He reports doing well with his machine, no MRSA seen, continues to work well also machine is about 62 years old.  He is very motivated to continue with treatment, in mid October he had a cold and could not use his machine for a couple of days but other than that he is  compliant and continues to benefit from it.  He is up-to-date with his supplies generally speaking, uses a nasal mask.  No significant mouth dryness.  Previously:   I first met him on 01/11/2017 at the request of his primary care physician, at which time he reported snoring and sleep disruption, some dream enactments and daytime somnolence. I suggested we proceed with a sleep study. He had a split-night sleep study on 02/11/2017. I went over his test results with him in detail today. Baseline sleep latency was delayed at 38 minutes, REM sleep was absent. Sleep efficiency 63.8%. Loud snoring was noted. He had a significant increase in light stage sleep, total AHI of 78.1 per hour, average oxygen saturation of 95%, nadir of 79% and time below or at 88% saturation was over 1 hour. He had no significant PLMS. He was fitted with a nasal mask and CPAP was titrated from 5 cm to 8 cm. On the final pressure his AHI was 0.3 per hour, supine REM sleep was achieved but O2 nadir was 85%. He had no significant PLMS. He had 17% of slow-wave sleep and significant REM rebound at 42.5%. Based on his test results I prescribed CPAP therapy for home use at a pressure of 9 cm.   I reviewed his CPAP compliance data from 04/03/2017 through 05/02/2017 which is a total of 30 days, during which time he used his CPAP every night with percent used  days greater than 4 hours at 100%, indicating superb compliance with an average usage of 6 hours and 50 minutes, residual AHI at goal at 1.9 per hour, leak acceptable with the 95th percentile at 12.4 L/m on a pressure of 9 cm with EPR of 3. He reports doing well with CPAP. He feels improved in his sleep consolidation and sleep quality. He does not mind using the nasal mask. A couple of times he had nasal congestion and it was difficult to use CPAP at the time which is understandable. He has not had any recent changes in his medical history or medications. He also first from significant arthritis  in his right knee. He is status post left knee replacement surgery in June last year. He had cataract surgeries as well.     01/11/2017: (He) reports snoring and some sleep disruption, has had some dream enactments infrequently as well. Snoring disturbs his wife and sometimes she sleeps in a different bedroom. His father use to snore loudly but was never tested for sleep apnea. I reviewed your office note from 12/21/2016. He has 2 kids from his first marriage, daughter age 46 and son age 55. He is the Pharmacologist at [employer]. He has had dream enactments infrequently.  He has no morning headaches, denies RLS symptoms or leg twitching, has nocturia 2-3 times per night, no obvious FHx of OSA. He had L TKA in June 2018 under Dr. Mayer Camel. Has R knee pain, s/p R THR in 2012. He had a PE after his prostatectomy. He is now on ASA 46m. his bedtime is typically around 10 and wake up time is around 5:45 AM. His Epworth sleepiness score is 4 out of 24 today, fatigue score is 25 out of 63.    His Past Medical History Is Significant For: Past Medical History:  Diagnosis Date   Asperger's syndrome    Never formally diagnosed - potential traits of   Herpes simplex type II infection    Hyperlipidemia    [place]    Multiple lipomas    Premature ejaculation 07/2004   Pulmonary embolism ([facility] 04/2009   Unspecified vitamin D deficiency     His Past Surgical History Is Significant For: Past Surgical History:  Procedure Laterality Date   CATARACT EXTRACTION, BILATERAL     EYE SURGERY N/A    [facility] 02/01/2020   Hip repalcement  2012   JOINT REPLACEMENT N/A    [facility] 02/01/2020   KNEE ARTHROSCOPY W/ MENISCAL REPAIR Left 12/2014   PROSTATE SURGERY N/A    [facility] 02/01/2020   replacment     Lt hip Feb 2022   SHOULDER OPEN ROTATOR CUFF REPAIR     XI ROBOTIC ASSISTED SIMPLE PROSTATECTOMY  05/21/2009    His Family History Is Significant For: Family History   Problem Relation Age of Onset   Diabetes Mother    Hypertension Mother    Rheum arthritis Mother    Cancer Father    Heart failure Father        "heart trouble"   Heart disease Other    Hyperlipidemia Other    Diabetes Other    Sleep apnea Neg Hx     His Social History Is Significant For: Social History   Socioeconomic History   Marital status: Married    Spouse name: VRoxanne Gates  Number of children: 2   Years of education: 18   Highest education level: Master's degree (e.g., MA, MS, MEng, MEd, MSW, MBA)  Occupational History   Not on file  Tobacco Use   Smoking status: Light Smoker    Types: Cigars   Smokeless tobacco: Never   Tobacco comments:    Smokes cigar when playing golf quit smoking cigarettes in 2004   Vaping Use   Vaping Use: Never used  Substance and Sexual Activity   Alcohol use: Yes    Alcohol/week: 4.0 standard drinks of alcohol    Types: 2 Cans of beer, 2 Shots of liquor per week   Drug use: No   Sexual activity: Yes  Other Topics Concern   Not on file  Social History Narrative   Not on file   Social Determinants of Health   Financial Resource Strain: Not on file  Food Insecurity: Not on file  Transportation Needs: Not on file  Physical Activity: Not on file  Stress: Not on file  Social Connections: Not on file    His Allergies Are:  No Known Allergies:   His Current Medications Are:  Outpatient Encounter Medications as of 02/15/2022  Medication Sig   Cholecalciferol 250 MCG (10000 UT) CAPS Take by mouth daily.   ipratropium (ATROVENT) 0.06 % nasal spray Place 1-2 sprays into both nostrils 4 (four) times daily. As needed for nasal congestion   rosuvastatin (CRESTOR) 40 MG tablet Take 1 tablet (40 mg total) by mouth daily.   tadalafil (CIALIS) 5 MG tablet Take 1 tablet (5 mg total) by mouth daily as needed.   valACYclovir (VALTREX) 500 MG tablet Take 1 tablet (500 mg total) by mouth 2 (two) times daily. For 3 days at time of flair. (Patient  taking differently: Take 500 mg by mouth as needed. For 3 days at time of flair.)   Ascorbic Acid (VITAMIN C) 1000 MG tablet Take 1,000 mg by mouth daily.   No facility-administered encounter medications on file as of 02/15/2022.  :  Review of Systems:  Out of a complete 14 point review of systems, all are reviewed and negative with the exception of these symptoms as listed below:  Review of Systems  Neurological:        Pt here for CPAP f/u  Pt states no questions or concerns for this visit     ESS:1     Objective:  Neurological Exam  Physical Exam Physical Examination:   Vitals:   02/15/22 0730  BP: 133/82  Pulse: 68    General Examination: The patient is a very pleasant 61 y.o. male in no acute distress. He appears well-developed and well-nourished and well groomed.   HEENT: Normocephalic, atraumatic, pupils are equal, round and reactive to light and accommodation. Extraocular tracking is well-preserved, no obvious nystagmus, hearing grossly intact. Face is symmetric with normal facial animation. Speech is clear with no dysarthria noted. There is no hypophonia. There is no lip, neck/head, jaw or voice tremor. Neck is supple with full range of passive and active motion. There are no carotid bruits on auscultation. Oropharynx exam reveals: no change.  No significant mouth dryness, tongue protrudes centrally and palate elevates symmetrically.   Chest: Clear to auscultation without wheezing, rhonchi or crackles noted.   Heart: S1+S2+0, regular and normal without murmurs, rubs or gallops noted.    Abdomen: Soft, non-tender and non-distended.   Extremities: There is no pitting edema in the distal lower extremities bilaterally.    Skin: Warm and dry without trophic changes noted.   Musculoskeletal: exam reveals no obvious joint deformities.   Neurologically:  Mental status: The patient is  awake, alert and oriented in all 4 spheres. His immediate and remote memory,  attention, language skills and fund of knowledge are appropriate. There is no evidence of aphasia, agnosia, apraxia or anomia. Speech is clear with normal prosody and enunciation. Thought process is linear. Mood is normal and affect is normal.  Cranial nerves II - XII are as described above under HEENT exam.  Motor exam: Normal bulk, strength and tone is noted. There is no obvious tremor.  Fine motor skills and coordination: grossly intact.  Cerebellar testing: No dysmetria or intention tremor. There is no truncal or gait ataxia.  Sensory exam: intact to light touch in the upper and lower extremities.  Gait, station and balance: He stands easily. No veering to one side is noted. No leaning to one side is noted. Posture is age-appropriate and stance is narrow based. Gait shows normal stride length and normal pace. No problems turning are noted.    Assessment and Plan:  In summary, BRECCAN GALANT is a very pleasant 61 year old right-handed gentleman with an underlying medical history of hyperlipidemia, prostate cancer status post prostatectomy, mood disorder, vitamin D deficiency, arthritis with status post knee surgery, rotator cuff surgery, hip replacement, and status post bilateral cataract extractions, history of melanoma and obesity, who presents for follow-up consultation of his obstructive sleep apnea, well-established on CPAP therapy.  He had a split-night sleep study on 02/11/2017.  He continued to be compliant with treatment, treatment adherence.  He is up-to-date with his supplies well, is currently not in any rush to replace his machine.  We will consider a home sleep test next year to replace his machine, he is advised to follow-up routinely in 1 year to see Ward Givens, NP.  I answered all his questions today and he was in agreement.  I spent 20 minutes in total face-to-face time and in reviewing records during pre-charting, more than 50% of which was spent in counseling and coordination  of care, reviewing test results, reviewing medications and treatment regimen and/or in discussing or reviewing the diagnosis of OSA, the prognosis and treatment options. Pertinent laboratory and imaging test results that were available during this visit with the patient were reviewed by me and considered in my medical decision making (see chart for details).

## 2022-02-16 ENCOUNTER — Other Ambulatory Visit: Payer: Self-pay | Admitting: Family Medicine

## 2022-02-16 DIAGNOSIS — E875 Hyperkalemia: Secondary | ICD-10-CM

## 2022-02-16 NOTE — Progress Notes (Signed)
See lab note.  

## 2022-03-03 ENCOUNTER — Encounter: Payer: Self-pay | Admitting: Adult Health

## 2022-04-22 DIAGNOSIS — G4733 Obstructive sleep apnea (adult) (pediatric): Secondary | ICD-10-CM | POA: Diagnosis not present

## 2022-05-23 DIAGNOSIS — G4733 Obstructive sleep apnea (adult) (pediatric): Secondary | ICD-10-CM | POA: Diagnosis not present

## 2022-05-27 DIAGNOSIS — R208 Other disturbances of skin sensation: Secondary | ICD-10-CM | POA: Diagnosis not present

## 2022-05-27 DIAGNOSIS — L918 Other hypertrophic disorders of the skin: Secondary | ICD-10-CM | POA: Diagnosis not present

## 2022-05-27 DIAGNOSIS — D492 Neoplasm of unspecified behavior of bone, soft tissue, and skin: Secondary | ICD-10-CM | POA: Diagnosis not present

## 2022-06-01 ENCOUNTER — Encounter: Payer: Self-pay | Admitting: Family Medicine

## 2022-06-01 DIAGNOSIS — Z8619 Personal history of other infectious and parasitic diseases: Secondary | ICD-10-CM

## 2022-06-02 MED ORDER — VALACYCLOVIR HCL 500 MG PO TABS: 500.0000 mg | ORAL_TABLET | Freq: Two times a day (BID) | ORAL | 3 refills | Status: DC

## 2022-06-21 DIAGNOSIS — G4733 Obstructive sleep apnea (adult) (pediatric): Secondary | ICD-10-CM | POA: Diagnosis not present

## 2022-08-12 ENCOUNTER — Other Ambulatory Visit: Payer: Self-pay

## 2022-08-13 ENCOUNTER — Ambulatory Visit: Payer: BC Managed Care – PPO | Admitting: Family Medicine

## 2022-08-13 ENCOUNTER — Other Ambulatory Visit: Payer: Self-pay

## 2022-08-13 ENCOUNTER — Encounter: Payer: Self-pay | Admitting: Family Medicine

## 2022-08-13 VITALS — BP 130/80 | HR 66 | Temp 98.3°F | Wt 266.0 lb

## 2022-08-13 DIAGNOSIS — R0981 Nasal congestion: Secondary | ICD-10-CM

## 2022-08-13 DIAGNOSIS — Z0000 Encounter for general adult medical examination without abnormal findings: Secondary | ICD-10-CM

## 2022-08-13 DIAGNOSIS — N529 Male erectile dysfunction, unspecified: Secondary | ICD-10-CM

## 2022-08-13 DIAGNOSIS — Z8619 Personal history of other infectious and parasitic diseases: Secondary | ICD-10-CM | POA: Diagnosis not present

## 2022-08-13 DIAGNOSIS — E785 Hyperlipidemia, unspecified: Secondary | ICD-10-CM

## 2022-08-13 DIAGNOSIS — E559 Vitamin D deficiency, unspecified: Secondary | ICD-10-CM | POA: Diagnosis not present

## 2022-08-13 LAB — COMPREHENSIVE METABOLIC PANEL
ALT: 15 U/L (ref 0–53)
AST: 20 U/L (ref 0–37)
Albumin: 4.4 g/dL (ref 3.5–5.2)
Alkaline Phosphatase: 70 U/L (ref 39–117)
BUN: 15 mg/dL (ref 6–23)
CO2: 29 mEq/L (ref 19–32)
Calcium: 9.7 mg/dL (ref 8.4–10.5)
Chloride: 102 mEq/L (ref 96–112)
Creatinine, Ser: 0.88 mg/dL (ref 0.40–1.50)
GFR: 91.87 mL/min (ref >60.00)
Glucose, Bld: 92 mg/dL (ref 70–99)
Potassium: 4.7 mEq/L (ref 3.5–5.1)
Sodium: 138 mEq/L (ref 135–145)
Total Bilirubin: 0.7 mg/dL (ref 0.2–1.2)
Total Protein: 7 g/dL (ref 6.0–8.3)

## 2022-08-13 LAB — LIPID PANEL
Cholesterol: 187 mg/dL (ref 0–200)
HDL: 44.1 mg/dL (ref >39.00)
LDL Cholesterol: 125 mg/dL — ABNORMAL HIGH (ref 0–99)
NonHDL: 142.9
Total CHOL/HDL Ratio: 4
Triglycerides: 88 mg/dL (ref 0.0–149.0)
VLDL: 17.6 mg/dL (ref 0.0–40.0)

## 2022-08-13 LAB — VITAMIN D 25 HYDROXY (VIT D DEFICIENCY, FRACTURES): VITD: 68.63 ng/mL (ref 30.00–100.00)

## 2022-08-13 MED ORDER — TADALAFIL 5 MG PO TABS: 5.0000 mg | ORAL_TABLET | Freq: Every day | ORAL | 11 refills | Status: DC | PRN

## 2022-08-13 MED ORDER — ROSUVASTATIN CALCIUM 40 MG PO TABS: 40.0000 mg | ORAL_TABLET | Freq: Every day | ORAL | 3 refills | Status: DC

## 2022-08-13 MED ORDER — VALACYCLOVIR HCL 500 MG PO TABS: 500.0000 mg | ORAL_TABLET | Freq: Two times a day (BID) | ORAL | 3 refills | Status: DC

## 2022-08-13 MED ORDER — VALACYCLOVIR HCL 500 MG PO TABS
500.0000 mg | ORAL_TABLET | Freq: Two times a day (BID) | ORAL | 3 refills | Status: DC
Start: 2022-08-13 — End: 2023-02-21

## 2022-08-13 NOTE — Progress Notes (Signed)
Subjective:  Patient ID: Scott [name], male    DOB: Feb 24, 1961  Age: 62 y.o. MRN: [id number]  CC:  Chief Complaint  Patient presents with   Follow-up    6 month follow. Needs to discuss issues with side effects from using the Cpap.    HPI Scott [name] presents for   Follow up.  Viking cruise in July in [place]  Hyperlipidemia: Crestor 40 mg daily.  LDL had improved from 145 in May of last year to 131 in November.  No new side effects. Working on weight loss - more walking and 16# weight loss  Lab Results  Component Value Date   CHOL 195 02/11/2022   HDL 45.40 02/11/2022   LDLCALC 131 (H) 02/11/2022   TRIG 90.0 02/11/2022   CHOLHDL 4 02/11/2022   Lab Results  Component Value Date   ALT 24 02/11/2022   AST 21 02/11/2022   ALKPHOS 65 02/11/2022   BILITOT 0.6 02/11/2022   Erectile dysfunction Treated with Cialis 5 mg QD,  no new hearing or vision changes, or chest pain/dyspnea with exertion.  5 mg dose has been effective. Some low back pain at times, no injury. Notices with prolonged sitting - doing home stretches helps.  No bowel or bladder incontinence, no saddle anesthesia, no lower extremity weakness.   Vitamin D deficiency Treated with over-the-counter supplement - 10k unis.  Last vitamin D Lab Results  Component Value Date   VD25OH 33.31 08/06/2021   History of genital HSV Treated with Valtrex during flares, works well. Last flare - month ago.  Typically 3 to 4/year.  Obstructive sleep apnea on CPAP Visit noted in November with sleep specialist.  Good compliance at that time at 87% 4-hour use or more, residual AHI at goal of 0.6/h.  Leak acceptable with some fluctuation.  Machine was 62 years old at that time.  No specific concerns with use of equipment at that time.  Plan for home sleep test this year to replace his machine, 1 year follow-up planned. Last 9 months notices closed feeling of nostrils when wakes up to use bathroom at night, then trouble  breathing with return to cpap. Tries to blow nose, then bleeding at times. Trial of atrovent nasal spray prior - minimal change. Not having daytime congestion. Blood with blowing nose about once per week.  No recent daytime sneezing/runny nose/congestion.     History Patient Active Problem List   Diagnosis Date Noted   OSA on CPAP 03/11/2017   Obesity 07/10/2015   Hyperlipidemia    Unspecified vitamin D deficiency    Herpes simplex type II infection    Past Medical History:  Diagnosis Date   Asperger's syndrome    Never formally diagnosed - potential traits of   Herpes simplex type II infection    Hyperlipidemia    Malignant neoplasm of prostate ([facility])    Multiple lipomas    Premature ejaculation 07/2004   Pulmonary embolism ([facility]) 04/2009   Unspecified vitamin D deficiency    Past Surgical History:  Procedure Laterality Date   CATARACT EXTRACTION, BILATERAL     EYE SURGERY N/A    [facility] 02/01/2020   Hip repalcement  2012   JOINT REPLACEMENT N/A    [facility] 02/01/2020   KNEE ARTHROSCOPY W/ MENISCAL REPAIR Left 12/2014   PROSTATE SURGERY N/A    [facility] 02/01/2020   replacment     Lt hip Feb 2022   SHOULDER OPEN ROTATOR CUFF REPAIR     XI  ROBOTIC ASSISTED SIMPLE PROSTATECTOMY  05/21/2009   No Known Allergies Prior to Admission medications   Medication Sig Start Date End Date Taking? Authorizing Provider  Ascorbic Acid (VITAMIN C) 1000 MG tablet Take 1,000 mg by mouth daily.   Yes [provider]  Cholecalciferol 250 MCG (10000 UT) CAPS Take by mouth daily.   Yes [provider]  rosuvastatin (CRESTOR) 40 MG tablet Take 1 tablet (40 mg total) by mouth daily. 02/11/22  Yes Shade Flood, MD  tadalafil (CIALIS) 5 MG tablet Take 1 tablet (5 mg total) by mouth daily as needed. 02/11/22  Yes Shade Flood, MD  valACYclovir (VALTREX) 500 MG tablet Take 1 tablet (500 mg total) by mouth 2 (two) times daily. For 3 days at time of flair. 06/02/22  Yes  Shade Flood, MD  ipratropium (ATROVENT) 0.06 % nasal spray Place 1-2 sprays into both nostrils 4 (four) times daily. As needed for nasal congestion Patient not taking: Reported on 08/13/2022 02/11/22   Shade Flood, MD   Social History   Socioeconomic History   Marital status: Married    Spouse name: Orlean Bradford   Number of children: 2   Years of education: 18   Highest education level: Master's degree (e.g., MA, MS, MEng, MEd, MSW, MBA)  Occupational History   Not on file  Tobacco Use   Smoking status: Light Smoker    Types: Cigars   Smokeless tobacco: Never   Tobacco comments:    Smokes cigar when playing golf quit smoking cigarettes in 2004   Vaping Use   Vaping Use: Never used  Substance and Sexual Activity   Alcohol use: Yes    Alcohol/week: 4.0 standard drinks of alcohol    Types: 2 Cans of beer, 2 Shots of liquor per week   Drug use: No   Sexual activity: Yes  Other Topics Concern   Not on file  Social History Narrative   Not on file   Social Determinants of Health   Financial Resource Strain: Not on file  Food Insecurity: Not on file  Transportation Needs: Not on file  Physical Activity: Not on file  Stress: Not on file  Social Connections: Not on file  Intimate Partner Violence: Not on file    Review of Systems Per HPI.   Objective:   Vitals:   08/13/22 0803  BP: 130/80  Pulse: 66  Temp: 98.3 F (36.8 C)  SpO2: 100%  Weight: 266 lb (120.7 kg)     Physical Exam Vitals reviewed.  Constitutional:      Appearance: He is well-developed.  HENT:     Head: Normocephalic and atraumatic.     Nose:     Comments: Slight dry mucosa of nasal passages but no active bleeding or wounds identified. Neck:     Vascular: No carotid bruit or JVD.  Cardiovascular:     Rate and Rhythm: Normal rate and regular rhythm.     Heart sounds: Normal heart sounds. No murmur heard. Pulmonary:     Effort: Pulmonary effort is normal.     Breath sounds: Normal  breath sounds. No rales.  Musculoskeletal:     Right lower leg: No edema.     Left lower leg: No edema.     Comments: Lumbar spine nontender.  Skin:    General: Skin is warm and dry.  Neurological:     Mental Status: He is alert and oriented to person, place, and time.  Psychiatric:  Mood and Affect: Mood normal.        Assessment & Plan:  Scott [name] is a 62 y.o. male . Nasal congestion  -Noted only with use of CPAP.  Can try saline nasal spray just prior to use of CPAP at night, does have humidifier but may need to discuss equipment or change in equipment with his sleep specialist, recommended contacting them if symptoms do not improve with use of saline nasal spray.  If symptoms persist, especially with any persistent epistaxis or blood within nasal congestion would like him to meet with ENT.  RTC precautions.  Hyperlipidemia, unspecified hyperlipidemia type - Plan: Comprehensive metabolic panel, Lipid panel, rosuvastatin (CRESTOR) 40 MG tablet  -Tolerating current dose Crestor, check updated labs, adjustment of plan accordingly.  Continue same dose for now.  Erectile dysfunction, unspecified erectile dysfunction type - Plan: tadalafil (CIALIS) 5 MG tablet  -Stable, tolerating daily Cialis, slight low back pain, option of trial off meds but less likely related to Cialis.  Home stretches, symptomatic care with RTC precautions.  Continue same dose Cialis for now.  History of herpes genitalis - Plan: valACYclovir (VALTREX) 500 MG tablet  -Infrequent flares, Valtrex refilled as needed.  Vitamin D deficiency - Plan: Vitamin D (25 hydroxy) Check updated labs, continue over-the-counter supplement.  Meds ordered this encounter  Medications   rosuvastatin (CRESTOR) 40 MG tablet    Sig: Take 1 tablet (40 mg total) by mouth daily.    Dispense:  90 tablet    Refill:  3   tadalafil (CIALIS) 5 MG tablet    Sig: Take 1 tablet (5 mg total) by mouth daily as needed.     Dispense:  30 tablet    Refill:  11   valACYclovir (VALTREX) 500 MG tablet    Sig: Take 1 tablet (500 mg total) by mouth 2 (two) times daily. For 3 days at time of flair.    Dispense:  12 tablet    Refill:  3   Patient Instructions  Try saline nasal spray at bedtime, and contact your sleep specialist about the nasal symptoms during the night to see if other device or change in your equipment is needed. If persistent nasal issues, I am happy to refer you to ENT.   No med changes today. Continue stretches for back but follow up if that continues.  Depending on labs can decide if other meds needed. Keep up the good work with exercise and weight loss.   Return to the clinic or go to the nearest emergency room if any of your symptoms worsen or new symptoms occur.     Signed,   Meredith Staggers, MD [facility], [facility] 08/13/22 8:43 AM

## 2022-08-13 NOTE — Patient Instructions (Addendum)
Try saline nasal spray at bedtime, and contact your sleep specialist about the nasal symptoms during the night to see if other device or change in your equipment is needed. If persistent nasal issues, I am happy to refer you to ENT.   No med changes today. Continue stretches for back but follow up if that continues.  Depending on labs can decide if other meds needed. Keep up the good work with exercise and weight loss.   Return to the clinic or go to the nearest emergency room if any of your symptoms worsen or new symptoms occur.

## 2022-08-17 DIAGNOSIS — L814 Other melanin hyperpigmentation: Secondary | ICD-10-CM | POA: Diagnosis not present

## 2022-08-17 DIAGNOSIS — L821 Other seborrheic keratosis: Secondary | ICD-10-CM | POA: Diagnosis not present

## 2022-08-17 DIAGNOSIS — D225 Melanocytic nevi of trunk: Secondary | ICD-10-CM | POA: Diagnosis not present

## 2022-11-22 ENCOUNTER — Other Ambulatory Visit: Payer: Self-pay

## 2022-11-22 ENCOUNTER — Encounter: Payer: Self-pay | Admitting: Physician Assistant

## 2022-11-22 ENCOUNTER — Ambulatory Visit (INDEPENDENT_AMBULATORY_CARE_PROVIDER_SITE_OTHER): Payer: Self-pay | Admitting: Physician Assistant

## 2022-11-22 VITALS — BP 130/80 | HR 69 | Temp 97.0°F

## 2022-11-22 DIAGNOSIS — R040 Epistaxis: Secondary | ICD-10-CM
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
## 2023-02-17 ENCOUNTER — Ambulatory Visit: Payer: BC Managed Care – PPO | Admitting: Adult Health

## 2023-02-17 ENCOUNTER — Encounter: Payer: Self-pay | Admitting: Adult Health

## 2023-02-17 VITALS — BP 135/73 | HR 65 | Ht 75.0 in | Wt 266.0 lb

## 2023-02-17 DIAGNOSIS — G4733 Obstructive sleep apnea (adult) (pediatric): Secondary | ICD-10-CM | POA: Diagnosis not present

## 2023-02-21 ENCOUNTER — Ambulatory Visit: Payer: BC Managed Care – PPO | Admitting: Family Medicine

## 2023-02-21 ENCOUNTER — Encounter: Payer: Self-pay | Admitting: Family Medicine

## 2023-02-21 VITALS — BP 122/68 | HR 67 | Temp 98.0°F | Ht 74.25 in | Wt 266.2 lb

## 2023-02-21 DIAGNOSIS — E785 Hyperlipidemia, unspecified: Secondary | ICD-10-CM

## 2023-02-21 DIAGNOSIS — Z125 Encounter for screening for malignant neoplasm of prostate: Secondary | ICD-10-CM

## 2023-02-21 DIAGNOSIS — R739 Hyperglycemia, unspecified: Secondary | ICD-10-CM

## 2023-02-21 DIAGNOSIS — Z8619 Personal history of other infectious and parasitic diseases: Secondary | ICD-10-CM | POA: Diagnosis not present

## 2023-02-21 DIAGNOSIS — Z1329 Encounter for screening for other suspected endocrine disorder: Secondary | ICD-10-CM | POA: Diagnosis not present

## 2023-02-21 DIAGNOSIS — Z0000 Encounter for general adult medical examination without abnormal findings: Secondary | ICD-10-CM

## 2023-02-21 LAB — COMPREHENSIVE METABOLIC PANEL
ALT: 14 U/L (ref 0–53)
AST: 15 U/L (ref 0–37)
Albumin: 4.2 g/dL (ref 3.5–5.2)
Alkaline Phosphatase: 65 U/L (ref 39–117)
BUN: 14 mg/dL (ref 6–23)
CO2: 26 meq/L (ref 19–32)
Calcium: 9.4 mg/dL (ref 8.4–10.5)
Chloride: 105 meq/L (ref 96–112)
Creatinine, Ser: 0.86 mg/dL (ref 0.40–1.50)
GFR: 92.16 mL/min (ref >60.00)
Glucose, Bld: 100 mg/dL — ABNORMAL HIGH (ref 70–99)
Potassium: 4.6 meq/L (ref 3.5–5.1)
Sodium: 139 meq/L (ref 135–145)
Total Bilirubin: 0.8 mg/dL (ref 0.2–1.2)
Total Protein: 6.5 g/dL (ref 6.0–8.3)

## 2023-02-21 LAB — CBC WITH DIFFERENTIAL/PLATELET
Basophils Absolute: 0 10*3/uL (ref 0.0–0.1)
Basophils Relative: 0.4 % (ref 0.0–3.0)
Eosinophils Absolute: 0 10*3/uL (ref 0.0–0.7)
Eosinophils Relative: 0.7 % (ref 0.0–5.0)
HCT: 43 % (ref 39.0–52.0)
Hemoglobin: 14.3 g/dL (ref 13.0–17.0)
Lymphocytes Relative: 27.5 % (ref 12.0–46.0)
Lymphs Abs: 1.3 10*3/uL (ref 0.7–4.0)
MCHC: 33.2 g/dL (ref 30.0–36.0)
MCV: 94.8 fL (ref 78.0–100.0)
Monocytes Absolute: 0.4 10*3/uL (ref 0.1–1.0)
Monocytes Relative: 8.2 % (ref 3.0–12.0)
Neutro Abs: 3.1 10*3/uL (ref 1.4–7.7)
Neutrophils Relative %: 63.2 % (ref 43.0–77.0)
Platelets: 162 10*3/uL (ref 150.0–400.0)
RBC: 4.54 Mil/uL (ref 4.22–5.81)
RDW: 13.7 % (ref 11.5–15.5)
WBC: 4.9 10*3/uL (ref 4.0–10.5)

## 2023-02-21 LAB — LIPID PANEL
Cholesterol: 194 mg/dL (ref 0–200)
HDL: 46.3 mg/dL (ref >39.00)
LDL Cholesterol: 130 mg/dL — ABNORMAL HIGH (ref 0–99)
NonHDL: 147.99
Total CHOL/HDL Ratio: 4
Triglycerides: 91 mg/dL (ref 0.0–149.0)
VLDL: 18.2 mg/dL (ref 0.0–40.0)

## 2023-02-21 LAB — PSA: PSA: 0.01 ng/mL — ABNORMAL LOW (ref 0.10–4.00)

## 2023-02-21 LAB — HEMOGLOBIN A1C: Hgb A1c MFr Bld: 5.7 % (ref 4.6–6.5)

## 2023-02-21 LAB — TSH: TSH: 1.8 u[IU]/mL (ref 0.35–5.50)

## 2023-02-21 MED ORDER — VALACYCLOVIR HCL 500 MG PO TABS: 500.0000 mg | ORAL_TABLET | Freq: Two times a day (BID) | ORAL | 3 refills | Status: DC

## 2023-02-21 NOTE — Progress Notes (Signed)
Subjective:  Patient ID: Scott [name], male    DOB: 01-18-1961  Age: 62 y.o. MRN: [id number]  CC:  Chief Complaint  Patient presents with   Annual Exam    Pt is doing well not fasting this am     HPI Scott [name] presents for Annual Exam PCP: me G[facility], Dr. Elnoria Howard Urology, Dr. Laverle Patter, history of prostate cancer, radical prostatectomy with eval in 2021, over 10 years at that time since the disease and plan for annual PSA at my office. Ortho, Dr. Turner Daniels Neuro/sleep, Dr. Frances Furbish, Butch Penny, NP.  OSA on CPAP, appointment November 21.  Inspire discussed but plan for initial travel machine to see if that is tolerated better.  If not, then plan for referral for Inspire procedure evaluation.  Erectile dysfunction Cialis 5 mg daily.  No new headache, flushing, hearing or vision changes and no chest pain or dyspnea with exertion.  Vitamin D deficiency Treated with over-the-counter supplement 10,000 units per day.  Last vitamin D Lab Results  Component Value Date   VD25OH 68.63 08/13/2022   History of genital HSV Valtrex as needed for flares, 3 to 4/year. Still working well.   Hyperlipidemia: Treated with Crestor 40 mg daily. No new myalgias. Some soreness in upper arm on R at times.  Not fasting today.  Lab Results  Component Value Date   CHOL 187 08/13/2022   HDL 44.10 08/13/2022   LDLCALC 125 (H) 08/13/2022   TRIG 88.0 08/13/2022   CHOLHDL 4 08/13/2022   Lab Results  Component Value Date   ALT 15 08/13/2022   AST 20 08/13/2022   ALKPHOS 70 08/13/2022   BILITOT 0.7 08/13/2022   Lab Results  Component Value Date   HGBA1C 5.8 02/11/2022    Wt Readings from Last 3 Encounters:  02/21/23 266 lb 3.2 oz (120.7 kg)  02/17/23 266 lb (120.7 kg)  08/13/22 266 lb (120.7 kg)      02/21/2023    8:10 AM 08/13/2022    8:11 AM 02/11/2022    8:03 AM 02/11/2022    8:02 AM 08/06/2021    8:49 AM  Depression screen PHQ 2/9  Decreased Interest 0 0 0 0 0   Down, Depressed, Hopeless 0 0 0 0 0  PHQ - 2 Score 0 0 0 0 0  Altered sleeping 0 0 0 0   Tired, decreased energy 0 0 0 0   Change in appetite 0 0 0 0   Feeling bad or failure about yourself  0 0 0 0   Trouble concentrating 0 0 0 0   Moving slowly or fidgety/restless 0 0 0 0   Suicidal thoughts 0 0 0    PHQ-9 Score 0 0 0 0   Difficult doing work/chores  Not difficult at all       Health Maintenance  Topic Date Due   COVID-19 Vaccine (4 - 2023-24 season) 11/28/2022   DTaP/Tdap/Td (3 - Td or Tdap) 12/22/2026   Colonoscopy  09/10/2029   INFLUENZA VACCINE  Completed   Hepatitis C Screening  Completed   HIV Screening  Completed   Zoster Vaccines- Shingrix  Completed   HPV VACCINES  Aged Out  Colonoscopy June 2021 - repeat timing unknown -he will call.  Prostate: History of prostate cancer as above with serial monitoring, status post prostatectomy in 2011.  Lab Results  Component Value Date   PSA 0.00 (L) 02/11/2022   PSA 0.00 Repeated and verified X2. (L) 08/04/2020  Immunization History  Administered Date(s) Administered   Influenza Inj Mdck Quad Pf 01/26/2022   Influenza,inj,Quad PF,6+ Mos 12/20/2018, 12/17/2020   Influenza-Unspecified 03/29/2010, 12/27/2013, 12/28/2015, 12/31/2016, 12/22/2017, 12/12/2019, 12/07/2022   Moderna Sars-Covid-2 Vaccination 06/08/2019, 07/06/2019   PFIZER(Purple Top)SARS-COV-2 Vaccination 03/11/2020   Td 10/03/2006   Tdap 12/21/2016   Zoster Recombinant(Shingrix) 02/04/2020, 08/04/2020  Declined covid booster.  No results found. Optho - appt soon.   Dental:Within Last 6 months  Alcohol: 3-4 per week.   Tobacco: rare cigar.   Exercise: elliptical 25 min per day. Prior walking 4 mi per day.    History Patient Active Problem List   Diagnosis Date Noted   OSA on CPAP 03/11/2017   Obesity 07/10/2015   Hyperlipidemia    Vitamin D deficiency    Herpes simplex type II infection    Past Medical History:  Diagnosis Date    Asperger's syndrome    Never formally diagnosed - potential traits of   Herpes simplex type II infection    Hyperlipidemia    Malignant neoplasm of prostate (HCC)    Multiple lipomas    Premature ejaculation 07/2004   Pulmonary embolism (HCC) 04/2009   Unspecified vitamin D deficiency    Past Surgical History:  Procedure Laterality Date   CATARACT EXTRACTION, BILATERAL     EYE SURGERY N/A    [facility] 02/01/2020   Hip repalcement  2012   JOINT REPLACEMENT N/A    [facility] 02/01/2020   KNEE ARTHROSCOPY W/ MENISCAL REPAIR Left 12/2014   PROSTATE SURGERY N/A    [facility] 02/01/2020   replacment     Lt hip Feb 2022   SHOULDER OPEN ROTATOR CUFF REPAIR     XI ROBOTIC ASSISTED SIMPLE PROSTATECTOMY  05/21/2009   No Known Allergies Prior to Admission medications   Medication Sig Start Date End Date Taking? Authorizing Provider  Ascorbic Acid (VITAMIN C) 1000 MG tablet Take 1,000 mg by mouth. Takes occassionally   Yes [provider]  rosuvastatin (CRESTOR) 40 MG tablet Take 1 tablet (40 mg total) by mouth daily. 08/13/22  Yes Shade Flood, MD  tadalafil (CIALIS) 5 MG tablet Take 1 tablet (5 mg total) by mouth daily as needed. 08/13/22  Yes Shade Flood, MD  valACYclovir (VALTREX) 500 MG tablet Take 1 tablet (500 mg total) by mouth 2 (two) times daily. For 3 days at time of flair. 08/13/22  Yes Shade Flood, MD  VITAMIN D, CHOLECALCIFEROL, PO Take by mouth.   Yes [provider]   Social History   Socioeconomic History   Marital status: Married    Spouse name: Vaunda   Number of children: 2   Years of education: 18   Highest education level: Master's degree (e.g., MA, MS, MEng, MEd, MSW, MBA)  Occupational History   Not on file  Tobacco Use   Smoking status: Light Smoker    Types: Cigars   Smokeless tobacco: Never   Tobacco comments:    Smokes cigar when playing golf quit smoking cigarettes in 2004   Vaping Use   Vaping status: Never Used   Substance and Sexual Activity   Alcohol use: Yes    Alcohol/week: 4.0 standard drinks of alcohol    Types: 2 Cans of beer, 2 Shots of liquor per week   Drug use: No   Sexual activity: Yes  Other Topics Concern   Not on file  Social History Narrative   Not on file   Social Determinants of Health   Financial  Resource Strain: Not on file  Food Insecurity: Not on file  Transportation Needs: Not on file  Physical Activity: Not on file  Stress: Not on file  Social Connections: Not on file  Intimate Partner Violence: Not on file    Review of Systems 13 point review of systems per patient health survey noted.  Negative other than as indicated above or in HPI.    Objective:   Vitals:   02/21/23 0808  BP: 122/68  Pulse: 67  Temp: 98 F (36.7 C)  TempSrc: Temporal  SpO2: 96%  Weight: 266 lb 3.2 oz (120.7 kg)  Height: 6' 2.25" (1.886 m)     Physical Exam Vitals reviewed.  Constitutional:      Appearance: He is well-developed.  HENT:     Head: Normocephalic and atraumatic.     Right Ear: External ear normal.     Left Ear: External ear normal.     Nose:     Comments: Sinuses nontender. Eyes:     Conjunctiva/sclera: Conjunctivae normal.     Pupils: Pupils are equal, round, and reactive to light.  Neck:     Thyroid: No thyromegaly.  Cardiovascular:     Rate and Rhythm: Normal rate and regular rhythm.     Heart sounds: Normal heart sounds.  Pulmonary:     Effort: Pulmonary effort is normal. No respiratory distress.     Breath sounds: Normal breath sounds. No wheezing.  Abdominal:     General: There is no distension.     Palpations: Abdomen is soft.     Tenderness: There is no abdominal tenderness.  Musculoskeletal:        General: No tenderness. Normal range of motion.     Cervical back: Normal range of motion and neck supple.     Comments: Right shoulder, minimal discomfort at insertion of deltoid muscle.  No defect.  Full range of motion of shoulder.  No  weakness.  Lymphadenopathy:     Cervical: No cervical adenopathy.  Skin:    General: Skin is warm and dry.  Neurological:     Mental Status: He is alert and oriented to person, place, and time.     Deep Tendon Reflexes: Reflexes are normal and symmetric.  Psychiatric:        Behavior: Behavior normal.    Assessment & Plan:  Scott [name] is a 62 y.o. male . Annual physical exam - Plan: CBC with Differential/Platelet, Comprehensive metabolic panel, Lipid panel, PSA, TSH, Hemoglobin A1c - -anticipatory guidance as below in AVS, screening labs above. Health maintenance items as above in HPI discussed/recommended as applicable.   History of herpes genitalis - Plan: valACYclovir (VALTREX) 500 MG tablet  -Valtrex as needed  Hyperlipidemia, unspecified hyperlipidemia type - Plan: Comprehensive metabolic panel, Lipid panel  -Tolerating Crestor, continue same, check labs, option for repeat testing fasting if needed.  Hyperglycemia - Plan: Comprehensive metabolic panel, Hemoglobin A1c  -Check A1c.  Working on [employer] with diet changes.  Screening for prostate cancer - Plan: PSA  -History of prostate cancer, ongoing PSA monitoring.  Screening for thyroid disorder - Plan: TSH   Meds ordered this encounter  Medications   valACYclovir (VALTREX) 500 MG tablet    Sig: Take 1 tablet (500 mg total) by mouth 2 (two) times daily. For 3 days at time of flair.    Dispense:  24 tablet    Refill:  3   Patient Instructions  Call Dr. Haywood Pao office to determine timing of repeat  colonoscopy.  Right shoulder symptoms appears to be mostly at your deltoid muscle.  Stretches, massage, gentle range of motion is fine for now.  Unlikely frozen shoulder, but if that pain is not improving in the next week or 2, return for recheck and other testing.  Sooner if worse.  Allergy med, nasal spray if needed for occasional congestion.  Ears appear clear today.  Follow-up if that does not improve.  No med changes  at this time.  If any concerns on labs I will let you know or if we need to recheck this fasting.  Take care!  Preventive Care 29-32 Years Old, Male Preventive care refers to lifestyle choices and visits with your health care provider that can promote health and wellness. Preventive care visits are also called wellness exams. What can I expect for my preventive care visit? Counseling During your preventive care visit, your health care provider may ask about your: Medical history, including: Past medical problems. Family medical history. Current health, including: Emotional well-being. Home life and relationship well-being. Sexual activity. Lifestyle, including: Alcohol, nicotine or tobacco, and drug use. Access to firearms. Diet, exercise, and sleep habits. Safety issues such as seatbelt and bike helmet use. Sunscreen use. Work and work Astronomer. Physical exam Your health care provider will check your: Height and weight. These may be used to calculate your BMI (body mass index). BMI is a measurement that tells if you are at a healthy weight. Waist circumference. This measures the distance around your waistline. This measurement also tells if you are at a healthy weight and may help predict your risk of certain diseases, such as type 2 diabetes and high blood pressure. Heart rate and blood pressure. Body temperature. Skin for abnormal spots. What immunizations do I need?  Vaccines are usually given at various ages, according to a schedule. Your health care provider will recommend vaccines for you based on your age, medical history, and lifestyle or other factors, such as travel or where you work. What tests do I need? Screening Your health care provider may recommend screening tests for certain conditions. This may include: Lipid and cholesterol levels. Diabetes screening. This is done by checking your blood sugar (glucose) after you have not eaten for a while  (fasting). Hepatitis B test. Hepatitis C test. HIV (human immunodeficiency virus) test. STI (sexually transmitted infection) testing, if you are at risk. Lung cancer screening. Prostate cancer screening. Colorectal cancer screening. Talk with your health care provider about your test results, treatment options, and if necessary, the need for more tests. Follow these instructions at home: Eating and drinking  Eat a diet that includes fresh fruits and vegetables, whole grains, lean protein, and low-fat dairy products. Take vitamin and mineral supplements as recommended by your health care provider. Do not drink alcohol if your health care provider tells you not to drink. If you drink alcohol: Limit how much you have to 0-2 drinks a day. Know how much alcohol is in your drink. In the [place] one drink equals one 12 oz bottle of beer (355 mL), one 5 oz glass of wine (148 mL), or one 1 oz glass of hard liquor (44 mL). Lifestyle Brush your teeth every morning and night with fluoride toothpaste. Floss one time each day. Exercise for at least 30 minutes 5 or more days each week. Do not use any products that contain nicotine or tobacco. These products include cigarettes, chewing tobacco, and vaping devices, such as e-cigarettes. If you need help quitting, ask your  health care provider. Do not use drugs. If you are sexually active, practice safe sex. Use a condom or other form of protection to prevent STIs. Take aspirin only as told by your health care provider. Make sure that you understand how much to take and what form to take. Work with your health care provider to find out whether it is safe and beneficial for you to take aspirin daily. Find healthy ways to manage stress, such as: Meditation, yoga, or listening to music. Journaling. Talking to a trusted person. Spending time with friends and family. Minimize exposure to UV radiation to reduce your risk of skin cancer. Safety Always wear  your seat belt while driving or riding in a vehicle. Do not drive: If you have been drinking alcohol. Do not ride with someone who has been drinking. When you are tired or distracted. While texting. If you have been using any mind-altering substances or drugs. Wear a helmet and other protective equipment during sports activities. If you have firearms in your house, make sure you follow all gun safety procedures. What's next? Go to your health care provider once a year for an annual wellness visit. Ask your health care provider how often you should have your eyes and teeth checked. Stay up to date on all vaccines. This information is not intended to replace advice given to you by your health care provider. Make sure you discuss any questions you have with your health care provider. Document Revised: 09/10/2020 Document Reviewed: 09/10/2020 Elsevier Patient Education  2024 Elsevier Inc.     Signed,   Meredith Staggers, MD [facility], [facility] 02/21/23 8:39 AM

## 2023-02-21 NOTE — Patient Instructions (Addendum)
Call Dr. Haywood Pao office to determine timing of repeat colonoscopy.  Right shoulder symptoms appears to be mostly at your deltoid muscle.  Stretches, massage, gentle range of motion is fine for now.  Unlikely frozen shoulder, but if that pain is not improving in the next week or 2, return for recheck and other testing.  Sooner if worse.  Allergy med, nasal spray if needed for occasional congestion.  Ears appear clear today.  Follow-up if that does not improve.  No med changes at this time.  If any concerns on labs I will let you know or if we need to recheck this fasting.  Take care!  Preventive Care 27-74 Years Old, Male Preventive care refers to lifestyle choices and visits with your health care provider that can promote health and wellness. Preventive care visits are also called wellness exams. What can I expect for my preventive care visit? Counseling During your preventive care visit, your health care provider may ask about your: Medical history, including: Past medical problems. Family medical history. Current health, including: Emotional well-being. Home life and relationship well-being. Sexual activity. Lifestyle, including: Alcohol, nicotine or tobacco, and drug use. Access to firearms. Diet, exercise, and sleep habits. Safety issues such as seatbelt and bike helmet use. Sunscreen use. Work and work Astronomer. Physical exam Your health care provider will check your: Height and weight. These may be used to calculate your BMI (body mass index). BMI is a measurement that tells if you are at a healthy weight. Waist circumference. This measures the distance around your waistline. This measurement also tells if you are at a healthy weight and may help predict your risk of certain diseases, such as type 2 diabetes and high blood pressure. Heart rate and blood pressure. Body temperature. Skin for abnormal spots. What immunizations do I need?  Vaccines are usually given at various  ages, according to a schedule. Your health care provider will recommend vaccines for you based on your age, medical history, and lifestyle or other factors, such as travel or where you work. What tests do I need? Screening Your health care provider may recommend screening tests for certain conditions. This may include: Lipid and cholesterol levels. Diabetes screening. This is done by checking your blood sugar (glucose) after you have not eaten for a while (fasting). Hepatitis B test. Hepatitis C test. HIV (human immunodeficiency virus) test. STI (sexually transmitted infection) testing, if you are at risk. Lung cancer screening. Prostate cancer screening. Colorectal cancer screening. Talk with your health care provider about your test results, treatment options, and if necessary, the need for more tests. Follow these instructions at home: Eating and drinking  Eat a diet that includes fresh fruits and vegetables, whole grains, lean protein, and low-fat dairy products. Take vitamin and mineral supplements as recommended by your health care provider. Do not drink alcohol if your health care provider tells you not to drink. If you drink alcohol: Limit how much you have to 0-2 drinks a day. Know how much alcohol is in your drink. In the [place] one drink equals one 12 oz bottle of beer (355 mL), one 5 oz glass of wine (148 mL), or one 1 oz glass of hard liquor (44 mL). Lifestyle Brush your teeth every morning and night with fluoride toothpaste. Floss one time each day. Exercise for at least 30 minutes 5 or more days each week. Do not use any products that contain nicotine or tobacco. These products include cigarettes, chewing tobacco, and vaping devices, such as  e-cigarettes. If you need help quitting, ask your health care provider. Do not use drugs. If you are sexually active, practice safe sex. Use a condom or other form of protection to prevent STIs. Take aspirin only as told by your health  care provider. Make sure that you understand how much to take and what form to take. Work with your health care provider to find out whether it is safe and beneficial for you to take aspirin daily. Find healthy ways to manage stress, such as: Meditation, yoga, or listening to music. Journaling. Talking to a trusted person. Spending time with friends and family. Minimize exposure to UV radiation to reduce your risk of skin cancer. Safety Always wear your seat belt while driving or riding in a vehicle. Do not drive: If you have been drinking alcohol. Do not ride with someone who has been drinking. When you are tired or distracted. While texting. If you have been using any mind-altering substances or drugs. Wear a helmet and other protective equipment during sports activities. If you have firearms in your house, make sure you follow all gun safety procedures. What's next? Go to your health care provider once a year for an annual wellness visit. Ask your health care provider how often you should have your eyes and teeth checked. Stay up to date on all vaccines. This information is not intended to replace advice given to you by your health care provider. Make sure you discuss any questions you have with your health care provider. Document Revised: 09/10/2020 Document Reviewed: 09/10/2020 Elsevier Patient Education  2024 [employer].

## 2023-03-09 ENCOUNTER — Encounter: Payer: Self-pay | Admitting: Adult Health

## 2023-03-09 NOTE — Telephone Encounter (Signed)
Order sent to [facility].

## 2023-03-10 DIAGNOSIS — G4733 Obstructive sleep apnea (adult) (pediatric): Secondary | ICD-10-CM | POA: Diagnosis not present

## 2023-03-10 NOTE — Telephone Encounter (Signed)
New, Maryella Shivers, Otilio Jefferson, RN; [place], Morton Amy, [facility] [address],  We recomend Cpap. Com for all travle pap needs. I think they work with insruance on travel units.  Thank you,  Luellen Pucker

## 2023-04-10 DIAGNOSIS — G4733 Obstructive sleep apnea (adult) (pediatric): Secondary | ICD-10-CM | POA: Diagnosis not present

## 2023-05-11 DIAGNOSIS — G4733 Obstructive sleep apnea (adult) (pediatric): Secondary | ICD-10-CM | POA: Diagnosis not present

## 2023-07-18 ENCOUNTER — Encounter: Payer: Self-pay | Admitting: Adult Health

## 2023-07-18 DIAGNOSIS — G4733 Obstructive sleep apnea (adult) (pediatric): Secondary | ICD-10-CM

## 2023-07-18 NOTE — Telephone Encounter (Signed)
 Travel CPAP order signed.

## 2023-07-21 NOTE — Telephone Encounter (Signed)
 Called pt and LMVM for him on mobile that do have prescription for him for travel cpap,  we can email the order to him.  He can mychart or call back to let us  know what he wants to do.

## 2023-07-25 ENCOUNTER — Encounter: Payer: Self-pay | Admitting: Oncology

## 2023-07-25 ENCOUNTER — Other Ambulatory Visit: Payer: Self-pay

## 2023-07-25 ENCOUNTER — Ambulatory Visit (INDEPENDENT_AMBULATORY_CARE_PROVIDER_SITE_OTHER): Payer: Self-pay | Admitting: Oncology

## 2023-07-25 VITALS — BP 137/80 | HR 64 | Temp 98.0°F | Ht 74.25 in | Wt 266.0 lb

## 2023-07-25 DIAGNOSIS — R051 Acute cough: Secondary | ICD-10-CM

## 2023-07-25 MED ORDER — AZITHROMYCIN 250 MG PO TABS: ORAL_TABLET | ORAL | 0 refills | Status: AC

## 2023-07-25 MED ORDER — METHYLPREDNISOLONE 4 MG PO TBPK: ORAL_TABLET | ORAL | 0 refills | Status: DC

## 2023-07-25 NOTE — Progress Notes (Signed)
 [facility]  [address] [place], [place] [address] Phone: [phone] Fax: [phone]   Office Visit Note  Patient Name: Scott [name]  Date of PIRJJ:[id number]  Med Rec number [id number]  Date of Service: 07/25/2023  Patient has no known allergies.  Chief Complaint  Patient presents with   Acute Visit    Patient reports feeling fullness in his ears, nasal congestion, and a persistent dry cough. Scott [name] also reports feeling more SOB when walking on an incline which Scott [name] typically does daily. Symptoms began about 10 days ago. Scott [name] has not had any difficulty using his CPAP at night because Scott [name] feels the congestion draining when Scott [name] lies down. Scott [name] takes Tylenol  when the cough causes him to have a headache. Scott [name] also took OTC fexofenadine x 3 days which only seemed to dry him out.      HPI Patient is an 63 y.o. male who presents for bilateral ear fullness, nasal congestion and dry cough.  Symptoms started approximately 10 days ago and have lingered.  Feels slightly more short of breath during his daily morning walks especially when Scott [name] is walking uphill.  Reports his cough is minimally productive.  Denies any history of respiratory issues.  No fevers.  Reports a headache when Scott [name] coughs a lot.  Wife had something very similar and has gotten better.  Scott [name] tested himself for flu and COVID last Monday and both were negative.  Scott [name] is taken occasionally Tylenol  and Allegra with minimal improvement of his symptoms.  Feels like the Allegra may have dried him up a little bit.  NKDA. No recent anitbioctics.   Current Medication:  Outpatient Encounter Medications as of 07/25/2023  Medication Sig   Ascorbic Acid (VITAMIN C) 1000 MG tablet Take 1,000 mg by mouth. Takes occassionally   azithromycin (ZITHROMAX) 250 MG tablet Take 2 tablets on day 1, then 1 tablet daily on days 2 through 5   methylPREDNISolone (MEDROL DOSEPAK) 4 MG TBPK tablet Take as directed.   rosuvastatin  (CRESTOR ) 40 MG tablet  Take 1 tablet (40 mg total) by mouth daily.   tadalafil  (CIALIS ) 5 MG tablet Take 1 tablet (5 mg total) by mouth daily as needed.   valACYclovir  (VALTREX ) 500 MG tablet Take 1 tablet (500 mg total) by mouth 2 (two) times daily. For 3 days at time of flair. (Patient taking differently: Take 500 mg by mouth 2 (two) times daily as needed. For 3 days at time of flair.)   VITAMIN D , CHOLECALCIFEROL , PO Take by mouth.   No facility-administered encounter medications on file as of 07/25/2023.      Medical History: Past Medical History:  Diagnosis Date   Asperger's syndrome    Never formally diagnosed - potential traits of   Herpes simplex type II infection    Hyperlipidemia    Malignant neoplasm of prostate (HCC)    Multiple lipomas    Premature ejaculation 07/2004   Pulmonary embolism (HCC) 04/2009   Unspecified vitamin D  deficiency      Vital Signs: BP 137/80   Pulse 64   Temp 98 F (36.7 C)   Ht 6' 2.25" (1.886 m)   Wt 266 lb (120.7 kg)   SpO2 94%   BMI 33.92 kg/m   ROS: As per HPI.  All other pertinent ROS negative.     Review of Systems  Constitutional:  Negative for chills, diaphoresis and fatigue.  HENT:  Positive for congestion, ear pain and postnasal drip. Negative for sinus pressure,  sinus pain and sore throat.   Respiratory:  Positive for cough, shortness of breath and wheezing.   Gastrointestinal:  Negative for diarrhea, nausea and vomiting.  Musculoskeletal:  Negative for arthralgias and myalgias.  Neurological:  Positive for headaches. Negative for dizziness.    Physical Exam Constitutional:      Appearance: Normal appearance.  HENT:     Right Ear: A middle ear effusion is present.     Left Ear: A middle ear effusion is present.     Nose: No congestion or rhinorrhea.     Right Turbinates: Not swollen.     Left Turbinates: Not swollen.     Right Sinus: No maxillary sinus tenderness or frontal sinus tenderness.     Left Sinus: No maxillary sinus tenderness  or frontal sinus tenderness.     Mouth/Throat:     Mouth: Mucous membranes are moist.     Pharynx: Posterior oropharyngeal erythema and postnasal drip present.     Tonsils: No tonsillar exudate. 0 on the right. 0 on the left.  Cardiovascular:     Rate and Rhythm: Normal rate.  Pulmonary:     Effort: Pulmonary effort is normal. Prolonged expiration present.     Breath sounds: Decreased air movement present. Examination of the right-lower field reveals wheezing. Wheezing present.  Neurological:     Mental Status: Scott [name] is alert.     No results found for this or any previous visit (from the past 24 hours).  Assessment/Plan: 1. Bronchitis (Primary) Exam is concerning for bronchitis.  We discussed trying an antibiotic and oral steroids given the right lower lobe wheezing on exam.  Discussed how and when to take azithromycin and methylprednisone.  Reports Scott [name] has been on steroids in the past and tolerated well.  Recommend taking steroids in the morning to avoid sleep disruption.  You are okay to take both prescriptions together.  Take with food.  Continue OTC medications with antihistamines, cough suppressants and Flonase nasal steroid.  Please do not take NSAIDs with oral steroids.  You may use Tylenol  as needed.  If your symptoms are not improved by Friday, would recommend chest x-ray.  Please call clinic with concerns.  - methylPREDNISolone (MEDROL DOSEPAK) 4 MG TBPK tablet; Take as directed.  Dispense: 21 tablet; Refill: 0 - azithromycin (ZITHROMAX) 250 MG tablet; Take 2 tablets on day 1, then 1 tablet daily on days 2 through 5  Dispense: 6 tablet; Refill: 0   General Counseling: Scott [name] verbalizes understanding of the findings of todays visit and agrees with plan of treatment. I have discussed any further diagnostic evaluation that may be needed or ordered today. We also reviewed his medications today. Scott [name] has been encouraged to call the office with any questions or concerns that should arise  related to todays visit.   No orders of the defined types were placed in this encounter.   Meds ordered this encounter  Medications   methylPREDNISolone (MEDROL DOSEPAK) 4 MG TBPK tablet    Sig: Take as directed.    Dispense:  21 tablet    Refill:  0   azithromycin (ZITHROMAX) 250 MG tablet    Sig: Take 2 tablets on day 1, then 1 tablet daily on days 2 through 5    Dispense:  6 tablet    Refill:  0    I spent 20 minutes dedicated to the care of this patient (face-to-face and non-face-to-face) on the date of the encounter to include what is described in the assessment and  plan.   Charlton Cooler, NP 07/25/2023 8:35 AM

## 2023-07-27 DIAGNOSIS — Z1211 Encounter for screening for malignant neoplasm of colon: Secondary | ICD-10-CM | POA: Diagnosis not present

## 2023-08-17 DIAGNOSIS — D225 Melanocytic nevi of trunk: Secondary | ICD-10-CM | POA: Diagnosis not present

## 2023-08-17 DIAGNOSIS — L448 Other specified papulosquamous disorders: Secondary | ICD-10-CM | POA: Diagnosis not present

## 2023-08-17 DIAGNOSIS — L814 Other melanin hyperpigmentation: Secondary | ICD-10-CM | POA: Diagnosis not present

## 2023-08-17 DIAGNOSIS — L821 Other seborrheic keratosis: Secondary | ICD-10-CM | POA: Diagnosis not present

## 2023-08-24 DIAGNOSIS — M1990 Unspecified osteoarthritis, unspecified site: Secondary | ICD-10-CM | POA: Insufficient documentation

## 2023-08-24 DIAGNOSIS — K5730 Diverticulosis of large intestine without perforation or abscess without bleeding: Secondary | ICD-10-CM | POA: Insufficient documentation

## 2023-08-24 DIAGNOSIS — Z1211 Encounter for screening for malignant neoplasm of colon: Secondary | ICD-10-CM | POA: Insufficient documentation

## 2023-08-24 DIAGNOSIS — E782 Mixed hyperlipidemia: Secondary | ICD-10-CM | POA: Insufficient documentation

## 2023-08-25 ENCOUNTER — Ambulatory Visit: Payer: BC Managed Care – PPO | Admitting: Family Medicine

## 2023-08-25 VITALS — BP 126/70 | HR 66 | Temp 97.8°F | Resp 16 | Ht 74.25 in | Wt 262.0 lb

## 2023-08-25 DIAGNOSIS — H6121 Impacted cerumen, right ear: Secondary | ICD-10-CM

## 2023-08-25 DIAGNOSIS — Z8619 Personal history of other infectious and parasitic diseases: Secondary | ICD-10-CM

## 2023-08-25 DIAGNOSIS — N529 Male erectile dysfunction, unspecified: Secondary | ICD-10-CM

## 2023-08-25 DIAGNOSIS — E785 Hyperlipidemia, unspecified: Secondary | ICD-10-CM | POA: Diagnosis not present

## 2023-08-25 LAB — COMPREHENSIVE METABOLIC PANEL WITH GFR
ALT: 23 U/L (ref 0–53)
AST: 25 U/L (ref 0–37)
Albumin: 4.6 g/dL (ref 3.5–5.2)
Alkaline Phosphatase: 63 U/L (ref 39–117)
BUN: 13 mg/dL (ref 6–23)
CO2: 30 meq/L (ref 19–32)
Calcium: 9.6 mg/dL (ref 8.4–10.5)
Chloride: 104 meq/L (ref 96–112)
Creatinine, Ser: 0.88 mg/dL (ref 0.40–1.50)
GFR: 91.2 mL/min (ref >60.00)
Glucose, Bld: 91 mg/dL (ref 70–99)
Potassium: 5.1 meq/L (ref 3.5–5.1)
Sodium: 140 meq/L (ref 135–145)
Total Bilirubin: 0.6 mg/dL (ref 0.2–1.2)
Total Protein: 7.3 g/dL (ref 6.0–8.3)

## 2023-08-25 LAB — LIPID PANEL
Cholesterol: 204 mg/dL — ABNORMAL HIGH (ref 0–200)
HDL: 58.4 mg/dL (ref >39.00)
LDL Cholesterol: 133 mg/dL — ABNORMAL HIGH (ref 0–99)
NonHDL: 145.14
Total CHOL/HDL Ratio: 3
Triglycerides: 59 mg/dL (ref 0.0–149.0)
VLDL: 11.8 mg/dL (ref 0.0–40.0)

## 2023-08-25 MED ORDER — TADALAFIL 5 MG PO TABS: 5.0000 mg | ORAL_TABLET | Freq: Every day | ORAL | 11 refills | Status: AC | PRN

## 2023-08-25 MED ORDER — ROSUVASTATIN CALCIUM 40 MG PO TABS: 40.0000 mg | ORAL_TABLET | Freq: Every day | ORAL | 3 refills | Status: AC

## 2023-08-25 MED ORDER — VALACYCLOVIR HCL 500 MG PO TABS: 500.0000 mg | ORAL_TABLET | Freq: Two times a day (BID) | ORAL | 3 refills | Status: AC

## 2023-08-25 NOTE — Patient Instructions (Signed)
 Thanks for coming in today.  No medication changes at this time.  If any concerns on labs I will let you know.  Have a great trip in July.  Follow-up 6 months, for physical at that time. Take care!  Earwax Buildup, Adult Your ears make something called earwax. It helps keep germs called bacteria away and protects the skin in your ears. Sometimes, too much earwax can build up. This can cause discomfort or make it harder to hear. What are the causes? Earwax buildup can happen when you have too much earwax in your ears. Earwax is made in the outer part of your ear canal. It's supposed to fall out in small amounts over time. But if your ears aren't able to clean themselves like they should, earwax can build up. What increases the risk? You're more likely to get earwax buildup if: You clean your ears with cotton swabs. You pick at your ears. You use earplugs or in-ear headphones a lot. You wear hearing aids. You may also be more likely to get it if: You're male. You're older. Your ears naturally make more earwax. You have narrow ear canals or extra hair in your ears. Your earwax is too thick or sticky. You have eczema. You're dehydrated. This means there's not enough fluid in your body. What are the signs or symptoms? Symptoms of earwax buildup include: Not being able to hear as well. A feeling of fullness in your ear. Feeling like your ear is plugged. Fluid coming from your ear. Ear pain or an itchy ear. Ringing in your ear. Coughing or problems with balance. How is this diagnosed? Earwax buildup may be diagnosed based on your symptoms, medical history, and an ear exam. During the exam, your health care provider will look into your ear with a tool called an otoscope. You may also have tests, such as a hearing test. How is this treated? Earwax buildup may be treated by: Using ear drops. Having the earwax removed by a provider. The provider may: Flush the ear with water. Use a tool  called a curette that has a loop on the end. Use a suction device. Having surgery. This may be done in severe cases. Follow these instructions at home:  Cleaning your ears Clean your ears as told by your provider. You can clean the outside of your ears with a washcloth or tissue. Do not overclean your ears. Do not put anything into your ear unless told. This includes cotton swabs. General instructions Take over-the-counter and prescription medicines only as told by your provider. Drink enough fluid to keep your pee (urine) pale yellow. This helps thin the earwax. If you have hearing aids, clean them as told. Keep all follow-up visits. If earwax builds up in your ears often or if you use hearing aids, ask your provider how often you should have your ears cleaned. Contact a health care provider if: Your ear pain gets worse. You have a fever. You have pus, blood, or other fluid coming from your ear. You have hearing loss. You have ringing in your ears that won't go away. You feel like the room is spinning. This is called vertigo. Your symptoms don't get better with treatment. This information is not intended to replace advice given to you by your health care provider. Make sure you discuss any questions you have with your health care provider. Document Revised: 05/27/2022 Document Reviewed: 05/27/2022 Elsevier Patient Education  2024 [employer].

## 2023-08-25 NOTE — Progress Notes (Unsigned)
 Subjective:  Patient ID: Scott [name], male    DOB: 30-Jul-1960  Age: 63 y.o. MRN: [id number]  CC:  Chief Complaint  Patient presents with   Annual Exam    Pt requests ear check on the Rt side notes intermittent "clogged" feeling    HPI Scott [name] presents for Annual Exam -did have annual exam in November 2024. Will revert to follow up today.   Trip to [place] in July for 2 weeks.   PCP, [facility], Dr. Nickey Barn, colonoscopy 07/27/2023 Dermatology, Dr. Martina Sledge, melanocytic nevi, Seborrheic keratosis, appointment May 2024. Urology,, history of prostate cancer status post radical prostatectomy.  Remote treatment, eval with urology in 2021 and been over 10 years since treatment, plan for follow-up PSAs here. Lab Results  Component Value Date   PSA 0.01 (L) 02/21/2023   PSA 0.00 (L) 02/11/2022   PSA 0.00 Repeated and verified X2. (L) 08/04/2020   Hyperlipidemia: Treated Crestor  40 mg daily, no new myalgias/side effects.   Lab Results  Component Value Date   CHOL 194 02/21/2023   HDL 46.30 02/21/2023   LDLCALC 130 (H) 02/21/2023   TRIG 91.0 02/21/2023   CHOLHDL 4 02/21/2023   Lab Results  Component Value Date   ALT 14 02/21/2023   AST 15 02/21/2023   ALKPHOS 65 02/21/2023   BILITOT 0.8 02/21/2023   History of HSV Treated with Valtrex  500 mg-uses as needed for flares, 3 to 4/year, working well.  Erectile dysfunction Cialis  5 mg - no ha/flushing/side effects. No CP/DOE, no hearing/vision changes. Effective at 5mg  dose.   OSA on CPAP Visit with sleep specialist in November 2024.  Good compliance and treatment at that time.  Option of travel machine. option of referral for inspire, 1 year follow-up. Recently ordered a travel machine. Holding on [place] for now.   Ear fullness Intermittent on R side - past several months, not persistent. No home treatments. No significant sinus congestion.     History Patient Active Problem List   Diagnosis Date Noted   Arthritis  08/24/2023   Diverticular disease of colon 08/24/2023   Screening for malignant neoplasm of colon 08/24/2023   Mixed hyperlipidemia 08/24/2023   Infected epidermoid cyst 09/08/2020   OSA on CPAP 03/11/2017   Obesity 07/10/2015   Hyperlipidemia    Herpes simplex type II infection    Past Medical History:  Diagnosis Date   Asperger's syndrome    Never formally diagnosed - potential traits of   Herpes simplex type II infection    Hyperlipidemia    Malignant neoplasm of prostate (HCC)    Multiple lipomas    Premature ejaculation 07/2004   Pulmonary embolism (HCC) 04/2009   Unspecified vitamin D  deficiency    Past Surgical History:  Procedure Laterality Date   CATARACT EXTRACTION, BILATERAL     EYE SURGERY N/A    [employer] 02/01/2020   Hip repalcement  2012   JOINT REPLACEMENT N/A    [employer] 02/01/2020   KNEE ARTHROSCOPY W/ MENISCAL REPAIR Left 12/2014   PROSTATE SURGERY N/A    [employer] 02/01/2020   replacment     Lt hip Feb 2022   SHOULDER OPEN ROTATOR CUFF REPAIR     XI ROBOTIC ASSISTED SIMPLE PROSTATECTOMY  05/21/2009   Not on File Prior to Admission medications   Medication Sig Start Date End Date Taking? Authorizing Provider  Ascorbic Acid (VITAMIN C) 1000 MG tablet Take 1,000 mg by mouth. Takes occassionally   Yes [provider]  rosuvastatin  (  CRESTOR ) 40 MG tablet Take 1 tablet (40 mg total) by mouth daily. 08/13/22  Yes Benjiman Bras, MD  tadalafil  (CIALIS ) 5 MG tablet Take 1 tablet (5 mg total) by mouth daily as needed. Patient taking differently: Take 5 mg by mouth daily as needed. Pt notes this is almost daily 08/13/22  Yes Benjiman Bras, MD  valACYclovir  (VALTREX ) 500 MG tablet Take 1 tablet (500 mg total) by mouth 2 (two) times daily. For 3 days at time of flair. Patient taking differently: Take 500 mg by mouth 2 (two) times daily as needed. For 3 days at time of flair. 02/21/23  Yes Benjiman Bras, MD  VITAMIN D , CHOLECALCIFEROL , PO Take by  mouth.   Yes [provider]   Social History   Socioeconomic History   Marital status: Married    Spouse name: Vaunda   Number of children: 2   Years of education: 18   Highest education level: Master's degree (e.g., MA, MS, MEng, MEd, MSW, MBA)  Occupational History   Not on file  Tobacco Use   Smoking status: Light Smoker    Types: Cigars   Smokeless tobacco: Never   Tobacco comments:    Smokes cigar when playing golf quit smoking cigarettes in 2004   Vaping Use   Vaping status: Never Used  Substance and Sexual Activity   Alcohol use: Yes    Alcohol/week: 4.0 standard drinks of alcohol    Types: 2 Cans of beer, 2 Shots of liquor per week   Drug use: No   Sexual activity: Yes  Other Topics Concern   Not on file  Social History Narrative   Not on file   Social Drivers of Health   Financial Resource Strain: Low Risk  (08/21/2023)   Overall Financial Resource Strain (CARDIA)    Difficulty of Paying Living Expenses: Not hard at all  Food Insecurity: No Food Insecurity (08/21/2023)   Hunger Vital Sign    Worried About Running Out of Food in the Last Year: Never true    Ran Out of Food in the Last Year: Never true  Transportation Needs: No Transportation Needs (08/21/2023)   PRAPARE - Administrator, Civil Service (Medical): No    Lack of Transportation (Non-Medical): No  Physical Activity: Sufficiently Active (08/21/2023)   Exercise Vital Sign    Days of Exercise per Week: 5 days    Minutes of Exercise per Session: 60 min  Stress: No Stress Concern Present (08/21/2023)   [employer] of Occupational Health - Occupational Stress Questionnaire    Feeling of Stress : Not at all  Social Connections: Moderately Isolated (08/21/2023)   Social Connection and Isolation Panel [NHANES]    Frequency of Communication with Friends and Family: Once a week    Frequency of Social Gatherings with Friends and Family: More than three times a week    Attends  Religious Services: Never    Database administrator or Organizations: No    Attends Engineer, structural: Not on file    Marital Status: Married  Catering manager Violence: Not on file    Review of Systems   Objective:   Vitals:   08/25/23 0755  BP: 126/70  Pulse: 66  Resp: 16  Temp: 97.8 F (36.6 C)  TempSrc: Temporal  SpO2: 96%  Weight: 262 lb (118.8 kg)  Height: 6' 2.25" (1.886 m)   {Vitals History (Optional):23777}  Physical Exam Vitals reviewed.  Constitutional:  Appearance: He is well-developed.  HENT:     Head: Normocephalic and atraumatic.     Right Ear: There is no impacted cerumen (dark yellow cerumen, narrow canal.).  Neck:     Vascular: No carotid bruit or JVD.  Cardiovascular:     Rate and Rhythm: Normal rate and regular rhythm.     Heart sounds: Normal heart sounds. No murmur heard. Pulmonary:     Effort: Pulmonary effort is normal.     Breath sounds: Normal breath sounds. No rales.  Musculoskeletal:     Right lower leg: No edema.     Left lower leg: No edema.  Skin:    General: Skin is warm and dry.  Neurological:     Mental Status: He is alert and oriented to person, place, and time.  Psychiatric:        Mood and Affect: Mood normal.    Risks (including but not limited to injury to canal, TM, benefits, and alternatives including ENT eval discussed for cerumen lavage on R.   Verbal consent obtained after any questions were answered. Initial attempt to remove by me with lighter curette unsuccessful. Lavage by CMA.   Repeat exam after lavage, clearing of cerumen, canal and TM appear normal, no sign of perforation.  Hearing well, no complications.  Assessment & Plan:  Scott [name] is a 63 y.o. male . Hyperlipidemia, unspecified hyperlipidemia type - Plan: Comprehensive metabolic panel with GFR, Lipid panel, rosuvastatin  (CRESTOR ) 40 MG tablet  Erectile dysfunction, unspecified erectile dysfunction type - Plan: tadalafil   (CIALIS ) 5 MG tablet  History of herpes genitalis - Plan: valACYclovir  (VALTREX ) 500 MG tablet  Impacted cerumen of right ear - Plan: Ear wax removal   Meds ordered this encounter  Medications   rosuvastatin  (CRESTOR ) 40 MG tablet    Sig: Take 1 tablet (40 mg total) by mouth daily.    Dispense:  90 tablet    Refill:  3   tadalafil  (CIALIS ) 5 MG tablet    Sig: Take 1 tablet (5 mg total) by mouth daily as needed.    Dispense:  30 tablet    Refill:  11   valACYclovir  (VALTREX ) 500 MG tablet    Sig: Take 1 tablet (500 mg total) by mouth 2 (two) times daily. For 3 days at time of flair.    Dispense:  24 tablet    Refill:  3   Patient Instructions  Thanks for coming in today.  No medication changes at this time.  If any concerns on labs I will let you know.  Have a great trip in July.  Follow-up 6 months, for physical at that time. Take care!  Earwax Buildup, Adult Your ears make something called earwax. It helps keep germs called bacteria away and protects the skin in your ears. Sometimes, too much earwax can build up. This can cause discomfort or make it harder to hear. What are the causes? Earwax buildup can happen when you have too much earwax in your ears. Earwax is made in the outer part of your ear canal. It's supposed to fall out in small amounts over time. But if your ears aren't able to clean themselves like they should, earwax can build up. What increases the risk? You're more likely to get earwax buildup if: You clean your ears with cotton swabs. You pick at your ears. You use earplugs or in-ear headphones a lot. You wear hearing aids. You may also be more likely to get it if: You're  male. You're older. Your ears naturally make more earwax. You have narrow ear canals or extra hair in your ears. Your earwax is too thick or sticky. You have eczema. You're dehydrated. This means there's not enough fluid in your body. What are the signs or symptoms? Symptoms of earwax  buildup include: Not being able to hear as well. A feeling of fullness in your ear. Feeling like your ear is plugged. Fluid coming from your ear. Ear pain or an itchy ear. Ringing in your ear. Coughing or problems with balance. How is this diagnosed? Earwax buildup may be diagnosed based on your symptoms, medical history, and an ear exam. During the exam, your health care provider will look into your ear with a tool called an otoscope. You may also have tests, such as a hearing test. How is this treated? Earwax buildup may be treated by: Using ear drops. Having the earwax removed by a provider. The provider may: Flush the ear with water. Use a tool called a curette that has a loop on the end. Use a suction device. Having surgery. This may be done in severe cases. Follow these instructions at home:  Cleaning your ears Clean your ears as told by your provider. You can clean the outside of your ears with a washcloth or tissue. Do not overclean your ears. Do not put anything into your ear unless told. This includes cotton swabs. General instructions Take over-the-counter and prescription medicines only as told by your provider. Drink enough fluid to keep your pee (urine) pale yellow. This helps thin the earwax. If you have hearing aids, clean them as told. Keep all follow-up visits. If earwax builds up in your ears often or if you use hearing aids, ask your provider how often you should have your ears cleaned. Contact a health care provider if: Your ear pain gets worse. You have a fever. You have pus, blood, or other fluid coming from your ear. You have hearing loss. You have ringing in your ears that won't go away. You feel like the room is spinning. This is called vertigo. Your symptoms don't get better with treatment. This information is not intended to replace advice given to you by your health care provider. Make sure you discuss any questions you have with your health care  provider. Document Revised: 05/27/2022 Document Reviewed: 05/27/2022 Elsevier Patient Education  2024 [employer]      Signed,   Caro Christmas, MD [facility], [facility] 08/25/23 8:35 AM

## 2023-08-26 ENCOUNTER — Encounter: Payer: Self-pay | Admitting: Family Medicine

## 2023-08-30 ENCOUNTER — Ambulatory Visit: Payer: Self-pay | Admitting: Family Medicine

## 2023-08-30 DIAGNOSIS — Z1211 Encounter for screening for malignant neoplasm of colon: Secondary | ICD-10-CM | POA: Diagnosis not present

## 2023-08-30 DIAGNOSIS — K635 Polyp of colon: Secondary | ICD-10-CM | POA: Diagnosis not present

## 2023-08-30 DIAGNOSIS — Z860101 Personal history of adenomatous and serrated colon polyps: Secondary | ICD-10-CM | POA: Diagnosis not present

## 2023-08-30 DIAGNOSIS — K5730 Diverticulosis of large intestine without perforation or abscess without bleeding: Secondary | ICD-10-CM | POA: Diagnosis not present

## 2023-08-30 DIAGNOSIS — D123 Benign neoplasm of transverse colon: Secondary | ICD-10-CM | POA: Diagnosis not present

## 2023-08-30 DIAGNOSIS — D120 Benign neoplasm of cecum: Secondary | ICD-10-CM | POA: Diagnosis not present

## 2024-01-27 DIAGNOSIS — R040 Epistaxis: Secondary | ICD-10-CM | POA: Diagnosis not present

## 2024-02-03 ENCOUNTER — Encounter: Payer: Self-pay | Admitting: Physician Assistant

## 2024-02-03 ENCOUNTER — Ambulatory Visit (INDEPENDENT_AMBULATORY_CARE_PROVIDER_SITE_OTHER): Payer: Self-pay | Admitting: Physician Assistant

## 2024-02-03 VITALS — BP 124/82 | HR 67 | Temp 97.4°F | Ht 74.25 in

## 2024-02-03 DIAGNOSIS — Z87898 Personal history of other specified conditions: Secondary | ICD-10-CM

## 2024-02-03 NOTE — Progress Notes (Signed)
 Therapist, [facility] [address] [place], [place] [address]   Office Visit Note  Patient Name: Scott [name] Date of Birth [id number]  Medical Record number [id number]  Date of Service: 02/03/2024  Chief Complaint  Patient presents with   Acute Visit    Patient had three nosebleeds last week while in [place]. He went to an Urgent Care and had a superficial vessel cauterized. The provider advised him to F/U in one week to make sure it was healing.     HPI Pt is here for a visit. States he was in [place] last week and Thurs to Fri he started developing epistaxis. Fri afternoon he went to UC and was told his nose was dry, needed silver nitrate cauterization to stop epistaxis, was told to f/up in a week to ensure it was healing.  No further epistaxis. No pain or drainage. No other complaints or concerns.    ROS: Review of Systems  HENT:  Negative for nosebleeds (resolved).      Current Medication:  Outpatient Encounter Medications as of 02/03/2024  Medication Sig   Ascorbic Acid (VITAMIN C) 1000 MG tablet Take 1,000 mg by mouth. Takes occassionally   rosuvastatin  (CRESTOR ) 40 MG tablet Take 1 tablet (40 mg total) by mouth daily.   tadalafil  (CIALIS ) 5 MG tablet Take 1 tablet (5 mg total) by mouth daily as needed.   valACYclovir  (VALTREX ) 500 MG tablet Take 1 tablet (500 mg total) by mouth 2 (two) times daily. For 3 days at time of flair. (Patient taking differently: Take 500 mg by mouth 2 (two) times daily as needed. For 3 days at time of flair.)   VITAMIN D , CHOLECALCIFEROL , PO Take by mouth.   No facility-administered encounter medications on file as of 02/03/2024.      Medical History: Past Medical History:  Diagnosis Date   Asperger's syndrome    Never formally diagnosed - potential traits of   Herpes simplex type II infection    Hyperlipidemia    Malignant neoplasm of prostate (HCC)    Multiple lipomas    Premature ejaculation 07/2004   Pulmonary embolism  (HCC) 04/2009   Unspecified vitamin D  deficiency      Vital Signs: BP 124/82   Pulse 67   Temp (!) 97.4 F (36.3 C)   Ht 6' 2.25 (1.886 m)   SpO2 98%   BMI 33.41 kg/m    Physical Exam Vitals and nursing note reviewed.  Constitutional:      General: He is not in acute distress.    Appearance: Normal appearance. He is well-developed. He is not toxic-appearing.     Comments: Afebrile, nontoxic, NAD  HENT:     Head: Normocephalic and atraumatic.     Nose: Signs of injury (scabbed area to L nostril where cauterized area was) present. No mucosal edema, congestion or rhinorrhea.     Right Nostril: No epistaxis.     Left Nostril: No epistaxis.     Mouth/Throat:     Mouth: Mucous membranes are moist.  Eyes:     General:        Right eye: No discharge.        Left eye: No discharge.     Conjunctiva/sclera: Conjunctivae normal.  Cardiovascular:     Rate and Rhythm: Normal rate.     Pulses: Normal pulses.  Pulmonary:     Effort: Pulmonary effort is normal. No respiratory distress.  Abdominal:     General: There is no distension.  Musculoskeletal:        General: Normal range of motion.     Cervical back: Normal range of motion and neck supple.  Skin:    General: Skin is warm and dry.     Findings: No rash.  Neurological:     Mental Status: He is alert and oriented to person, place, and time.     Sensory: Sensation is intact. No sensory deficit.     Motor: Motor function is intact.  Psychiatric:        Mood and Affect: Mood and affect normal.        Behavior: Behavior normal.       Assessment/Plan: 1. History of epistaxis (Primary)   Pt here for f/up on epistaxis s/p cauterization. Appears scabbed over, well healing. No further epistaxis.  Advised nasal saline and humidified air especially in the winter.  Can use Afrin if nosebleeds occur, no more than 3 days consecutively.  F/up with PCP as needed, or if epistaxis recurs then would see ENT.     General  Counseling: dagan heinz understanding of the findings of todays visit and agrees with plan of treatment. I have discussed any further diagnostic evaluation that may be needed or ordered today. We also reviewed his medications today. he has been encouraged to call the office with any questions or concerns that should arise related to todays visit.   No orders of the defined types were placed in this encounter.  No results found for this or any previous visit (from the past 24 hours).   No orders of the defined types were placed in this encounter.   Time spent: [address] [employer], [employer]

## 2024-02-09 DIAGNOSIS — G4733 Obstructive sleep apnea (adult) (pediatric): Secondary | ICD-10-CM | POA: Diagnosis not present

## 2024-02-23 ENCOUNTER — Other Ambulatory Visit: Payer: Self-pay | Admitting: Medical Genetics

## 2024-02-28 ENCOUNTER — Encounter: Payer: Self-pay | Admitting: Family Medicine

## 2024-02-29 ENCOUNTER — Ambulatory Visit: Admitting: Family Medicine

## 2024-02-29 ENCOUNTER — Encounter: Payer: Self-pay | Admitting: Family Medicine

## 2024-02-29 VITALS — BP 124/66 | HR 71 | Temp 97.7°F | Resp 16 | Ht 74.25 in | Wt 272.6 lb

## 2024-02-29 DIAGNOSIS — R519 Headache, unspecified: Secondary | ICD-10-CM

## 2024-02-29 DIAGNOSIS — E785 Hyperlipidemia, unspecified: Secondary | ICD-10-CM | POA: Diagnosis not present

## 2024-02-29 DIAGNOSIS — Z131 Encounter for screening for diabetes mellitus: Secondary | ICD-10-CM | POA: Diagnosis not present

## 2024-02-29 DIAGNOSIS — Z125 Encounter for screening for malignant neoplasm of prostate: Secondary | ICD-10-CM

## 2024-02-29 DIAGNOSIS — Z0000 Encounter for general adult medical examination without abnormal findings: Secondary | ICD-10-CM | POA: Diagnosis not present

## 2024-02-29 DIAGNOSIS — N529 Male erectile dysfunction, unspecified: Secondary | ICD-10-CM

## 2024-02-29 DIAGNOSIS — Z130 Encounter for screening for diseases of the blood and blood-forming organs and certain disorders involving the immune mechanism: Secondary | ICD-10-CM | POA: Diagnosis not present

## 2024-02-29 DIAGNOSIS — Z8619 Personal history of other infectious and parasitic diseases: Secondary | ICD-10-CM | POA: Diagnosis not present

## 2024-02-29 DIAGNOSIS — E559 Vitamin D deficiency, unspecified: Secondary | ICD-10-CM | POA: Diagnosis not present

## 2024-02-29 LAB — HEMOGLOBIN A1C: Hgb A1c MFr Bld: 5.5 % (ref 4.6–6.5)

## 2024-02-29 LAB — COMPREHENSIVE METABOLIC PANEL WITH GFR
ALT: 20 U/L (ref 0–53)
AST: 22 U/L (ref 0–37)
Albumin: 4.7 g/dL (ref 3.5–5.2)
Alkaline Phosphatase: 67 U/L (ref 39–117)
BUN: 14 mg/dL (ref 6–23)
CO2: 28 meq/L (ref 19–32)
Calcium: 9.6 mg/dL (ref 8.4–10.5)
Chloride: 103 meq/L (ref 96–112)
Creatinine, Ser: 0.82 mg/dL (ref 0.40–1.50)
GFR: 92.83 mL/min (ref >60.00)
Glucose, Bld: 96 mg/dL (ref 70–99)
Potassium: 4.6 meq/L (ref 3.5–5.1)
Sodium: 140 meq/L (ref 135–145)
Total Bilirubin: 0.8 mg/dL (ref 0.2–1.2)
Total Protein: 7.1 g/dL (ref 6.0–8.3)

## 2024-02-29 LAB — CBC
HCT: 43.5 % (ref 39.0–52.0)
Hemoglobin: 14.7 g/dL (ref 13.0–17.0)
MCHC: 33.9 g/dL (ref 30.0–36.0)
MCV: 95.3 fl (ref 78.0–100.0)
Platelets: 147 K/uL — ABNORMAL LOW (ref 150.0–400.0)
RBC: 4.57 Mil/uL (ref 4.22–5.81)
RDW: 13.1 % (ref 11.5–15.5)
WBC: 5.3 K/uL (ref 4.0–10.5)

## 2024-02-29 LAB — LIPID PANEL
Cholesterol: 195 mg/dL (ref 0–200)
HDL: 48.3 mg/dL (ref >39.00)
LDL Cholesterol: 131 mg/dL — ABNORMAL HIGH (ref 0–99)
NonHDL: 147.02
Total CHOL/HDL Ratio: 4
Triglycerides: 82 mg/dL (ref 0.0–149.0)
VLDL: 16.4 mg/dL (ref 0.0–40.0)

## 2024-02-29 LAB — PSA: PSA: 0 ng/mL — ABNORMAL LOW (ref 0.10–4.00)

## 2024-02-29 LAB — VITAMIN D 25 HYDROXY (VIT D DEFICIENCY, FRACTURES): VITD: 72.6 ng/mL (ref 30.00–100.00)

## 2024-02-29 NOTE — Patient Instructions (Signed)
 Try using Flonase nasal spray 1 spray in each nostril in the afternoon to see if some nasal congestion might be contributed to headache.  Make sure you are drinking plenty of fluids, see other information regarding headache below.  If not improving in the next week or 2, or any worsening symptoms sooner, please be seen.  Follow-up with new eye specialist.  I will check some screening labs today and if any concerns we will let you know.  Take care.   Preventive Care 44-37 Years Old, Male Preventive care refers to lifestyle choices and visits with your health care provider that can promote health and wellness. Preventive care visits are also called wellness exams. What can I expect for my preventive care visit? Counseling During your preventive care visit, your health care provider may ask about your: Medical history, including: Past medical problems. Family medical history. Current health, including: Emotional well-being. Home life and relationship well-being. Sexual activity. Lifestyle, including: Alcohol, nicotine or tobacco, and drug use. Access to firearms. Diet, exercise, and sleep habits. Safety issues such as seatbelt and bike helmet use. Sunscreen use. Work and work astronomer. Physical exam Your health care provider will check your: Height and weight. These may be used to calculate your BMI (body mass index). BMI is a measurement that tells if you are at a healthy weight. Waist circumference. This measures the distance around your waistline. This measurement also tells if you are at a healthy weight and may help predict your risk of certain diseases, such as type 2 diabetes and high blood pressure. Heart rate and blood pressure. Body temperature. Skin for abnormal spots. What immunizations do I need?  Vaccines are usually given at various ages, according to a schedule. Your health care provider will recommend vaccines for you based on your age, medical history, and lifestyle or  other factors, such as travel or where you work. What tests do I need? Screening Your health care provider may recommend screening tests for certain conditions. This may include: Lipid and cholesterol levels. Diabetes screening. This is done by checking your blood sugar (glucose) after you have not eaten for a while (fasting). Hepatitis B test. Hepatitis C test. HIV (human immunodeficiency virus) test. STI (sexually transmitted infection) testing, if you are at risk. Lung cancer screening. Prostate cancer screening. Colorectal cancer screening. Talk with your health care provider about your test results, treatment options, and if necessary, the need for more tests. Follow these instructions at home: Eating and drinking  Eat a diet that includes fresh fruits and vegetables, whole grains, lean protein, and low-fat dairy products. Take vitamin and mineral supplements as recommended by your health care provider. Do not drink alcohol if your health care provider tells you not to drink. If you drink alcohol: Limit how much you have to 0-2 drinks a day. Know how much alcohol is in your drink. In the [place] one drink equals one 12 oz bottle of beer (355 mL), one 5 oz glass of wine (148 mL), or one 1 oz glass of hard liquor (44 mL). Lifestyle Brush your teeth every morning and night with fluoride toothpaste. Floss one time each day. Exercise for at least 30 minutes 5 or more days each week. Do not use any products that contain nicotine or tobacco. These products include cigarettes, chewing tobacco, and vaping devices, such as e-cigarettes. If you need help quitting, ask your health care provider. Do not use drugs. If you are sexually active, practice safe sex. Use a condom or  other form of protection to prevent STIs. Take aspirin only as told by your health care provider. Make sure that you understand how much to take and what form to take. Work with your health care provider to find out whether  it is safe and beneficial for you to take aspirin daily. Find healthy ways to manage stress, such as: Meditation, yoga, or listening to music. Journaling. Talking to a trusted person. Spending time with friends and family. Minimize exposure to UV radiation to reduce your risk of skin cancer. Safety Always wear your seat belt while driving or riding in a vehicle. Do not drive: If you have been drinking alcohol. Do not ride with someone who has been drinking. When you are tired or distracted. While texting. If you have been using any mind-altering substances or drugs. Wear a helmet and other protective equipment during sports activities. If you have firearms in your house, make sure you follow all gun safety procedures. What's next? Go to your health care provider once a year for an annual wellness visit. Ask your health care provider how often you should have your eyes and teeth checked. Stay up to date on all vaccines. This information is not intended to replace advice given to you by your health care provider. Make sure you discuss any questions you have with your health care provider. Document Revised: 09/10/2020 Document Reviewed: 09/10/2020 Elsevier Patient Education  2024 [employer]  General Headache Without Cause A headache is pain or discomfort felt around the head or neck area. There are many causes and types of headaches. A few common types include: Tension headaches. Migraine headaches. Cluster headaches. Chronic daily headaches. Sometimes, the specific cause of a headache may not be found. Follow these instructions at home: Watch your condition for any changes. Let your health care provider know about them. Take these steps to help with your condition: Managing pain     Take over-the-counter and prescription medicines only as told by your health care provider. Treatment may include medicines for pain that are taken by mouth or applied to the skin. Lie down in a  dark, quiet room when you have a headache. Keep lights dim if bright lights bother you or make your headaches worse. If directed, put ice on your head and neck area: Put ice in a plastic bag. Place a towel between your skin and the bag. Leave the ice on for 20 minutes, 2-3 times per day. Remove the ice if your skin turns bright red. This is very important. If you cannot feel pain, heat, or cold, you have a greater risk of damage to the area. If directed, apply heat to the affected area. Use the heat source that your health care provider recommends, such as a moist heat pack or a heating pad. Place a towel between your skin and the heat source. Leave the heat on for 20-30 minutes. Remove the heat if your skin turns bright red. This is especially important if you are unable to feel pain, heat, or cold. You have a greater risk of getting burned. Eating and drinking Eat meals on a regular schedule. If you drink alcohol: Limit how much you have to: 0-1 drink a day for women who are not pregnant. 0-2 drinks a day for men. Know how much alcohol is in a drink. In the [place] one drink equals one 12 oz bottle of beer (355 mL), one 5 oz glass of wine (148 mL), or one 1 oz glass of hard liquor (  44 mL). Stop drinking caffeine, or decrease the amount of caffeine you drink. Drink enough fluid to keep your urine pale yellow. General instructions  Keep a headache journal to help find out what may trigger your headaches. For example, write down: What you eat and drink. How much sleep you get. Any change to your diet or medicines. Try massage or other relaxation techniques. Limit stress. Sit up straight, and do not tense your muscles. Do not use any products that contain nicotine or tobacco. These products include cigarettes, chewing tobacco, and vaping devices, such as e-cigarettes. If you need help quitting, ask your health care provider. Exercise regularly as told by your health care provider. Sleep  on a regular schedule. Get 7-9 hours of sleep each night, or the amount recommended by your health care provider. Keep all follow-up visits. This is important. Contact a health care provider if: Medicine does not help your symptoms. You have a headache that is different from your usual headache. You have nausea or you vomit. You have a fever. Get help right away if: Your headache: Becomes severe quickly. Gets worse after moderate to intense physical activity. You have any of these symptoms: Repeated vomiting. Pain or stiffness in your neck. Changes to your vision. Pain in an eye or ear. Problems with speech. Muscular weakness or loss of muscle control. Loss of balance or coordination. You feel faint or pass out. You have confusion. You have a seizure. These symptoms may represent a serious problem that is an emergency. Do not wait to see if the symptoms will go away. Get medical help right away. Call your local emergency services (911 in the [place] Do not drive yourself to the hospital. Summary A headache is pain or discomfort felt around the head or neck area. There are many causes and types of headaches. In some cases, the cause may not be found. Keep a headache journal to help find out what may trigger your headaches. Watch your condition for any changes. Let your health care provider know about them. Contact a health care provider if you have a headache that is different from the usual headache, or if your symptoms are not helped by medicine. Get help right away if your headache becomes severe, you vomit, you have a loss of vision, you lose your balance, or you have a seizure. This information is not intended to replace advice given to you by your health care provider. Make sure you discuss any questions you have with your health care provider. Document Revised: 08/13/2020 Document Reviewed: 08/13/2020 Elsevier Patient Education  2024 [employer].

## 2024-02-29 NOTE — Progress Notes (Unsigned)
 Subjective:  Patient ID: Scott [name], male    DOB: [birth year]  Age: 63 y.o. MRN: [id number]  CC:  Chief Complaint  Patient presents with   Annual Exam   Headache    Sx started 2 weeks. Comes and goes. Above the left eye. Dull. Patient is on a computer all day. He does wear reading glasses    HPI Scott [name] presents for Annual Exam And acute concern of headache as above  PCP, me Gastroenterology, Dr. Rollin Dermatology, Dr. Elnor, appointment May 21. Neuro/sleep, Dr. Buck, Duwaine Russell, OSA on CPAP. Now has travel CPAP.  Urology, Dr. Renda, history of prostate cancer with radical prostatectomy.  He is followed at my office for PSAs as over 10 years since disease when seen in 2021.  Erectile dysfunction Cialis  5 mg daily.  Denies any side effects, hearing or vision changes.  No typical headache flushing chest pain or dyspnea exertion with use of medicine. Taking daily. Effective.   Vitamin D  deficiency Treated with over-the-counter supplementation. Last vitamin D  Lab Results  Component Value Date   VD25OH 68.63 08/13/2022   History of genital HSV Valtrex  as needed for few flares per year.  No recent  Hyperlipidemia: Crestor  40 mg daily without new myalgia or side effects. Lab Results  Component Value Date   CHOL 204 (H) 08/25/2023   HDL 58.40 08/25/2023   LDLCALC 133 (H) 08/25/2023   TRIG 59.0 08/25/2023   CHOLHDL 3 08/25/2023   Lab Results  Component Value Date   ALT 23 08/25/2023   AST 25 08/25/2023   ALKPHOS 63 08/25/2023   BILITOT 0.6 08/25/2023   Left frontal headache Intermittent headache, left frontal, last 1/2 to 2 weeks.  Comes and goes.  Not worst headache of life, no speech issues, no weakness.  Does have some sinus congestion at times at night.  He is in front of a computer throughout the day.  Considering getting blue and walking lenses for his glasses.  Has not seen Optho recently, does plan to schedule visit.  Has used saline nasal  spray but no other prescription meds for headache.  Does have history of nosebleed, requiring cauterization last month with silver nitrate, no recurrence.  Lab Results  Component Value Date   HGBA1C 5.7 02/21/2023  \      02/29/2024    8:27 AM 08/25/2023    7:54 AM 02/21/2023    8:10 AM 08/13/2022    8:11 AM 02/11/2022    8:03 AM  Depression screen PHQ 2/9  Decreased Interest 0 0 0 0 0  Down, Depressed, Hopeless 0 0 0 0 0  PHQ - 2 Score 0 0 0 0 0  Altered sleeping 0 0 0 0 0  Tired, decreased energy 0 0 0 0 0  Change in appetite 0 0 0 0 0  Feeling bad or failure about yourself  0 0 0 0 0  Trouble concentrating 0 0 0 0 0  Moving slowly or fidgety/restless 0 0 0 0 0  Suicidal thoughts 0 0 0 0 0  PHQ-9 Score 0 0  0  0  0   Difficult doing work/chores Not difficult at all   Not difficult at all      Data saved with a previous flowsheet row definition    Health Maintenance  Topic Date Due   COVID-19 Vaccine (4 - 2025-26 season) 03/16/2024 (Originally 11/28/2023)   Influenza Vaccine  06/26/2024 (Originally 10/28/2023)   Pneumococcal Vaccine: 50+ Years (  1 of 2 - PCV) 02/28/2025 (Originally 08/27/1978)   DTaP/Tdap/Td (3 - Td or Tdap) 12/22/2026   Colonoscopy  08/29/2033   Hepatitis C Screening  Completed   HIV Screening  Completed   Zoster Vaccines- Shingrix  Completed   Hepatitis B Vaccines 19-59 Average Risk  Aged Out   HPV VACCINES  Aged Out   Meningococcal B Vaccine  Aged Out  Colonoscopy June 2021, then repeated recently by Dr. Rollin, repeat in 3 years. Prostate: History of prostate cancer as above, serial monitoring of PSA status post prostatectomy in 2011.   Lab Results  Component Value Date   PSA 0.01 (L) 02/21/2023   PSA 0.00 (L) 02/11/2022   PSA 0.00 Repeated and verified X2. (L) 08/04/2020      Immunization History  Administered Date(s) Administered   Influenza Inj Mdck Quad Pf 01/26/2022   Influenza,inj,Quad PF,6+ Mos 12/20/2018, 12/17/2020    Influenza-Unspecified 03/29/2010, 12/27/2013, 12/28/2015, 12/31/2016, 12/22/2017, 12/12/2019, 12/07/2022   Moderna Sars-Covid-2 Vaccination 06/08/2019, 07/06/2019   PFIZER(Purple Top)SARS-COV-2 Vaccination 03/11/2020   Td 10/03/2006   Tdap 12/21/2016   Zoster Recombinant(Shingrix) 02/04/2020, 08/04/2020  Declines flu COVID pneumonia vaccines.  No results found. Due for updated eye exam, he plans to schedule.  Dental: Every 6 months with dentist.  Alcohol: 2 to 3/week.  Tobacco: Rare cigar  Exercise: Walking multiple days per week, with cone change less but still on weekends.  Some home exercises.   History Patient Active Problem List   Diagnosis Date Noted   Arthritis 08/24/2023   Diverticular disease of colon 08/24/2023   Screening for malignant neoplasm of colon 08/24/2023   Mixed hyperlipidemia 08/24/2023   Infected epidermoid cyst 09/08/2020   OSA on CPAP 03/11/2017   Obesity 07/10/2015   Hyperlipidemia    Herpes simplex type II infection    Past Medical History:  Diagnosis Date   Arthritis    Asperger's syndrome    Never formally diagnosed - potential traits of   Cataract    Herpes simplex type II infection    Hyperlipidemia    Malignant neoplasm of prostate (HCC)    Multiple lipomas    Premature ejaculation 07/2004   Pulmonary embolism (HCC) 04/2009   Sleep apnea    Unspecified vitamin D  deficiency    Past Surgical History:  Procedure Laterality Date   CATARACT EXTRACTION, BILATERAL     EYE SURGERY N/A    [facility] 02/01/2020   Hip repalcement  2012   JOINT REPLACEMENT N/A    [facility] 02/01/2020   KNEE ARTHROSCOPY W/ MENISCAL REPAIR Left 12/2014   PROSTATE SURGERY N/A    [facility] 02/01/2020   replacment     Lt hip Feb 2022   SHOULDER OPEN ROTATOR CUFF REPAIR     XI ROBOTIC ASSISTED SIMPLE PROSTATECTOMY  05/21/2009   No Known Allergies Prior to Admission medications   Medication Sig Start Date End Date Taking? Authorizing Provider  Ascorbic Acid  (VITAMIN C) 1000 MG tablet Take 1,000 mg by mouth. Takes occassionally    [provider]  rosuvastatin  (CRESTOR ) 40 MG tablet Take 1 tablet (40 mg total) by mouth daily. 08/25/23   Levora Reyes SAUNDERS, MD  tadalafil  (CIALIS ) 5 MG tablet Take 1 tablet (5 mg total) by mouth daily as needed. 08/25/23   Levora Reyes SAUNDERS, MD  valACYclovir  (VALTREX ) 500 MG tablet Take 1 tablet (500 mg total) by mouth 2 (two) times daily. For 3 days at time of flair. Patient taking differently: Take 500 mg by mouth  2 (two) times daily as needed. For 3 days at time of flair. 08/25/23   Levora Reyes SAUNDERS, MD  VITAMIN D , CHOLECALCIFEROL , PO Take by mouth.    [provider]   Social History   Socioeconomic History   Marital status: Married    Spouse name: Vaunda   Number of children: 2   Years of education: 18   Highest education level: Master's degree (e.g., MA, MS, MEng, MEd, MSW, MBA)  Occupational History   Not on file  Tobacco Use   Smoking status: Light Smoker    Types: Cigars   Smokeless tobacco: Never   Tobacco comments:    Smokes cigar when playing golf quit smoking cigarettes in 2004   Vaping Use   Vaping status: Never Used  Substance and Sexual Activity   Alcohol use: Yes    Alcohol/week: 4.0 standard drinks of alcohol    Types: 2 Cans of beer, 2 Shots of liquor per week   Drug use: No   Sexual activity: Yes  Other Topics Concern   Not on file  Social History Narrative   Not on file   Social Drivers of Health   Financial Resource Strain: Low Risk  (08/21/2023)   Overall Financial Resource Strain (CARDIA)    Difficulty of Paying Living Expenses: Not hard at all  Food Insecurity: No Food Insecurity (08/21/2023)   Hunger Vital Sign    Worried About Running Out of Food in the Last Year: Never true    Ran Out of Food in the Last Year: Never true  Transportation Needs: No Transportation Needs (08/21/2023)   PRAPARE - Administrator, Civil Service (Medical): No    Lack  of Transportation (Non-Medical): No  Physical Activity: Sufficiently Active (08/21/2023)   Exercise Vital Sign    Days of Exercise per Week: 5 days    Minutes of Exercise per Session: 60 min  Stress: No Stress Concern Present (08/21/2023)   [employer] of Occupational Health - Occupational Stress Questionnaire    Feeling of Stress : Not at all  Social Connections: Moderately Isolated (08/21/2023)   Social Connection and Isolation Panel    Frequency of Communication with Friends and Family: Once a week    Frequency of Social Gatherings with Friends and Family: More than three times a week    Attends Religious Services: Never    Database Administrator or Organizations: No    Attends Engineer, Structural: Not on file    Marital Status: Married  Catering Manager Violence: Not on file    Review of Systems 13 point review of systems per patient health survey noted.  Negative other than as indicated above or in HPI.    Objective:   Vitals:   02/29/24 0819  BP: 124/66  Pulse: 71  Resp: 16  Temp: 97.7 F (36.5 C)  TempSrc: Temporal  SpO2: 96%  Weight: 272 lb 9.6 oz (123.7 kg)  Height: 6' 2.25 (1.886 m)     Physical Exam Vitals reviewed.  Constitutional:      Appearance: He is well-developed.  HENT:     Head: Normocephalic and atraumatic.     Right Ear: External ear normal.     Left Ear: External ear normal.  Eyes:     Conjunctiva/sclera: Conjunctivae normal.     Pupils: Pupils are equal, round, and reactive to light.  Neck:     Thyroid : No thyromegaly.  Cardiovascular:     Rate and Rhythm: Normal  rate and regular rhythm.     Heart sounds: Normal heart sounds.  Pulmonary:     Effort: Pulmonary effort is normal. No respiratory distress.     Breath sounds: Normal breath sounds. No wheezing.  Abdominal:     General: There is no distension.     Palpations: Abdomen is soft.     Tenderness: There is no abdominal tenderness.  Musculoskeletal:        General:  No tenderness. Normal range of motion.     Cervical back: Normal range of motion and neck supple.  Lymphadenopathy:     Cervical: No cervical adenopathy.  Skin:    General: Skin is warm and dry.  Neurological:     Mental Status: He is alert and oriented to person, place, and time.     Deep Tendon Reflexes: Reflexes are normal and symmetric.  Psychiatric:        Behavior: Behavior normal.        Assessment & Plan:  Scott [name] is a 63 y.o. male . Annual physical exam  - -anticipatory guidance as below in AVS, screening labs above. Health maintenance items as above in HPI discussed/recommended as applicable.   Frontal headache  - New concern.  Question sinus congestion as contributor, sinus headache possible.  Discussed trial of Flonase nasal spray, maintain hydration, RTC precautions.  Handout given with headache.    Erectile dysfunction, unspecified erectile dysfunction type  - Tolerating Cialis , continue same.  History of herpes genitalis  - Treated with Valtrex  as above, no changes.  Stable.  Hyperlipidemia, unspecified hyperlipidemia type - Plan: Comprehensive metabolic panel with GFR, Lipid panel  - Crestor , check labs and adjust plan accordingly.  Screening for prostate cancer - Plan: PSA  Screening for diabetes mellitus - Plan: Hemoglobin A1c  Screening, anemia, deficiency, iron - Plan: CBC  Vitamin D  deficiency - Plan: Vitamin D  (25 hydroxy) Continue supplementation, check labs and adjust plan accordingly.  No orders of the defined types were placed in this encounter.  Patient Instructions  Try using Flonase nasal spray 1 spray in each nostril in the afternoon to see if some nasal congestion might be contributed to headache.  Make sure you are drinking plenty of fluids, see other information regarding headache below.  If not improving in the next week or 2, or any worsening symptoms sooner, please be seen.  Follow-up with new eye specialist.  I will check  some screening labs today and if any concerns we will let you know.  Take care.   Preventive Care 23-50 Years Old, Male Preventive care refers to lifestyle choices and visits with your health care provider that can promote health and wellness. Preventive care visits are also called wellness exams. What can I expect for my preventive care visit? Counseling During your preventive care visit, your health care provider may ask about your: Medical history, including: Past medical problems. Family medical history. Current health, including: Emotional well-being. Home life and relationship well-being. Sexual activity. Lifestyle, including: Alcohol, nicotine or tobacco, and drug use. Access to firearms. Diet, exercise, and sleep habits. Safety issues such as seatbelt and bike helmet use. Sunscreen use. Work and work astronomer. Physical exam Your health care provider will check your: Height and weight. These may be used to calculate your BMI (body mass index). BMI is a measurement that tells if you are at a healthy weight. Waist circumference. This measures the distance around your waistline. This measurement also tells if you are at a healthy  weight and may help predict your risk of certain diseases, such as type 2 diabetes and high blood pressure. Heart rate and blood pressure. Body temperature. Skin for abnormal spots. What immunizations do I need?  Vaccines are usually given at various ages, according to a schedule. Your health care provider will recommend vaccines for you based on your age, medical history, and lifestyle or other factors, such as travel or where you work. What tests do I need? Screening Your health care provider may recommend screening tests for certain conditions. This may include: Lipid and cholesterol levels. Diabetes screening. This is done by checking your blood sugar (glucose) after you have not eaten for a while (fasting). Hepatitis B test. Hepatitis C  test. HIV (human immunodeficiency virus) test. STI (sexually transmitted infection) testing, if you are at risk. Lung cancer screening. Prostate cancer screening. Colorectal cancer screening. Talk with your health care provider about your test results, treatment options, and if necessary, the need for more tests. Follow these instructions at home: Eating and drinking  Eat a diet that includes fresh fruits and vegetables, whole grains, lean protein, and low-fat dairy products. Take vitamin and mineral supplements as recommended by your health care provider. Do not drink alcohol if your health care provider tells you not to drink. If you drink alcohol: Limit how much you have to 0-2 drinks a day. Know how much alcohol is in your drink. In the [place] one drink equals one 12 oz bottle of beer (355 mL), one 5 oz glass of wine (148 mL), or one 1 oz glass of hard liquor (44 mL). Lifestyle Brush your teeth every morning and night with fluoride toothpaste. Floss one time each day. Exercise for at least 30 minutes 5 or more days each week. Do not use any products that contain nicotine or tobacco. These products include cigarettes, chewing tobacco, and vaping devices, such as e-cigarettes. If you need help quitting, ask your health care provider. Do not use drugs. If you are sexually active, practice safe sex. Use a condom or other form of protection to prevent STIs. Take aspirin only as told by your health care provider. Make sure that you understand how much to take and what form to take. Work with your health care provider to find out whether it is safe and beneficial for you to take aspirin daily. Find healthy ways to manage stress, such as: Meditation, yoga, or listening to music. Journaling. Talking to a trusted person. Spending time with friends and family. Minimize exposure to UV radiation to reduce your risk of skin cancer. Safety Always wear your seat belt while driving or riding in a  vehicle. Do not drive: If you have been drinking alcohol. Do not ride with someone who has been drinking. When you are tired or distracted. While texting. If you have been using any mind-altering substances or drugs. Wear a helmet and other protective equipment during sports activities. If you have firearms in your house, make sure you follow all gun safety procedures. What's next? Go to your health care provider once a year for an annual wellness visit. Ask your health care provider how often you should have your eyes and teeth checked. Stay up to date on all vaccines. This information is not intended to replace advice given to you by your health care provider. Make sure you discuss any questions you have with your health care provider. Document Revised: 09/10/2020 Document Reviewed: 09/10/2020 Elsevier Patient Education  2024 Elsevier Inc.  General Headache Without  Cause A headache is pain or discomfort felt around the head or neck area. There are many causes and types of headaches. A few common types include: Tension headaches. Migraine headaches. Cluster headaches. Chronic daily headaches. Sometimes, the specific cause of a headache may not be found. Follow these instructions at home: Watch your condition for any changes. Let your health care provider know about them. Take these steps to help with your condition: Managing pain     Take over-the-counter and prescription medicines only as told by your health care provider. Treatment may include medicines for pain that are taken by mouth or applied to the skin. Lie down in a dark, quiet room when you have a headache. Keep lights dim if bright lights bother you or make your headaches worse. If directed, put ice on your head and neck area: Put ice in a plastic bag. Place a towel between your skin and the bag. Leave the ice on for 20 minutes, 2-3 times per day. Remove the ice if your skin turns bright red. This is very important.  If you cannot feel pain, heat, or cold, you have a greater risk of damage to the area. If directed, apply heat to the affected area. Use the heat source that your health care provider recommends, such as a moist heat pack or a heating pad. Place a towel between your skin and the heat source. Leave the heat on for 20-30 minutes. Remove the heat if your skin turns bright red. This is especially important if you are unable to feel pain, heat, or cold. You have a greater risk of getting burned. Eating and drinking Eat meals on a regular schedule. If you drink alcohol: Limit how much you have to: 0-1 drink a day for women who are not pregnant. 0-2 drinks a day for men. Know how much alcohol is in a drink. In the [place] one drink equals one 12 oz bottle of beer (355 mL), one 5 oz glass of wine (148 mL), or one 1 oz glass of hard liquor (44 mL). Stop drinking caffeine, or decrease the amount of caffeine you drink. Drink enough fluid to keep your urine pale yellow. General instructions  Keep a headache journal to help find out what may trigger your headaches. For example, write down: What you eat and drink. How much sleep you get. Any change to your diet or medicines. Try massage or other relaxation techniques. Limit stress. Sit up straight, and do not tense your muscles. Do not use any products that contain nicotine or tobacco. These products include cigarettes, chewing tobacco, and vaping devices, such as e-cigarettes. If you need help quitting, ask your health care provider. Exercise regularly as told by your health care provider. Sleep on a regular schedule. Get 7-9 hours of sleep each night, or the amount recommended by your health care provider. Keep all follow-up visits. This is important. Contact a health care provider if: Medicine does not help your symptoms. You have a headache that is different from your usual headache. You have nausea or you vomit. You have a fever. Get help right  away if: Your headache: Becomes severe quickly. Gets worse after moderate to intense physical activity. You have any of these symptoms: Repeated vomiting. Pain or stiffness in your neck. Changes to your vision. Pain in an eye or ear. Problems with speech. Muscular weakness or loss of muscle control. Loss of balance or coordination. You feel faint or pass out. You have confusion. You have a seizure.  These symptoms may represent a serious problem that is an emergency. Do not wait to see if the symptoms will go away. Get medical help right away. Call your local emergency services (911 in the [place] Do not drive yourself to the hospital. Summary A headache is pain or discomfort felt around the head or neck area. There are many causes and types of headaches. In some cases, the cause may not be found. Keep a headache journal to help find out what may trigger your headaches. Watch your condition for any changes. Let your health care provider know about them. Contact a health care provider if you have a headache that is different from the usual headache, or if your symptoms are not helped by medicine. Get help right away if your headache becomes severe, you vomit, you have a loss of vision, you lose your balance, or you have a seizure. This information is not intended to replace advice given to you by your health care provider. Make sure you discuss any questions you have with your health care provider. Document Revised: 08/13/2020 Document Reviewed: 08/13/2020 Elsevier Patient Education  2024 Elsevier Inc.    Signed,   Reyes Pines, MD [facility], [facility] 02/29/24 9:05 AM

## 2024-03-04 ENCOUNTER — Ambulatory Visit: Payer: Self-pay | Admitting: Family Medicine

## 2024-03-09 ENCOUNTER — Encounter: Payer: Self-pay | Admitting: Family Medicine

## 2024-03-09 ENCOUNTER — Ambulatory Visit: Admitting: Family Medicine

## 2024-03-09 VITALS — BP 124/68 | HR 72 | Temp 98.5°F | Resp 16 | Ht 74.25 in | Wt 270.0 lb

## 2024-03-09 DIAGNOSIS — R519 Headache, unspecified: Secondary | ICD-10-CM | POA: Diagnosis not present

## 2024-03-09 NOTE — Progress Notes (Unsigned)
 Subjective:  Patient ID: Scott [name], male    DOB: 08-Mar-1961  Age: 63 y.o. MRN: [id number]  CC:  Chief Complaint  Patient presents with   Headache    Same HA for 2-3 weeks. Never fully goes away. Nothing makes it worse. Patient stated throughout the day it gets worse. Flonase did not work since he last saw you    HPI Scott [name] presents for   New onset headache Left frontal headache discussed at his physical December 3.  Had been present for approximately 2 weeks.  Intermittent, not worst headache of his life, no associated neurologic symptoms speech issues or weakness.  Did report some intermittent sinus congestion at night and was in front of a computer during the day, with consideration of blue blocking glasses.  Had planned on following up with Optho.  Had tried saline nasal spray but no other prescription meds for headache at that time. Recommended use of Flonase nasal spray initially.  Since last visit, headache has persisted. Has tried 2 sprays/nostril per day - no relief. Minimal sinus congestion, not as bad as last winter, and did not have HA last winter.  HA 1-2/10 in am when he wakes up. HA progresses during day to 7-8/10 by end of day.  Tylenol  in evening helps some, but not resolved.  No head injury.  No vision changes, no jaw pain, no weakness. No tinnitus. Ears feel congested at times. Not waking him up at night.  Constant when awake. L frontal primarily - rely to midline frontal.  No eye/cheek pressure.  No typical history of HA.   Nosebleeds in October, with cauterizing 10/31, no recurrence.  Appt this Monday with Megan Millikan with neuro for OSA. Has been consistent with CPAP use.      History Patient Active Problem List   Diagnosis Date Noted   Arthritis 08/24/2023   Diverticular disease of colon 08/24/2023   Screening for malignant neoplasm of colon 08/24/2023   Mixed hyperlipidemia 08/24/2023   Infected epidermoid cyst 09/08/2020    OSA on CPAP 03/11/2017   Obesity 07/10/2015   Hyperlipidemia    Herpes simplex type II infection    Past Medical History:  Diagnosis Date   Arthritis    Asperger's syndrome    Never formally diagnosed - potential traits of   Cataract    Herpes simplex type II infection    Hyperlipidemia    Malignant neoplasm of prostate (HCC)    Multiple lipomas    Premature ejaculation 07/2004   Pulmonary embolism (HCC) 04/2009   Sleep apnea    Unspecified vitamin D  deficiency    Past Surgical History:  Procedure Laterality Date   CATARACT EXTRACTION, BILATERAL     EYE SURGERY N/A    [facility] 02/01/2020   Hip repalcement  2012   JOINT REPLACEMENT N/A    [facility] 02/01/2020   KNEE ARTHROSCOPY W/ MENISCAL REPAIR Left 12/2014   PROSTATE SURGERY N/A    [facility] 02/01/2020   replacment     Lt hip Feb 2022   SHOULDER OPEN ROTATOR CUFF REPAIR     XI ROBOTIC ASSISTED SIMPLE PROSTATECTOMY  05/21/2009   Allergies[1] Prior to Admission medications  Medication Sig Start Date End Date Taking? Authorizing Provider  Ascorbic Acid (VITAMIN C) 1000 MG tablet Take 1,000 mg by mouth. Takes occassionally   Yes [provider]  fluticasone (FLONASE) 50 MCG/ACT nasal spray Place 2 sprays into both nostrils daily.   Yes [provider]  rosuvastatin  (  CRESTOR ) 40 MG tablet Take 1 tablet (40 mg total) by mouth daily. 08/25/23  Yes Levora Reyes SAUNDERS, MD  tadalafil  (CIALIS ) 5 MG tablet Take 1 tablet (5 mg total) by mouth daily as needed. 08/25/23  Yes Levora Reyes SAUNDERS, MD  valACYclovir  (VALTREX ) 500 MG tablet Take 1 tablet (500 mg total) by mouth 2 (two) times daily. For 3 days at time of flair. Patient taking differently: Take 500 mg by mouth 2 (two) times daily as needed. For 3 days at time of flair. 08/25/23  Yes Levora Reyes SAUNDERS, MD  VITAMIN D , CHOLECALCIFEROL , PO Take by mouth.   Yes [provider]   Social History   Socioeconomic History   Marital status: Married    Spouse  name: Vaunda   Number of children: 2   Years of education: 18   Highest education level: Master's degree (e.g., MA, MS, MEng, MEd, MSW, MBA)  Occupational History   Not on file  Tobacco Use   Smoking status: Light Smoker    Types: Cigars   Smokeless tobacco: Never   Tobacco comments:    Smokes cigar when playing golf quit smoking cigarettes in 2004   Vaping Use   Vaping status: Never Used  Substance and Sexual Activity   Alcohol use: Yes    Alcohol/week: 4.0 standard drinks of alcohol    Types: 2 Cans of beer, 2 Shots of liquor per week   Drug use: No   Sexual activity: Yes  Other Topics Concern   Not on file  Social History Narrative   Not on file   Social Drivers of Health   Tobacco Use: High Risk (03/09/2024)   Patient History    Smoking Tobacco Use: Light Smoker    Smokeless Tobacco Use: Never    Passive Exposure: Not on file  Financial Resource Strain: Low Risk (03/08/2024)   Overall Financial Resource Strain (CARDIA)    Difficulty of Paying Living Expenses: Not hard at all  Food Insecurity: No Food Insecurity (03/08/2024)   Epic    Worried About Radiation Protection Practitioner of Food in the Last Year: Never true    Ran Out of Food in the Last Year: Never true  Transportation Needs: No Transportation Needs (03/08/2024)   Epic    Lack of Transportation (Medical): No    Lack of Transportation (Non-Medical): No  Physical Activity: Insufficiently Active (03/08/2024)   Exercise Vital Sign    Days of Exercise per Week: 4 days    Minutes of Exercise per Session: 30 min  Stress: No Stress Concern Present (03/08/2024)   [employer] of Occupational Health - Occupational Stress Questionnaire    Feeling of Stress: Not at all  Social Connections: Moderately Isolated (03/08/2024)   Social Connection and Isolation Panel    Frequency of Communication with Friends and Family: Patient declined    Frequency of Social Gatherings with Friends and Family: More than three times a week     Attends Religious Services: Patient declined    Active Member of Clubs or Organizations: No    Attends Banker Meetings: Not on file    Marital Status: Married  Intimate Partner Violence: Not on file  Depression (PHQ2-9): Low Risk (02/29/2024)   Depression (PHQ2-9)    PHQ-2 Score: 0  Alcohol Screen: Low Risk (03/08/2024)   Alcohol Screen    Last Alcohol Screening Score (AUDIT): 3  Housing: Unknown (03/08/2024)   Epic    Unable to Pay for Housing in the Last Year: No  Number of Times Moved in the Last Year: Not on file    Homeless in the Last Year: No  Utilities: Not on file  Health Literacy: Not on file    Review of Systems Per HPI  Objective:   Vitals:   03/09/24 1108  BP: 124/68  Pulse: 72  Resp: 16  Temp: 98.5 F (36.9 C)  TempSrc: Temporal  SpO2: 96%  Weight: 270 lb (122.5 kg)  Height: 6' 2.25 (1.886 m)     Physical Exam Vitals reviewed.  Constitutional:      Appearance: He is well-developed.  HENT:     Head: Normocephalic and atraumatic.     Nose:     Comments: Very slight discomfort with percussion over left frontal sinus but persistent left frontal pain, with minimal change on percussion.  Maxillary sinuses nontender. Neck:     Vascular: No carotid bruit or JVD.  Cardiovascular:     Rate and Rhythm: Normal rate and regular rhythm.     Heart sounds: Normal heart sounds. No murmur heard. Pulmonary:     Effort: Pulmonary effort is normal.     Breath sounds: Normal breath sounds. No rales.  Musculoskeletal:     Right lower leg: No edema.     Left lower leg: No edema.  Skin:    General: Skin is warm and dry.  Neurological:     General: No focal deficit present.     Mental Status: He is alert and oriented to person, place, and time.     GCS: GCS eye subscore is 4. GCS verbal subscore is 5. GCS motor subscore is 6.     Cranial Nerves: No cranial nerve deficit, dysarthria or facial asymmetry.     Motor: No weakness, abnormal muscle tone  or pronator drift.     Coordination: Romberg sign negative. Coordination normal.     Gait: Gait normal.  Psychiatric:        Mood and Affect: Mood normal.        Assessment & Plan:  Scott [name] is a 63 y.o. male . Frontal headache - Plan: MR Brain W Wo Contrast  New onset headache - Plan: MR Brain W Wo Contrast  Daily headache - Plan: MR Brain W Wo Contrast Frontal headache, without significant change with allergy treatment, steroid nasal spray as above.  Continues to have a nonfocal neuroexam, but with new onset daily headaches, persistent headaches, I do think imaging would be reasonable at this time.  MRI ordered, some time until MRI is available.  He was able to follow-up with neuro and they have scheduled imaging within a few days.  Will watch for results and then decide on next step.  Symptomatic care discussed with RTC precautions given. No orders of the defined types were placed in this encounter.  Patient Instructions  Sorry to hear you still have the headache.  I think imaging would be reasonable at this time to rule out another cause of those persistent headaches.  I have ordered an MRI.  That should also show some of your sinuses but your symptoms at this time do not specifically indicate a sinus infection.  I will send a copy of the note from today to your sleep specialist, neurology for upcoming visit as they may also have some input into your headaches and further workup or treatment.  Tylenol  or Motrin temporarily are fine for now.  If any new or worsening symptoms be seen but I do not expect that to  occur.  Please let me know if there are questions and hang in there.    Signed,   Reyes Pines, MD [facility], [facility] 03/09/2024 12:08 PM      [1] No Known Allergies

## 2024-03-09 NOTE — Patient Instructions (Signed)
 Sorry to hear you still have the headache.  I think imaging would be reasonable at this time to rule out another cause of those persistent headaches.  I have ordered an MRI.  That should also show some of your sinuses but your symptoms at this time do not specifically indicate a sinus infection.  I will send a copy of the note from today to your sleep specialist, neurology for upcoming visit as they may also have some input into your headaches and further workup or treatment.  Tylenol  or Motrin temporarily are fine for now.  If any new or worsening symptoms be seen but I do not expect that to occur.  Please let me know if there are questions and hang in there.

## 2024-03-10 DIAGNOSIS — G4733 Obstructive sleep apnea (adult) (pediatric): Secondary | ICD-10-CM | POA: Diagnosis not present

## 2024-03-11 NOTE — Progress Notes (Unsigned)
 PATIENT: Scott [name] DOB: March 22, 1961  REASON FOR VISIT: follow up HISTORY FROM: patient   No chief complaint on file.    HISTORY OF PRESENT ILLNESS: Today 03/11/2024:  Scott [name] is a 62 y.o. male with a history of ***. Returns today for follow-up.      02/17/23: Scott [name] is a 63 y.o. male with a history of OSA on CPAP. Returns today for follow-up. He reports that CPAP is working well. He does travel a lot and may be interested in trying inspire device. His DL is below.        REVIEW OF SYSTEMS: Out of a complete 14 system review of symptoms, the patient complains only of the following symptoms, and all other reviewed systems are negative.   ESS 1  ALLERGIES: No Known Allergies  HOME MEDICATIONS: Outpatient Medications Prior to Visit  Medication Sig Dispense Refill   Ascorbic Acid (VITAMIN C) 1000 MG tablet Take 1,000 mg by mouth. Takes occassionally     fluticasone (FLONASE) 50 MCG/ACT nasal spray Place 2 sprays into both nostrils daily.     rosuvastatin  (CRESTOR ) 40 MG tablet Take 1 tablet (40 mg total) by mouth daily. 90 tablet 3   tadalafil  (CIALIS ) 5 MG tablet Take 1 tablet (5 mg total) by mouth daily as needed. 30 tablet 11   valACYclovir  (VALTREX ) 500 MG tablet Take 1 tablet (500 mg total) by mouth 2 (two) times daily. For 3 days at time of flair. (Patient taking differently: Take 500 mg by mouth 2 (two) times daily as needed. For 3 days at time of flair.) 24 tablet 3   VITAMIN D , CHOLECALCIFEROL , PO Take by mouth.     No facility-administered medications prior to visit.    PAST MEDICAL HISTORY: Past Medical History:  Diagnosis Date   Arthritis    Asperger's syndrome    Never formally diagnosed - potential traits of   Cataract    Herpes simplex type II infection    Hyperlipidemia    Malignant neoplasm of prostate (HCC)    Multiple lipomas    Premature ejaculation 07/2004   Pulmonary embolism (HCC) 04/2009   Sleep  apnea    Unspecified vitamin D  deficiency     PAST SURGICAL HISTORY: Past Surgical History:  Procedure Laterality Date   CATARACT EXTRACTION, BILATERAL     EYE SURGERY N/A    [facility] 02/01/2020   Hip repalcement  2012   JOINT REPLACEMENT N/A    [facility] 02/01/2020   KNEE ARTHROSCOPY W/ MENISCAL REPAIR Left 12/2014   PROSTATE SURGERY N/A    [facility] 02/01/2020   replacment     Lt hip Feb 2022   SHOULDER OPEN ROTATOR CUFF REPAIR     XI ROBOTIC ASSISTED SIMPLE PROSTATECTOMY  05/21/2009    FAMILY HISTORY: Family History  Problem Relation Age of Onset   Diabetes Mother    Hypertension Mother    Rheum arthritis Mother    Arthritis Mother    Cancer Mother    Obesity Mother    Cancer Father    Heart failure Father        heart trouble   Heart disease Other    Hyperlipidemia Other    Diabetes Other    Cancer Sister    Sleep apnea Neg Hx     SOCIAL HISTORY: Social History   Socioeconomic History   Marital status: Married    Spouse name: Valla   Number of children: 2   Years of  education: 18   Highest education level: Master's degree (e.g., MA, MS, MEng, MEd, MSW, MBA)  Occupational History   Not on file  Tobacco Use   Smoking status: Light Smoker    Types: Cigars   Smokeless tobacco: Never   Tobacco comments:    Smokes cigar when playing golf quit smoking cigarettes in 2004   Vaping Use   Vaping status: Never Used  Substance and Sexual Activity   Alcohol use: Yes    Alcohol/week: 4.0 standard drinks of alcohol    Types: 2 Cans of beer, 2 Shots of liquor per week   Drug use: No   Sexual activity: Yes  Other Topics Concern   Not on file  Social History Narrative   Not on file   Social Drivers of Health   Tobacco Use: High Risk (03/09/2024)   Patient History    Smoking Tobacco Use: Light Smoker    Smokeless Tobacco Use: Never    Passive Exposure: Not on file  Financial Resource Strain: Low Risk (03/08/2024)   Overall Financial Resource Strain  (CARDIA)    Difficulty of Paying Living Expenses: Not hard at all  Food Insecurity: No Food Insecurity (03/08/2024)   Epic    Worried About Radiation Protection Practitioner of Food in the Last Year: Never true    Ran Out of Food in the Last Year: Never true  Transportation Needs: No Transportation Needs (03/08/2024)   Epic    Lack of Transportation (Medical): No    Lack of Transportation (Non-Medical): No  Physical Activity: Insufficiently Active (03/08/2024)   Exercise Vital Sign    Days of Exercise per Week: 4 days    Minutes of Exercise per Session: 30 min  Stress: No Stress Concern Present (03/08/2024)   [employer] of Occupational Health - Occupational Stress Questionnaire    Feeling of Stress: Not at all  Social Connections: Moderately Isolated (03/08/2024)   Social Connection and Isolation Panel    Frequency of Communication with Friends and Family: Patient declined    Frequency of Social Gatherings with Friends and Family: More than three times a week    Attends Religious Services: Patient declined    Active Member of Clubs or Organizations: No    Attends Engineer, Structural: Not on file    Marital Status: Married  Catering Manager Violence: Not on file  Depression (PHQ2-9): Low Risk (02/29/2024)   Depression (PHQ2-9)    PHQ-2 Score: 0  Alcohol Screen: Low Risk (03/08/2024)   Alcohol Screen    Last Alcohol Screening Score (AUDIT): 3  Housing: Unknown (03/08/2024)   Epic    Unable to Pay for Housing in the Last Year: No    Number of Times Moved in the Last Year: Not on file    Homeless in the Last Year: No  Utilities: Not on file  Health Literacy: Not on file      PHYSICAL EXAM  There were no vitals filed for this visit.  There is no height or weight on file to calculate BMI.  Generalized: Well developed, in no acute distress  Chest: Lungs clear to auscultation bilaterally  Neurological examination  Mentation: Alert oriented to time, place, history taking.  Follows all commands speech and language fluent Cranial nerve II-XII: facial symmetry noted   DIAGNOSTIC DATA (LABS, IMAGING, TESTING) - I reviewed patient records, labs, notes, testing and imaging myself where available.  Lab Results  Component Value Date   WBC 5.3 02/29/2024   HGB 14.7 02/29/2024  HCT 43.5 02/29/2024   MCV 95.3 02/29/2024   PLT 147.0 (L) 02/29/2024      Component Value Date/Time   NA 140 02/29/2024 0916   NA 140 04/25/2020 1156   K 4.6 02/29/2024 0916   CL 103 02/29/2024 0916   CO2 28 02/29/2024 0916   GLUCOSE 96 02/29/2024 0916   BUN 14 02/29/2024 0916   BUN 9 04/25/2020 1156   CREATININE 0.82 02/29/2024 0916   CREATININE 0.86 07/10/2015 1056   CALCIUM  9.6 02/29/2024 0916   PROT 7.1 02/29/2024 0916   PROT 7.0 04/25/2020 1156   ALBUMIN 4.7 02/29/2024 0916   ALBUMIN 4.6 04/25/2020 1156   AST 22 02/29/2024 0916   ALT 20 02/29/2024 0916   ALKPHOS 67 02/29/2024 0916   BILITOT 0.8 02/29/2024 0916   BILITOT 0.6 04/25/2020 1156   GFRNONAA 89 04/25/2020 1156   GFRNONAA >89 07/10/2015 1056   GFRAA 103 04/25/2020 1156   GFRAA >89 07/10/2015 1056   Lab Results  Component Value Date   CHOL 195 02/29/2024   HDL 48.30 02/29/2024   LDLCALC 131 (H) 02/29/2024   TRIG 82.0 02/29/2024   CHOLHDL 4 02/29/2024   Lab Results  Component Value Date   HGBA1C 5.5 02/29/2024   No results found for: VITAMINB12 Lab Results  Component Value Date   TSH 1.80 02/21/2023      ASSESSMENT AND PLAN 63 y.o. year old male  has a past medical history of Arthritis, Asperger's syndrome, Cataract, Herpes simplex type II infection, Hyperlipidemia, Malignant neoplasm of prostate (HCC), Multiple lipomas, Premature ejaculation (07/2004), Pulmonary embolism (HCC) (04/2009), Sleep apnea, and Unspecified vitamin D  deficiency. here with:  OSA on CPAP  - CPAP compliance excellent - Good treatment of AHI  - Encourage patient to use CPAP nightly and > 4 hours each night -  Discussed inspire but for now we will hold a travel machine to see if he likes that better. If not, we can consider referral for inspire.  - F/U in 1 year or sooner if needed    Duwaine Russell, MSN, NP-C 03/11/2024, 5:00 PM [facility] [address], [address] [place], [place] [address] [phone]

## 2024-03-12 ENCOUNTER — Ambulatory Visit: Payer: BC Managed Care – PPO | Admitting: Adult Health

## 2024-03-12 ENCOUNTER — Encounter: Payer: Self-pay | Admitting: Adult Health

## 2024-03-12 ENCOUNTER — Encounter: Payer: Self-pay | Admitting: Family Medicine

## 2024-03-12 VITALS — BP 131/79 | HR 71 | Ht 75.6 in | Wt 273.8 lb

## 2024-03-12 DIAGNOSIS — G4452 New daily persistent headache (NDPH): Secondary | ICD-10-CM

## 2024-03-12 DIAGNOSIS — G4733 Obstructive sleep apnea (adult) (pediatric): Secondary | ICD-10-CM

## 2024-03-12 NOTE — Telephone Encounter (Signed)
 FYI from patient with MRI

## 2024-03-13 ENCOUNTER — Other Ambulatory Visit

## 2024-03-13 DIAGNOSIS — G4452 New daily persistent headache (NDPH): Secondary | ICD-10-CM | POA: Diagnosis not present

## 2024-03-13 MED ORDER — GADOBENATE DIMEGLUMINE 529 MG/ML IV SOLN
20.0000 mL | Freq: Once | INTRAVENOUS | Status: AC | PRN
  Administered 2024-03-13: 15:00:00 20 mL via INTRAVENOUS

## 2024-03-14 ENCOUNTER — Ambulatory Visit: Payer: Self-pay | Admitting: Adult Health

## 2024-03-14 NOTE — Telephone Encounter (Signed)
 Patient would like more description of the findings in the MRI results.

## 2024-03-16 NOTE — Addendum Note (Signed)
 Addended by: Hildreth Robart R on: 03/16/2024 02:23 PM   Modules accepted: Orders

## 2024-03-19 NOTE — Addendum Note (Signed)
 Addended by: Karia Ehresman R on: 03/19/2024 04:07 PM   Modules accepted: Orders

## 2024-03-19 NOTE — Telephone Encounter (Signed)
 Late entry- I called the patient 12/18 at 430PM.   Reviewed MRI with him- he had some specific questions about some of the findings listed- which I advised I would discuss with Dr. Rosemarie who read the MRI.   He reports for the last two days he really hasn't had a headache. We mutually agreed we would just monitor symptoms. If headaches returned we would consider treatment.

## 2024-03-20 NOTE — Telephone Encounter (Signed)
 Can we see if anything available sooner or cancellation list available? Thanks.

## 2024-03-20 NOTE — Telephone Encounter (Signed)
 Patient sent you an update

## 2024-03-26 NOTE — Telephone Encounter (Signed)
 Please advise if patient should wait for the specialist to order labs? He is not able to get in until February.

## 2024-03-27 NOTE — Addendum Note (Signed)
 Addended by: Rolfe Hartsell R on: 03/27/2024 12:54 PM   Modules accepted: Orders

## 2024-03-28 ENCOUNTER — Ambulatory Visit: Payer: Self-pay | Admitting: Family Medicine

## 2024-03-28 ENCOUNTER — Other Ambulatory Visit (INDEPENDENT_AMBULATORY_CARE_PROVIDER_SITE_OTHER)

## 2024-03-28 DIAGNOSIS — R519 Headache, unspecified: Secondary | ICD-10-CM

## 2024-03-28 LAB — SEDIMENTATION RATE: Sed Rate: 5 mm/h (ref 0–20)

## 2024-04-02 ENCOUNTER — Other Ambulatory Visit

## 2024-04-11 DIAGNOSIS — G4452 New daily persistent headache (NDPH): Secondary | ICD-10-CM | POA: Insufficient documentation

## 2024-04-17 NOTE — Telephone Encounter (Signed)
 Called pt and he has accepted appt that was offered. Pt has been scheduled.

## 2024-04-24 ENCOUNTER — Ambulatory Visit (INDEPENDENT_AMBULATORY_CARE_PROVIDER_SITE_OTHER): Admitting: Adult Health

## 2024-04-24 ENCOUNTER — Ambulatory Visit: Admitting: Adult Health

## 2024-04-24 ENCOUNTER — Encounter: Payer: Self-pay | Admitting: Adult Health

## 2024-04-24 VITALS — BP 126/80 | HR 75 | Ht 75.0 in | Wt 278.4 lb

## 2024-04-24 DIAGNOSIS — G4452 New daily persistent headache (NDPH): Secondary | ICD-10-CM | POA: Diagnosis not present

## 2024-04-24 DIAGNOSIS — G4733 Obstructive sleep apnea (adult) (pediatric): Secondary | ICD-10-CM | POA: Diagnosis not present

## 2024-04-24 NOTE — Progress Notes (Unsigned)
 "   PATIENT: Scott [name] DOB: 03-19-1961  REASON FOR VISIT: follow up HISTORY FROM: patient   Chief Complaint  Patient presents with   RM4/CPAP    Pt is here Alone. Pt states that he has had a consistent headache for 2 months. Pt states that his left eye feels likes he has pressure behind his eye as well as having some eye twitching.        HISTORY OF PRESENT ILLNESS: Today 04/24/24:  Scott [name] is a 64 y.o. male with a history of obstructive sleep apnea on CPAP and headaches. returns today for follow-up.  Patient CPAP download showed good compliance and treatment of his apnea.  Patient states that he continues to have a daily headache.  Typically wakes up with a headache some days it stays minimal other days it progresses to a 7 out of 10.  Typically occurs behind the left eye has noticed some eyelid twitching as well.  Sometimes the headache radiates to the occipital region on the left side.  He denies visual changes.  Denies photophobia, phonophobia, nausea and vomiting.  Denies tinnitus. he notices on the weekend the headache does not seem to be as bad.  He currently works at [employer] and is in front of a computer most of the day.  He does have an appointment already to see friends optometrist for an evaluation.  He has seen ENT and they did not feel like there is any sinus related issues that could be contributing to his headaches.  Patient has also followed with his PCP and has had blood work to rule out temporal arteritis.  MRI was relatively unremarkable.  He returns today for an evaluation.  Location: Left Frontal region  Frequency: daily  Aura: no Photophonia:no Phonophobia:no Nausea: no Vomiting: no Numbness: no Weakness: no Visual changes:no Gait and balance: no    Imaging: MRI brain 2026 unremarkable  Cardiac history: no   03/12/24: Scott [name] is a 64 y.o. male with a history of obstructive sleep apnea on CPAP. Returns today for  follow-up.  He reports that the CPAP is working well for him.  He denies any new issues.  He currently wears the nasal mask.  He also has a ResMed mini that he uses when traveling.  He wears the nasal pillows for that machine.  Patient reports new symptom today.  Reports approximately 3 weeks ago he began having a daily persistent headache.  He states that he wakes up with a headache typically located above the left eye can spread to the center of his head.  As the day goes on his headache will increase in severity to a 7 or 8 out of 10.  Typically when he wakes up it is only a 1 or 2 out of 10.  He states he has noticed that his work tends to exacerbate the headache.  When he is off work the headache tends to stay around 1 or 2.  Denies any other migrainous features with his headaches.  He did have nosebleeds in October and had cauterization.  He did see his primary care who ordered an MRI however he states that they were unable to schedule until January 5.  He would like to be seen sooner if possible.  His wife also reports that he continues to have trouble remembering conversations in the evenings.  She states typically between 7 and 10 if they have a conversation he typically does not remember the next day.  He has had neuropsychological testing in the past that was unremarkable.  Minimal memory disturbance during the day.  Location: above left eye- can spread to center Frequency: daily since about 3 weeks ago Duration:  Aura: no Photophonia:no Phonophobia:no Nausea: no Vomiting: no Numbness: no Weakness: no Visual changes:no Gait and balance: no Memory: yes    Imaging:   Cardiac history: no       02/17/23: Scott [name] is a 64 y.o. male with a history of OSA on CPAP. Returns today for follow-up. He reports that CPAP is working well. He does travel a lot and may be interested in trying inspire device. His DL is below.        REVIEW OF SYSTEMS: Out of a complete 14  system review of symptoms, the patient complains only of the following symptoms, and all other reviewed systems are negative.   ESS 1  ALLERGIES: No Known Allergies  HOME MEDICATIONS: Outpatient Medications Prior to Visit  Medication Sig Dispense Refill   Ascorbic Acid (VITAMIN C) 1000 MG tablet Take 1,000 mg by mouth. Takes occassionally     rosuvastatin  (CRESTOR ) 40 MG tablet Take 1 tablet (40 mg total) by mouth daily. 90 tablet 3   tadalafil  (CIALIS ) 5 MG tablet Take 1 tablet (5 mg total) by mouth daily as needed. 30 tablet 11   valACYclovir  (VALTREX ) 500 MG tablet Take 1 tablet (500 mg total) by mouth 2 (two) times daily. For 3 days at time of flair. (Patient taking differently: Take 500 mg by mouth 2 (two) times daily as needed. For 3 days at time of flair.) 24 tablet 3   VITAMIN D , CHOLECALCIFEROL , PO Take by mouth.     fluticasone (FLONASE) 50 MCG/ACT nasal spray Place 2 sprays into both nostrils daily.     No facility-administered medications prior to visit.    PAST MEDICAL HISTORY: Past Medical History:  Diagnosis Date   Arthritis    Asperger's syndrome    Never formally diagnosed - potential traits of   Cataract    Herpes simplex type II infection    Hyperlipidemia    Malignant neoplasm of prostate (HCC)    Multiple lipomas    Premature ejaculation 07/2004   Pulmonary embolism (HCC) 04/2009   Sleep apnea    Unspecified vitamin D  deficiency     PAST SURGICAL HISTORY: Past Surgical History:  Procedure Laterality Date   CATARACT EXTRACTION, BILATERAL     EYE SURGERY N/A    [employer] 02/01/2020   Hip repalcement  2012   JOINT REPLACEMENT N/A    [employer] 02/01/2020   KNEE ARTHROSCOPY W/ MENISCAL REPAIR Left 12/2014   PROSTATE SURGERY N/A    [facility] 02/01/2020   replacment     Lt hip Feb 2022   SHOULDER OPEN ROTATOR CUFF REPAIR     XI ROBOTIC ASSISTED SIMPLE PROSTATECTOMY  05/21/2009    FAMILY HISTORY: Family History  Problem Relation Age of Onset    Diabetes Mother    Hypertension Mother    Rheum arthritis Mother    Arthritis Mother    Cancer Mother    Obesity Mother    Cancer Father    Heart failure Father        heart trouble   Cancer Sister    Heart disease Other    Hyperlipidemia Other    Diabetes Other    Sleep apnea Neg Hx    Headache Neg Hx    Migraines Neg Hx     SOCIAL  HISTORY: Social History   Socioeconomic History   Marital status: Married    Spouse name: Vaunda   Number of children: 2   Years of education: 18   Highest education level: Master's degree (e.g., MA, MS, MEng, MEd, MSW, MBA)  Occupational History   Not on file  Tobacco Use   Smoking status: Light Smoker    Types: Cigars   Smokeless tobacco: Never   Tobacco comments:    Smokes cigar when playing golf quit smoking cigarettes in 2004   Vaping Use   Vaping status: Never Used  Substance and Sexual Activity   Alcohol use: Yes    Alcohol/week: 2.0 standard drinks of alcohol    Types: 2 Cans of beer per week    Comment: occasionally   Drug use: No   Sexual activity: Yes  Other Topics Concern   Not on file  Social History Narrative   Patient does not live alone    Patient is working currently    Social Drivers of Health   Tobacco Use: High Risk (04/24/2024)   Patient History    Smoking Tobacco Use: Light Smoker    Smokeless Tobacco Use: Never    Passive Exposure: Not on file  Financial Resource Strain: Low Risk (03/08/2024)   Overall Financial Resource Strain (CARDIA)    Difficulty of Paying Living Expenses: Not hard at all  Food Insecurity: No Food Insecurity (03/08/2024)   Epic    Worried About Radiation Protection Practitioner of Food in the Last Year: Never true    Ran Out of Food in the Last Year: Never true  Transportation Needs: No Transportation Needs (03/08/2024)   Epic    Lack of Transportation (Medical): No    Lack of Transportation (Non-Medical): No  Physical Activity: Insufficiently Active (03/08/2024)   Exercise Vital Sign    Days of  Exercise per Week: 4 days    Minutes of Exercise per Session: 30 min  Stress: No Stress Concern Present (03/08/2024)   [employer] of Occupational Health - Occupational Stress Questionnaire    Feeling of Stress: Not at all  Social Connections: Moderately Isolated (03/08/2024)   Social Connection and Isolation Panel    Frequency of Communication with Friends and Family: Patient declined    Frequency of Social Gatherings with Friends and Family: More than three times a week    Attends Religious Services: Patient declined    Active Member of Clubs or Organizations: No    Attends Engineer, Structural: Not on file    Marital Status: Married  Catering Manager Violence: Not on file  Depression (PHQ2-9): Low Risk (02/29/2024)   Depression (PHQ2-9)    PHQ-2 Score: 0  Alcohol Screen: Low Risk (03/08/2024)   Alcohol Screen    Last Alcohol Screening Score (AUDIT): 3  Housing: Unknown (03/08/2024)   Epic    Unable to Pay for Housing in the Last Year: No    Number of Times Moved in the Last Year: Not on file    Homeless in the Last Year: No  Utilities: Not on file  Health Literacy: Not on file      PHYSICAL EXAM  Vitals:   04/24/24 1120  BP: 126/80  Pulse: 75  Weight: 278 lb 6.4 oz (126.3 kg)  Height: 6' 3 (1.905 m)    Body mass index is 34.8 kg/m.      No data to display             No data to display  Generalized: Well developed, in no acute distress  Chest: Lungs clear to auscultation bilaterally    Neurological examination  Mentation: Alert oriented to time, place, history taking. Follows all commands speech and language fluent Cranial nerve II-XII: Pupils were equal round reactive to light extraocular movements were full, visual field were full on confrontational test. Facial sensation and strength were normal. Uvula tongue midline. Head turning and shoulder shrug  were normal and symmetric.Tongue protrusion into cheek strength was  normal. Motor: The motor testing reveals 5 over 5 strength of all 4 extremities. Good symmetric motor tone is noted throughout.  Sensory: Sensory testing is intact to  soft touch on all 4 extremities. No evidence of extinction is noted.  Coordination: Cerebellar testing reveals good finger-nose-finger and heel-to-shin bilaterally.  Gait and station: Gait is normal.  Reflexes: Deep tendon reflexes are symmetric and normal bilaterally. Toes are downgoing bilaterally.      DIAGNOSTIC DATA (LABS, IMAGING, TESTING) - I reviewed patient records, labs, notes, testing and imaging myself where available.  Lab Results  Component Value Date   WBC 5.3 02/29/2024   HGB 14.7 02/29/2024   HCT 43.5 02/29/2024   MCV 95.3 02/29/2024   PLT 147.0 (L) 02/29/2024      Component Value Date/Time   NA 140 02/29/2024 0916   NA 140 04/25/2020 1156   K 4.6 02/29/2024 0916   CL 103 02/29/2024 0916   CO2 28 02/29/2024 0916   GLUCOSE 96 02/29/2024 0916   BUN 14 02/29/2024 0916   BUN 9 04/25/2020 1156   CREATININE 0.82 02/29/2024 0916   CREATININE 0.86 07/10/2015 1056   CALCIUM  9.6 02/29/2024 0916   PROT 7.1 02/29/2024 0916   PROT 7.0 04/25/2020 1156   ALBUMIN 4.7 02/29/2024 0916   ALBUMIN 4.6 04/25/2020 1156   AST 22 02/29/2024 0916   ALT 20 02/29/2024 0916   ALKPHOS 67 02/29/2024 0916   BILITOT 0.8 02/29/2024 0916   BILITOT 0.6 04/25/2020 1156   GFRNONAA 89 04/25/2020 1156   GFRNONAA >89 07/10/2015 1056   GFRAA 103 04/25/2020 1156   GFRAA >89 07/10/2015 1056   Lab Results  Component Value Date   CHOL 195 02/29/2024   HDL 48.30 02/29/2024   LDLCALC 131 (H) 02/29/2024   TRIG 82.0 02/29/2024   CHOLHDL 4 02/29/2024   Lab Results  Component Value Date   HGBA1C 5.5 02/29/2024   No results found for: VITAMINB12 Lab Results  Component Value Date   TSH 1.80 02/21/2023      ASSESSMENT AND PLAN 64 y.o. year old male  has a past medical history of Arthritis, Asperger's syndrome,  Cataract, Herpes simplex type II infection, Hyperlipidemia, Malignant neoplasm of prostate (HCC), Multiple lipomas, Premature ejaculation (07/2004), Pulmonary embolism (HCC) (04/2009), Sleep apnea, and Unspecified vitamin D  deficiency. here with:  OSA on CPAP  - CPAP compliance excellent - Good treatment of AHI  - Encourage patient to use CPAP nightly and > 4 hours each night  2.  New daily persistent headache  -workup has been unremarkable - he will follow-up with optometrist tomorrow.  Have asked for this report to be sent to our office. -We will also do an overnight pulse oximetry to rule out hypoxemia while on CPAP as this could be contributing to the morning headaches. -If workup continues to be unremarkable we did discuss potential medication options such as Topamax or gabapentin .    Duwaine Russell, MSN, NP-C 04/24/2024, 11:27 AM [facility] [address], [address] [place], [place]  [address] [phone]  The patient's condition requires frequent monitoring and adjustments in the treatment plan, reflecting the ongoing complexity of care.  This provider is the continuing focal point for all needed services for this condition.   "

## 2024-04-24 NOTE — Progress Notes (Unsigned)
 Scott [name]

## 2024-04-26 ENCOUNTER — Telehealth: Payer: Self-pay | Admitting: *Deleted

## 2024-04-26 NOTE — Telephone Encounter (Signed)
-----   Message from Duwaine Russell, NP sent at 04/26/2024  8:28 AM EST ----- Please send order for ONO

## 2024-04-26 NOTE — Progress Notes (Signed)
 I reviewed the above note and documentation by the Nurse Practitioner and agree with the history, exam, assessment and plan as outlined above. I was available for consultation. Debbra Fairy, MD, PhD [facility])

## 2024-04-26 NOTE — Patient Instructions (Signed)
 Your Plan:  Overnight oxygen test ordered- DME will contact you Send report from eye visit May consider medication such as topamax or gabapentin       Thank you for coming to see us  at [facility]. I hope we have been able to provide you high quality care today.  You may receive a patient satisfaction survey over the next few weeks. We would appreciate your feedback and comments so that we may continue to improve ourselves and the health of our patients.

## 2024-04-26 NOTE — Telephone Encounter (Signed)
 Scott [name],   [place] that your exam was relatively normal. Prednisone  is ok to try- it can sometimes break the cycle of an ongoing headache. You can try that- if it doesn't work then we can discuss the meds I mentioned or you could give the glasses a try. As I mentioned in the visit- your headache doesn't sound like a true migraine. So perhaps eye alignment could be contributing. It may be worthwhile to try the glasses to see if it helps. That would keep you from having to take medication.   We will still proceed with overnight oxygen test to ensure that is not causing the headache.   Let me know if this answered your question and certainly if you have any other questions please let me know.   Susumu Hackler

## 2024-08-29 ENCOUNTER — Ambulatory Visit: Admitting: Family Medicine

## 2024-10-15 ENCOUNTER — Ambulatory Visit: Admitting: Adult Health
